# Patient Record
Sex: Female | Born: 1990 | Hispanic: Refuse to answer | Marital: Single | Smoking: Former smoker
Health system: Southern US, Community
[De-identification: ages and names within clinical notes are randomized; demographics above are authoritative.]

## PROBLEM LIST (undated history)

## (undated) DIAGNOSIS — J45909 Unspecified asthma, uncomplicated: Secondary | ICD-10-CM

## (undated) DIAGNOSIS — M797 Fibromyalgia: Secondary | ICD-10-CM

## (undated) DIAGNOSIS — F988 Other specified behavioral and emotional disorders with onset usually occurring in childhood and adolescence: Secondary | ICD-10-CM

## (undated) HISTORY — PX: NO PAST SURGERIES: SHX2092

---

## 2019-10-31 ENCOUNTER — Encounter: Payer: Self-pay | Admitting: Emergency Medicine

## 2019-10-31 ENCOUNTER — Ambulatory Visit
Admission: EM | Admit: 2019-10-31 | Discharge: 2019-10-31 | Disposition: A | Discharge status: Home / Self Care | Payer: Self-pay | Attending: Emergency Medicine | Admitting: Emergency Medicine

## 2019-10-31 ENCOUNTER — Other Ambulatory Visit: Payer: Self-pay

## 2019-10-31 DIAGNOSIS — S0502XA Injury of conjunctiva and corneal abrasion without foreign body, left eye, initial encounter: Secondary | ICD-10-CM

## 2019-10-31 HISTORY — DX: Fibromyalgia: M79.7

## 2019-10-31 MED ORDER — ERYTHROMYCIN 5 MG/GM OP OINT: TOPICAL_OINTMENT | OPHTHALMIC | 0 refills | Status: DC

## 2019-10-31 NOTE — ED Triage Notes (Signed)
Seen  by provider only

## 2019-10-31 NOTE — Discharge Instructions (Addendum)
Use eyedrops as directed on eye(s) as prescribed.  May use artificial tear gel/drops at needed. °Important to use artificial tear gel/drops last and wait 10-15 minutes between drops as it can prevent your prescription drops from working properly.  °Important to follow up with Ophthalmology (eye doctor). °Return sooner for worsening of symptoms, change in vision, sensitivity to light, eye swelling, painful eye movement, or fever.  °

## 2019-10-31 NOTE — ED Provider Notes (Signed)
[facility]    CSN: [id number] Arrival date & time: 10/31/19  1959      History   Chief Complaint Chief Complaint  Patient presents with  . Eye Problem    HPI Eileen Abbott is a 29 y.o. female resenting for left eye pain and irritation, tearing.  Concern for pinkeye given redness.  Has had prolonged exposure with her family members dog, to which she is allergic.  Denies foreign body sensation or exposure.  Does have mild photophobia; no nausea, vomiting, change in vision.  Has not tried thing for this.  Contact lenses: Last use a week ago.    Past Medical History:  Diagnosis Date  . Fibromyalgia     There are no problems to display for this patient.   History reviewed. No pertinent surgical history.  OB History   No obstetric history on file.      Home Medications    Prior to Admission medications   Medication Sig Start Date End Date Taking? Authorizing Provider  DULOXETINE HCL PO Take by mouth.   Yes [provider]  erythromycin ophthalmic ointment Place a 1/2 inch ribbon of ointment into the lower eyelid. 10/31/19   Hall-Potvin, [place], PA-C    Family History History reviewed. No pertinent family history.  Social History Social History   Tobacco Use  . Smoking status: Not on file  Substance Use Topics  . Alcohol use: Not on file  . Drug use: Not on file     Allergies   Patient has no known allergies.   Review of Systems As per HPI   Physical Exam Triage Vital Signs ED Triage Vitals  Enc Vitals Group     BP      Pulse      Resp      Temp      Temp src      SpO2      Weight      Height      Head Circumference      Peak Flow      Pain Score      Pain Loc      Pain Edu?      Excl. in GC?    No data found.  Updated Vital Signs BP (!) 143/92 (BP Location: Left Arm)   Pulse 96   Temp 98.2 F (36.8 C) (Oral)   Resp 18   LMP 10/01/2019   SpO2 97%   Visual Acuity Right Eye Distance:   Left Eye  Distance:   Bilateral Distance:    Right Eye Near:   Left Eye Near:    Bilateral Near:     Physical Exam Constitutional:      General: She is not in acute distress. HENT:     Head: Normocephalic and atraumatic.  Eyes:     General: No scleral icterus.       Right eye: No discharge.        Left eye: Discharge present.    Extraocular Movements: Extraocular movements intact.     Pupils: Pupils are equal, round, and reactive to light.     Comments: Left eye with conjunctival injection, spares limbus.  Watery discharge.  No foreign body with lid eversion.  Fluorescein dye exam with small corneal abrasion mid orbit  Cardiovascular:     Rate and Rhythm: Normal rate.  Pulmonary:     Effort: Pulmonary effort is normal.  Skin:    Coloration: Skin is not jaundiced or  pale.  Neurological:     Mental Status: She is alert and oriented to person, place, and time.      UC Treatments / Results  Labs (all labs ordered are listed, but only abnormal results are displayed) Labs Reviewed - No data to display  EKG   Radiology No results found.  Procedures Procedures (including critical care time)  Medications Ordered in UC Medications - No data to display  Initial Impression / Assessment and Plan / UC Course  I have reviewed the triage vital signs and the nursing notes.  Pertinent labs & imaging results that were available during my care of the patient were reviewed by me and considered in my medical decision making (see chart for details).     H&P concerning regarding abrasion: We will treat with erythromycin, patient follow-up with ophthalmology tomorrow.  Return precautions discussed, pt verbalized understanding and is agreeable to plan. Final Clinical Impressions(s) / UC Diagnoses   Final diagnoses:  Abrasion of left cornea, initial encounter     Discharge Instructions     Use eyedrops as directed on eye(s) as prescribed.  May use artificial tear gel/drops at  needed. Important to use artificial tear gel/drops last and wait 10-15 minutes between drops as it can prevent your prescription drops from working properly.  Important to follow up with Ophthalmology (eye doctor). Return sooner for worsening of symptoms, change in vision, sensitivity to light, eye swelling, painful eye movement, or fever.     ED Prescriptions    Medication Sig Dispense Auth. Provider   erythromycin ophthalmic ointment Place a 1/2 inch ribbon of ointment into the lower eyelid. 3.5 g Hall-Potvin, [place], PA-C     PDMP not reviewed this encounter.   Hall-Potvin, [place], [place] 10/31/19 2056

## 2019-11-08 ENCOUNTER — Encounter (HOSPITAL_COMMUNITY): Payer: Self-pay | Admitting: Family Medicine

## 2019-11-08 ENCOUNTER — Inpatient Hospital Stay (HOSPITAL_COMMUNITY): Payer: Medicaid Other

## 2019-11-08 ENCOUNTER — Inpatient Hospital Stay (HOSPITAL_COMMUNITY)
Admission: AD | Admit: 2019-11-08 | Discharge: 2019-11-08 | Disposition: A | Discharge status: Home / Self Care | Payer: Medicaid Other | Attending: Family Medicine | Admitting: Family Medicine

## 2019-11-08 ENCOUNTER — Other Ambulatory Visit: Payer: Self-pay

## 2019-11-08 DIAGNOSIS — O26891 Other specified pregnancy related conditions, first trimester: Secondary | ICD-10-CM | POA: Diagnosis not present

## 2019-11-08 DIAGNOSIS — O3680X0 Pregnancy with inconclusive fetal viability, not applicable or unspecified: Secondary | ICD-10-CM

## 2019-11-08 DIAGNOSIS — R1013 Epigastric pain: Secondary | ICD-10-CM | POA: Diagnosis not present

## 2019-11-08 DIAGNOSIS — O99891 Other specified diseases and conditions complicating pregnancy: Secondary | ICD-10-CM | POA: Diagnosis not present

## 2019-11-08 DIAGNOSIS — M797 Fibromyalgia: Secondary | ICD-10-CM | POA: Insufficient documentation

## 2019-11-08 DIAGNOSIS — O219 Vomiting of pregnancy, unspecified: Secondary | ICD-10-CM | POA: Diagnosis not present

## 2019-11-08 DIAGNOSIS — Z3A09 9 weeks gestation of pregnancy: Secondary | ICD-10-CM | POA: Insufficient documentation

## 2019-11-08 DIAGNOSIS — Z6791 Unspecified blood type, Rh negative: Secondary | ICD-10-CM

## 2019-11-08 DIAGNOSIS — Z79899 Other long term (current) drug therapy: Secondary | ICD-10-CM | POA: Insufficient documentation

## 2019-11-08 DIAGNOSIS — O26899 Other specified pregnancy related conditions, unspecified trimester: Secondary | ICD-10-CM

## 2019-11-08 DIAGNOSIS — R109 Unspecified abdominal pain: Secondary | ICD-10-CM

## 2019-11-08 DIAGNOSIS — O99511 Diseases of the respiratory system complicating pregnancy, first trimester: Secondary | ICD-10-CM | POA: Insufficient documentation

## 2019-11-08 DIAGNOSIS — J45909 Unspecified asthma, uncomplicated: Secondary | ICD-10-CM | POA: Insufficient documentation

## 2019-11-08 HISTORY — DX: Unspecified asthma, uncomplicated: J45.909

## 2019-11-08 LAB — COMPREHENSIVE METABOLIC PANEL
ALT: 16 U/L (ref 0–44)
AST: 16 U/L (ref 15–41)
Albumin: 3.3 g/dL — ABNORMAL LOW (ref 3.5–5.0)
Alkaline Phosphatase: 35 U/L — ABNORMAL LOW (ref 38–126)
Anion gap: 8 (ref 5–15)
BUN: 10 mg/dL (ref 6–20)
CO2: 25 mmol/L (ref 22–32)
Calcium: 8.7 mg/dL — ABNORMAL LOW (ref 8.9–10.3)
Chloride: 105 mmol/L (ref 98–111)
Creatinine, Ser: 0.8 mg/dL (ref 0.44–1.00)
GFR calc Af Amer: >60 mL/min (ref >60)
GFR calc non Af Amer: >60 mL/min (ref >60)
Glucose, Bld: 90 mg/dL (ref 70–99)
Potassium: 3.6 mmol/L (ref 3.5–5.1)
Sodium: 138 mmol/L (ref 135–145)
Total Bilirubin: 0.5 mg/dL (ref 0.3–1.2)
Total Protein: 6 g/dL — ABNORMAL LOW (ref 6.5–8.1)

## 2019-11-08 LAB — POCT PREGNANCY, URINE: Preg Test, Ur: POSITIVE — AB

## 2019-11-08 LAB — CBC
HCT: 39.8 % (ref 36.0–46.0)
Hemoglobin: 13.2 g/dL (ref 12.0–15.0)
MCH: 30.3 pg (ref 26.0–34.0)
MCHC: 33.2 g/dL (ref 30.0–36.0)
MCV: 91.5 fL (ref 80.0–100.0)
Platelets: 223 10*3/uL (ref 150–400)
RBC: 4.35 MIL/uL (ref 3.87–5.11)
RDW: 12.3 % (ref 11.5–15.5)
WBC: 8.7 10*3/uL (ref 4.0–10.5)
nRBC: 0 % (ref 0.0–0.2)

## 2019-11-08 LAB — URINALYSIS, ROUTINE W REFLEX MICROSCOPIC
Bilirubin Urine: NEGATIVE
Glucose, UA: NEGATIVE mg/dL
Hgb urine dipstick: NEGATIVE
Ketones, ur: NEGATIVE mg/dL
Leukocytes,Ua: NEGATIVE
Nitrite: NEGATIVE
Protein, ur: NEGATIVE mg/dL
Specific Gravity, Urine: 1.019 (ref 1.005–1.030)
pH: 7 (ref 5.0–8.0)

## 2019-11-08 LAB — WET PREP, GENITAL
Clue Cells Wet Prep HPF POC: NONE SEEN
Sperm: NONE SEEN
Trich, Wet Prep: NONE SEEN
Yeast Wet Prep HPF POC: NONE SEEN

## 2019-11-08 LAB — HCG, QUANTITATIVE, PREGNANCY: hCG, Beta Chain, Quant, S: 972 m[IU]/mL — ABNORMAL HIGH (ref <5)

## 2019-11-08 LAB — ABO/RH: ABO/RH(D): B NEG

## 2019-11-08 MED ORDER — PROMETHAZINE HCL 12.5 MG PO TABS: 12.5000 mg | ORAL_TABLET | Freq: Four times a day (QID) | ORAL | 0 refills | Status: DC | PRN

## 2019-11-08 MED ORDER — DOXYLAMINE SUCCINATE (SLEEP) 25 MG PO TABS: 25.0000 mg | ORAL_TABLET | Freq: Three times a day (TID) | ORAL | 0 refills | Status: DC | PRN

## 2019-11-08 MED ORDER — PYRIDOXINE HCL 25 MG PO TABS: 25.0000 mg | ORAL_TABLET | Freq: Three times a day (TID) | ORAL | 0 refills | Status: DC

## 2019-11-08 NOTE — MAU Note (Signed)
.   Annelle B Feldmeier is a 29 y.o. at [redacted]w[redacted]d here in MAU reporting: she has been having N/V and pain in her right lower abdomen on and off. Denies any VB LMP: 09/01/19 Onset of complaint: ongoing Pain score: 6 Vitals:   11/08/19 1146  BP: 126/88  Pulse: 85  Resp: 16  Temp: 98.4 F (36.9 C)  SpO2: 100%     FHT: Lab orders placed from triage: UPT/UA

## 2019-11-08 NOTE — MAU Provider Note (Signed)
History     CSN: [id number]  Arrival date and time: 11/08/19 1053  First Provider Initiated Contact with Patient 11/08/19 1225      Chief Complaint  Patient presents with  . Possible Pregnancy  . Emesis  . Abdominal Pain   HPI Eileen Abbott is a 29 y.o. G1P0 at [redacted]w[redacted]d by uncertain LMP who presents to MAU with chief complaint of epigastric and right mid abdominal pain. These are recurrent problems, onset within the past two weeks. She rates her pain as 6/10, non-radiating. She denies aggravating or alleviating factors. She has not taken medication or tried other treatments for this complaint. She declines pain medication in MAU.  Patient also c/o nausea and vomiting. She can intermittently tolerate PO intake but vomits each day. She has not taken medication or tried other treatments for this complaint.   She denies vaginal bleeding, abdominal tenderness, dysuria, fever or recent illness.  OB History    Gravida  1   Para      Term      Preterm      AB      Living        SAB      TAB      Ectopic      Multiple      Live Births              Past Medical History:  Diagnosis Date  . Asthma   . Fibromyalgia     History reviewed. No pertinent surgical history.  History reviewed. No pertinent family history.  Social History   Tobacco Use  . Smoking status: Never Smoker  . Smokeless tobacco: Never Used  Substance Use Topics  . Alcohol use: Not on file  . Drug use: Yes    Types: Marijuana    Allergies:  Allergies  Allergen Reactions  . Banana Nausea And Vomiting  . Eggs Or Egg-Derived Products Nausea And Vomiting    Medications Prior to Admission  Medication Sig Dispense Refill Last Dose  . DULOXETINE HCL PO Take by mouth.   11/07/2019 at Unknown time  . folic acid (FOLVITE) 1 MG tablet Take 1 mg by mouth daily.   11/07/2019 at Unknown time  . Prenatal Vit-Fe Fumarate-FA (PRENATAL MULTIVITAMIN) TABS tablet Take 1 tablet by mouth daily at 12  noon.   11/07/2019 at Unknown time  . erythromycin ophthalmic ointment Place a 1/2 inch ribbon of ointment into the lower eyelid. 3.5 g 0     Review of Systems  Gastrointestinal: Positive for abdominal pain, nausea and vomiting.  Genitourinary: Negative for vaginal bleeding.  Musculoskeletal: Negative for back pain.  All other systems reviewed and are negative.  Physical Exam   Blood pressure 126/88, pulse 85, temperature 98.4 F (36.9 C), resp. rate 16, height 5' (1.524 m), weight 55.8 kg, last menstrual period 09/01/2019, SpO2 100 %.  Physical Exam Vitals and nursing note reviewed. Exam conducted with a chaperone present.  Cardiovascular:     Rate and Rhythm: Normal rate.     Heart sounds: Normal heart sounds.  Pulmonary:     Effort: Pulmonary effort is normal.     Breath sounds: Normal breath sounds.  Abdominal:     General: Abdomen is flat.     Palpations: Abdomen is soft.     Tenderness: There is no abdominal tenderness. There is no right CVA tenderness or left CVA tenderness.  Genitourinary:    Comments: Speculum exam deferred due to absence of bleeding  Skin:    General: Skin is warm and dry.  Neurological:     Mental Status: She is alert.     MAU Course  Procedures   Patient Vitals for the past 24 hrs:  BP Temp Pulse Resp SpO2 Height Weight  11/08/19 1358 117/89 -- 84 16 -- -- --  11/08/19 1146 126/88 98.4 F (36.9 C) 85 16 100 % 5' (1.524 m) 55.8 kg   Results for orders placed or performed during the hospital encounter of 11/08/19 (from the past 24 hour(s))  Pregnancy, urine POC     Status: Abnormal   Collection Time: 11/08/19 11:21 AM  Result Value Ref Range   Preg Test, Ur POSITIVE (A) NEGATIVE  Urinalysis, Routine w reflex microscopic Urine, Clean Catch     Status: Abnormal   Collection Time: 11/08/19 11:22 AM  Result Value Ref Range   Color, Urine YELLOW YELLOW   APPearance HAZY (A) CLEAR   Specific Gravity, Urine 1.019 1.005 - 1.030   pH 7.0 5.0 -  8.0   Glucose, UA NEGATIVE NEGATIVE mg/dL   Hgb urine dipstick NEGATIVE NEGATIVE   Bilirubin Urine NEGATIVE NEGATIVE   Ketones, ur NEGATIVE NEGATIVE mg/dL   Protein, ur NEGATIVE NEGATIVE mg/dL   Nitrite NEGATIVE NEGATIVE   Leukocytes,Ua NEGATIVE NEGATIVE  CBC     Status: None   Collection Time: 11/08/19 12:43 PM  Result Value Ref Range   WBC 8.7 4.0 - 10.5 K/uL   RBC 4.35 3.87 - 5.11 MIL/uL   Hemoglobin 13.2 12.0 - 15.0 g/dL   HCT 14.4 36 - 46 %   MCV 91.5 80.0 - 100.0 fL   MCH 30.3 26.0 - 34.0 pg   MCHC 33.2 30.0 - 36.0 g/dL   RDW 81.8 56.3 - 14.9 %   Platelets 223 150 - 400 K/uL   nRBC 0.0 0.0 - 0.2 %  Comprehensive metabolic panel     Status: Abnormal   Collection Time: 11/08/19 12:43 PM  Result Value Ref Range   Sodium 138 135 - 145 mmol/L   Potassium 3.6 3.5 - 5.1 mmol/L   Chloride 105 98 - 111 mmol/L   CO2 25 22 - 32 mmol/L   Glucose, Bld 90 70 - 99 mg/dL   BUN 10 6 - 20 mg/dL   Creatinine, Ser 7.02 0.44 - 1.00 mg/dL   Calcium 8.7 (L) 8.9 - 10.3 mg/dL   Total Protein 6.0 (L) 6.5 - 8.1 g/dL   Albumin 3.3 (L) 3.5 - 5.0 g/dL   AST 16 15 - 41 U/L   ALT 16 0 - 44 U/L   Alkaline Phosphatase 35 (L) 38 - 126 U/L   Total Bilirubin 0.5 0.3 - 1.2 mg/dL   GFR calc non Af Amer >60 >60 mL/min   GFR calc Af Amer >60 >60 mL/min   Anion gap 8 5 - 15  ABO/Rh     Status: None   Collection Time: 11/08/19 12:43 PM  Result Value Ref Range   ABO/RH(D)      B NEG Performed at [facility], [address] [place], [place] [address]   hCG, quantitative, pregnancy     Status: Abnormal   Collection Time: 11/08/19 12:43 PM  Result Value Ref Range   hCG, Beta Chain, Quant, S 972 (H) <5 mIU/mL  Wet prep, genital     Status: Abnormal   Collection Time: 11/08/19  1:01 PM   Specimen: Vaginal  Result Value Ref Range   Yeast Wet  Prep HPF POC NONE SEEN NONE SEEN   Trich, Wet Prep NONE SEEN NONE SEEN   Clue Cells Wet Prep HPF POC NONE SEEN NONE SEEN   WBC, Wet Prep HPF POC FEW (A)  NONE SEEN   Sperm NONE SEEN    US OB LESS THAN 14 WEEKS WITH OB TRANSVAGINAL  Result Date: 11/08/2019 CLINICAL DATA:  First trimester pregnancy, abdominal cramping, LMP 09/01/2019 EXAM: OBSTETRIC <14 WK [place] AND TRANSVAGINAL OB US TECHNIQUE: Both transabdominal and transvaginal ultrasound examinations were performed for complete evaluation of the gestation as well as the maternal uterus, adnexal regions, and pelvic cul-de-sac. Transvaginal technique was performed to assess early pregnancy. COMPARISON:  None FINDINGS: Intrauterine gestational sac: Present, single, tiny Yolk sac:  Not identified Embryo:  Not identified Cardiac Activity: N/A Heart Rate: N/A  bpm MSD: 2.5 mm   4 w   6 d Subchorionic hemorrhage:  None visualized. Maternal uterus/adnexae: RIGHT ovary measures 2.8 x 4.1 x 2.4 cm and contains a small corpus luteum. LEFT ovary normal size and morphology 3.6 x 1.6 x 1.7 cm. Maternal uterus anteverted and otherwise unremarkable. No free pelvic fluid or adnexal masses. IMPRESSION: Tiny gestational sac in uterus with mean sac diameter corresponding to 4 weeks 6 days EGA. No fetal pole identified to establish viability; if clinically indicated, follow-up imaging can be performed in 14 days to assess viability. Electronically Signed   By: Ulyses Southward M.D.   On: 11/08/2019 13:32   Meds ordered this encounter  Medications  . pyridOXINE (VITAMIN B-6) 25 MG tablet    Sig: Take 1 tablet (25 mg total) by mouth every 8 (eight) hours.    Dispense:  30 tablet    Refill:  0    Order Specific Question:   Supervising Provider    Answer:   Reva Bores [[id number]]  . doxylamine, Sleep, (UNISOM) 25 MG tablet    Sig: Take 1 tablet (25 mg total) by mouth every 8 (eight) hours as needed.    Dispense:  30 tablet    Refill:  0    Order Specific Question:   Supervising Provider    Answer:   Reva Bores [[id number]]  . promethazine (PHENERGAN) 12.5 MG tablet    Sig: Take 1 tablet (12.5 mg total) by mouth every 6 (six)  hours as needed for nausea or vomiting. For vomiting not relieved by B6 and Unisom    Dispense:  30 tablet    Refill:  0    Order Specific Question:   Supervising Provider    Answer:   Reva Bores [[id number]]    Assessment and Plan  --29 y.o. G1P0 with pregnancy of unknown location --Quant hCG 972 --Rh NEG, not bleeding, Rhogam not indicated --New rx antiemetics --Discharge home in stable condition with ectopic precautions  F/U: --Patient to return to MAU Sunday 08/29 for Stat Quant hCG  Calvert Cantor, CNM 11/08/2019, 4:19 PM

## 2019-11-08 NOTE — Discharge Instructions (Signed)
Human Chorionic Gonadotropin Test Why am I having this test? A human chorionic gonadotropin (hCG) test is done to determine whether you are pregnant. It can also be used:  To diagnose an abnormal pregnancy.  To determine whether you have had a failed pregnancy (miscarriage) or are at risk of one. What is being tested? This test checks the level of the human chorionic gonadotropin (hCG) hormone in the blood. This hormone is produced during pregnancy by the cells that form the placenta. The placenta is the organ that grows inside your womb (uterus) to nourish a developing baby. When you are pregnant, hCG can be detected in your blood or urine 7 to 8 days before your missed period. It continues to go up for the first 8-10 weeks of pregnancy. The presence of hCG in your blood can be measured with several different types of tests. You may have:  A urine test. ? Because this hormone is eliminated from your body by your kidneys, you may have a urine test to find out whether you are pregnant. A home pregnancy test detects whether there is hCG in your urine. ? A urine test only shows whether there is hCG in your urine. It does not measure how much.  A qualitative blood test. ? You may have this type of blood test to find out if you are pregnant. ? This blood test only shows whether there is hCG in your blood. It does not measure how much.  A quantitative blood test. ? This type of blood test measures the amount of hCG in your blood. ? You may have this test to:  Diagnose an abnormal pregnancy.  Check whether you have had a miscarriage.  Determine whether you are at risk of a miscarriage. What kind of sample is taken?     Two kinds of samples may be collected to test for the hCG hormone.  Blood. It is usually collected by inserting a needle into a blood vessel.  Urine. It is usually collected by urinating into a germ-free (sterile) specimen cup. It is best to collect the sample the first  time you urinate in the morning. How do I prepare for this test? No preparation is needed for a blood test.  For the urine test:  Let your health care provider know about: ? All medicines you are taking, including vitamins, herbs, creams, and over-the-counter medicines. ? Any blood in your urine. This may interfere with the result.  Do not drink too much fluid. Drink as you normally would, or as directed by your health care provider. How are the results reported? Depending on the type of test that you have, your test results may be reported as values. Your health care provider will compare your results to normal ranges that were established after testing a large group of people (reference ranges). Reference ranges may vary among labs and hospitals. For this test, common reference ranges that show absence of pregnancy are:  Quantitative hCG blood levels: less than 5 IU/L. Other results will be reported as either positive or negative. For this test, normal results (meaning the absence of pregnancy) are:  Negative for hCG in the urine test.  Negative for hCG in the qualitative blood test. What do the results mean? Urine and qualitative blood test  A negative result could mean: ? That you are not pregnant. ? That the test was done too early in your pregnancy to detect hCG in your blood or urine. If you still have other signs   of pregnancy, the test will be repeated.  A positive result means: ? That you are most likely pregnant. Your health care provider may confirm your pregnancy with an imaging study (ultrasound) of your uterus, if needed. Quantitative blood test Results of the quantitative hCG blood test will be interpreted as follows:  Less than 5 IU/L: You are most likely not pregnant.  Greater than 25 IU/L: You are most likely pregnant.  hCG levels that are higher than expected: ? You are pregnant with twins. ? You have abnormal growths in the uterus.  hCG levels that are  rising more slowly than expected: ? You have an ectopic pregnancy (also called a tubal pregnancy).  hCG levels that are falling: ? You may be having a miscarriage. Talk with your health care provider about what your results mean. Questions to ask your health care provider Ask your health care provider, or the department that is doing the test:  When will my results be ready?  How will I get my results?  What are my treatment options?  What other tests do I need?  What are my next steps? Summary  A human chorionic gonadotropin test is done to determine whether you are pregnant.  When you are pregnant, hCG can be detected in your blood or urine 7 to 8 days before your missed period. It continues to go up for the first 8-10 weeks of pregnancy.  Your hCG level can be measured with different types of tests. You may have a urine test, a qualitative blood test, or a quantitative blood test.  Talk with your health care provider about what your results mean. This information is not intended to replace advice given to you by your health care provider. Make sure you discuss any questions you have with your health care provider. Document Revised: 01/30/2017 Document Reviewed: 01/30/2017 Elsevier Patient Education  2020 [employer]vier Inc.  

## 2019-11-11 ENCOUNTER — Inpatient Hospital Stay (HOSPITAL_COMMUNITY)
Admission: AD | Admit: 2019-11-11 | Discharge: 2019-11-11 | Disposition: A | Discharge status: Home / Self Care | Payer: Medicaid Other | Attending: Obstetrics and Gynecology | Admitting: Obstetrics and Gynecology

## 2019-11-11 DIAGNOSIS — O26891 Other specified pregnancy related conditions, first trimester: Secondary | ICD-10-CM | POA: Insufficient documentation

## 2019-11-11 DIAGNOSIS — Z3A01 Less than 8 weeks gestation of pregnancy: Secondary | ICD-10-CM | POA: Diagnosis not present

## 2019-11-11 DIAGNOSIS — Z3A00 Weeks of gestation of pregnancy not specified: Secondary | ICD-10-CM

## 2019-11-11 DIAGNOSIS — O3680X0 Pregnancy with inconclusive fetal viability, not applicable or unspecified: Secondary | ICD-10-CM | POA: Diagnosis not present

## 2019-11-11 DIAGNOSIS — R141 Gas pain: Secondary | ICD-10-CM | POA: Diagnosis not present

## 2019-11-11 LAB — HCG, QUANTITATIVE, PREGNANCY: hCG, Beta Chain, Quant, S: 5418 m[IU]/mL — ABNORMAL HIGH (ref <5)

## 2019-11-11 LAB — GC/CHLAMYDIA PROBE AMP (~~LOC~~) NOT AT ARMC
Chlamydia: NEGATIVE
Comment: NEGATIVE
Comment: NORMAL
Neisseria Gonorrhea: NEGATIVE

## 2019-11-11 NOTE — MAU Note (Signed)
Her for repeat BHCG. Pt reports has some gas pains yesterday. No pain today. Reports a lot of N/V on and off. No vag bleeding.

## 2019-11-11 NOTE — Discharge Instructions (Signed)
Human Chorionic Gonadotropin Test Why am I having this test? A human chorionic gonadotropin (hCG) test is done to determine whether you are pregnant. It can also be used:  To diagnose an abnormal pregnancy.  To determine whether you have had a failed pregnancy (miscarriage) or are at risk of one. What is being tested? This test checks the level of the human chorionic gonadotropin (hCG) hormone in the blood. This hormone is produced during pregnancy by the cells that form the placenta. The placenta is the organ that grows inside your womb (uterus) to nourish a developing baby. When you are pregnant, hCG can be detected in your blood or urine 7 to 8 days before your missed period. It continues to go up for the first 8-10 weeks of pregnancy. The presence of hCG in your blood can be measured with several different types of tests. You may have:  A urine test. ? Because this hormone is eliminated from your body by your kidneys, you may have a urine test to find out whether you are pregnant. A home pregnancy test detects whether there is hCG in your urine. ? A urine test only shows whether there is hCG in your urine. It does not measure how much.  A qualitative blood test. ? You may have this type of blood test to find out if you are pregnant. ? This blood test only shows whether there is hCG in your blood. It does not measure how much.  A quantitative blood test. ? This type of blood test measures the amount of hCG in your blood. ? You may have this test to:  Diagnose an abnormal pregnancy.  Check whether you have had a miscarriage.  Determine whether you are at risk of a miscarriage. What kind of sample is taken?     Two kinds of samples may be collected to test for the hCG hormone.  Blood. It is usually collected by inserting a needle into a blood vessel.  Urine. It is usually collected by urinating into a germ-free (sterile) specimen cup. It is best to collect the sample the first  time you urinate in the morning. How do I prepare for this test? No preparation is needed for a blood test.  For the urine test:  Let your health care provider know about: ? All medicines you are taking, including vitamins, herbs, creams, and over-the-counter medicines. ? Any blood in your urine. This may interfere with the result.  Do not drink too much fluid. Drink as you normally would, or as directed by your health care provider. How are the results reported? Depending on the type of test that you have, your test results may be reported as values. Your health care provider will compare your results to normal ranges that were established after testing a large group of people (reference ranges). Reference ranges may vary among labs and hospitals. For this test, common reference ranges that show absence of pregnancy are:  Quantitative hCG blood levels: less than 5 IU/L. Other results will be reported as either positive or negative. For this test, normal results (meaning the absence of pregnancy) are:  Negative for hCG in the urine test.  Negative for hCG in the qualitative blood test. What do the results mean? Urine and qualitative blood test  A negative result could mean: ? That you are not pregnant. ? That the test was done too early in your pregnancy to detect hCG in your blood or urine. If you still have other signs   of pregnancy, the test will be repeated.  A positive result means: ? That you are most likely pregnant. Your health care provider may confirm your pregnancy with an imaging study (ultrasound) of your uterus, if needed. Quantitative blood test Results of the quantitative hCG blood test will be interpreted as follows:  Less than 5 IU/L: You are most likely not pregnant.  Greater than 25 IU/L: You are most likely pregnant.  hCG levels that are higher than expected: ? You are pregnant with twins. ? You have abnormal growths in the uterus.  hCG levels that are  rising more slowly than expected: ? You have an ectopic pregnancy (also called a tubal pregnancy).  hCG levels that are falling: ? You may be having a miscarriage. Talk with your health care provider about what your results mean. Questions to ask your health care provider Ask your health care provider, or the department that is doing the test:  When will my results be ready?  How will I get my results?  What are my treatment options?  What other tests do I need?  What are my next steps? Summary  A human chorionic gonadotropin test is done to determine whether you are pregnant.  When you are pregnant, hCG can be detected in your blood or urine 7 to 8 days before your missed period. It continues to go up for the first 8-10 weeks of pregnancy.  Your hCG level can be measured with different types of tests. You may have a urine test, a qualitative blood test, or a quantitative blood test.  Talk with your health care provider about what your results mean. This information is not intended to replace advice given to you by your health care provider. Make sure you discuss any questions you have with your health care provider. Document Revised: 01/30/2017 Document Reviewed: 01/30/2017 Elsevier Patient Education  2020 [employer]vier Inc.  

## 2019-11-11 NOTE — MAU Provider Note (Signed)
First Provider Initiated Contact with Patient 11/11/19 1628     S Ms. Eileen Abbott is a 29 y.o. G1P0 female with pregnancy of unknown location who presents to MAU today for repeat stat Quant hCG. She denies pain or bleeding.   O BP 114/64   Pulse 92   Temp 98.1 F (36.7 C)   Resp 18   LMP 09/01/2019    Physical Exam Vitals and nursing note reviewed. Exam conducted with a chaperone present.  Cardiovascular:     Rate and Rhythm: Normal rate.     Pulses: Normal pulses.  Pulmonary:     Effort: Pulmonary effort is normal.  Skin:    General: Skin is warm and dry.     Capillary Refill: Capillary refill takes less than 2 seconds.  Psychiatric:        Mood and Affect: Mood normal.    A Medical screening exam complete Quant hCG 972 on 11/08/2019 Quant hCG today 5418, appropriate rise  P Discharge from MAU in stable condition Patient may return to MAU as needed for pregnancy related complaints  F/U: Repeat viability scan ordered for two weeks from now at [facility]  Clayton Bibles, [place] 11/11/2019 5:33 PM

## 2019-11-16 ENCOUNTER — Other Ambulatory Visit: Payer: Self-pay

## 2019-11-16 ENCOUNTER — Ambulatory Visit
Admission: EM | Admit: 2019-11-16 | Discharge: 2019-11-16 | Disposition: A | Discharge status: Home / Self Care | Payer: Medicaid Other | Attending: Physician Assistant | Admitting: Physician Assistant

## 2019-11-16 DIAGNOSIS — R059 Cough, unspecified: Secondary | ICD-10-CM

## 2019-11-16 DIAGNOSIS — J3489 Other specified disorders of nose and nasal sinuses: Secondary | ICD-10-CM

## 2019-11-16 DIAGNOSIS — J45901 Unspecified asthma with (acute) exacerbation: Secondary | ICD-10-CM

## 2019-11-16 DIAGNOSIS — Z1152 Encounter for screening for COVID-19: Secondary | ICD-10-CM | POA: Diagnosis not present

## 2019-11-16 MED ORDER — ALBUTEROL SULFATE (2.5 MG/3ML) 0.083% IN NEBU: 2.5000 mg | INHALATION_SOLUTION | Freq: Four times a day (QID) | RESPIRATORY_TRACT | 0 refills | Status: DC | PRN

## 2019-11-16 MED ORDER — AEROCHAMBER PLUS FLO-VU MEDIUM MISC
1.0000 | Freq: Once | Status: AC
  Administered 2019-11-16: 1

## 2019-11-16 MED ORDER — FLUTICASONE PROPIONATE 50 MCG/ACT NA SUSP: 2.0000 | Freq: Every day | NASAL | 0 refills | Status: DC

## 2019-11-16 NOTE — ED Triage Notes (Addendum)
Pt c/o productive cough with green sputum, fatigue,  sore throat, congestion, runny nose, SOB for approx 2 days.  Used albuterol inhaler at approx 1000 today without spacer.  Also c/o lower pelvic/abdominal pain for 2 days. Pt states she had telephone consult with OB regarding lower pelvic pain and light pink vaginal bleeding episode and was advised she was experiencing round ligament pain.  Pt states she only had one episode of light pink bleeding and it resolved quickly. Pt reports pelvic discomfort is improving.  Has been using vick's vapor rub and cough drops. Bilateral lung sounds with insp/exp wheezes. Denies fever, chills, n/v/d, CP, ear pain.

## 2019-11-16 NOTE — ED Provider Notes (Signed)
[facility]    CSN: [id number] Arrival date & time: 11/16/19  1038      History   Chief Complaint Chief Complaint  Patient presents with  . Cough  . Shortness of Breath    HPI Eileen Abbott is a 29 y.o. female.   29 year old female who is [redacted] weeks pregnant comes in for 2 day of URI symptoms.  Rhinorrhea, nasal congestion, sore throat, cough.  Denies fever, chills, body aches.  Has had low abdominal pain/pelvic pain that has been consulted by OB, and cleared for monitoring.  Nausea/vomiting related to pregnancy, controlled with promethazine.  Mild diarrhea that has since resolved.  Denies chest pain, loss of taste or smell.  States shortness of breath similar to asthma exacerbation, does not feel that she is getting sufficient dosage of albuterol inhaler without a spacer.     Past Medical History:  Diagnosis Date  . Asthma   . Fibromyalgia     Patient Active Problem List   Diagnosis Date Noted  . Rh negative status during pregnancy 11/08/2019  . Pregnancy of unknown anatomic location 11/08/2019    History reviewed. No pertinent surgical history.  OB History    Gravida  1   Para      Term      Preterm      AB      Living        SAB      TAB      Ectopic      Multiple      Live Births               Home Medications    Prior to Admission medications   Medication Sig Start Date End Date Taking? Authorizing Provider  folic acid (FOLVITE) 1 MG tablet Take 1 mg by mouth daily.   Yes [provider]  Prenatal Vit-Fe Fumarate-FA (PRENATAL MULTIVITAMIN) TABS tablet Take 1 tablet by mouth daily at 12 noon.   Yes [provider]  promethazine (PHENERGAN) 12.5 MG tablet Take 1 tablet (12.5 mg total) by mouth every 6 (six) hours as needed for nausea or vomiting. For vomiting not relieved by B6 and Unisom 11/08/19  Yes Weinhold, Samantha C, CNM  albuterol (PROVENTIL) (2.5 MG/3ML) 0.083% nebulizer solution Take 3 mLs (2.5 mg  total) by nebulization every 6 (six) hours as needed for wheezing or shortness of breath. 11/16/19   Cathie Hoops, Anneth Brunell V, PA-C  doxylamine, Sleep, (UNISOM) 25 MG tablet Take 1 tablet (25 mg total) by mouth every 8 (eight) hours as needed. 11/08/19   Clayton Bibles C, CNM  DULOXETINE HCL PO Take by mouth.    [provider]  erythromycin ophthalmic ointment Place a 1/2 inch ribbon of ointment into the lower eyelid. 10/31/19   Hall-Potvin, [place], PA-C  fluticasone (FLONASE) 50 MCG/ACT nasal spray Place 2 sprays into both nostrils daily. 11/16/19   Cathie Hoops, Tremaine Fuhriman V, PA-C  pyridOXINE (VITAMIN B-6) 25 MG tablet Take 1 tablet (25 mg total) by mouth every 8 (eight) hours. 11/08/19   Calvert Cantor, CNM    Family History History reviewed. No pertinent family history.  Social History Social History   Tobacco Use  . Smoking status: Never Smoker  . Smokeless tobacco: Never Used  Substance Use Topics  . Alcohol use: Not on file  . Drug use: Yes    Types: Marijuana     Allergies   Banana and Eggs or egg-derived products  Review of Systems Review of Systems  Reason unable to perform ROS: See HPI as above.     Physical Exam Triage Vital Signs ED Triage Vitals  Enc Vitals Group     BP 11/16/19 1214 106/72     Pulse Rate 11/16/19 1214 97     Resp 11/16/19 1214 18     Temp 11/16/19 1214 97.9 F (36.6 C)     Temp Source 11/16/19 1214 Oral     SpO2 11/16/19 1214 97 %     Weight --      Height --      Head Circumference --      Peak Flow --      Pain Score 11/16/19 1228 3     Pain Loc --      Pain Edu? --      Excl. in GC? --    No data found.  Updated Vital Signs BP 106/72 (BP Location: Left Arm)   Pulse 97   Temp 97.9 F (36.6 C) (Oral)   Resp 18   LMP 09/01/2019   SpO2 97%   Physical Exam Constitutional:      General: She is not in acute distress.    Appearance: Normal appearance. She is not ill-appearing, toxic-appearing or diaphoretic.  HENT:     Head:  Normocephalic and atraumatic.     Mouth/Throat:     Mouth: Mucous membranes are moist.     Pharynx: Oropharynx is clear. Uvula midline.  Cardiovascular:     Rate and Rhythm: Normal rate and regular rhythm.     Heart sounds: Normal heart sounds. No murmur heard.  No friction rub. No gallop.   Pulmonary:     Effort: Pulmonary effort is normal. No accessory muscle usage, prolonged expiration, respiratory distress or retractions.     Comments: Lungs clear to auscultation without adventitious lung sounds. Musculoskeletal:     Cervical back: Normal range of motion and neck supple.  Neurological:     General: No focal deficit present.     Mental Status: She is alert and oriented to person, place, and time.      UC Treatments / Results  Labs (all labs ordered are listed, but only abnormal results are displayed) Labs Reviewed  NOVEL CORONAVIRUS, NAA    EKG   Radiology No results found.  Procedures Procedures (including critical care time)  Medications Ordered in UC Medications  AeroChamber Plus Flo-Vu Medium MISC 1 each (has no administration in time range)    Initial Impression / Assessment and Plan / UC Course  I have reviewed the triage vital signs and the nursing notes.  Pertinent labs & imaging results that were available during my care of the patient were reviewed by me and considered in my medical decision making (see chart for details).    Patient afebrile, nontoxic.  Speaking in full sentences without difficulty.  No tachycardia.  Lungs clear to auscultation bilaterally with adventitious lung sounds.  Low suspicion for PE right now, and will have patient continue to monitor closely.  Will provide nebulizer machine/spacer for further management of asthma exacerbation.  Other symptomatic treatment discussed.  Otherwise Covid testing ordered, to quarantine until testing results return.  Strict return precautions given.  Patient expresses understanding and agrees to  plan.  Final Clinical Impressions(s) / UC Diagnoses   Final diagnoses:  Encounter for screening for COVID-19  Asthma with acute exacerbation, unspecified asthma severity, unspecified whether persistent  Rhinorrhea  Cough    ED Prescriptions  Medication Sig Dispense Auth. Provider   albuterol (PROVENTIL) (2.5 MG/3ML) 0.083% nebulizer solution Take 3 mLs (2.5 mg total) by nebulization every 6 (six) hours as needed for wheezing or shortness of breath. 75 mL Tanita Palinkas V, PA-C   fluticasone (FLONASE) 50 MCG/ACT nasal spray Place 2 sprays into both nostrils daily. 1 g Belinda Fisher, PA-C     PDMP not reviewed this encounter.   Belinda Fisher, PA-C 11/16/19 1258

## 2019-11-16 NOTE — Discharge Instructions (Signed)
COVID PCR testing ordered. I would like you to quarantine until testing results. Albuterol as needed for shortness of breath. Flonase for nasal congestion/drainage. Allergy medicine such as zyrtec/claritin if needed. Tylenol/motrin for pain and fever. Keep hydrated, urine should be clear to pale yellow in color. If experiencing worsening shortness of breath, trouble breathing, go to the emergency department for further evaluation needed.

## 2019-11-17 LAB — NOVEL CORONAVIRUS, NAA: SARS-CoV-2, NAA: NOT DETECTED

## 2019-11-27 ENCOUNTER — Other Ambulatory Visit: Payer: Self-pay

## 2019-11-27 ENCOUNTER — Encounter (HOSPITAL_COMMUNITY): Payer: Self-pay | Admitting: Obstetrics & Gynecology

## 2019-11-27 ENCOUNTER — Inpatient Hospital Stay (HOSPITAL_COMMUNITY): Payer: Medicaid Other

## 2019-11-27 ENCOUNTER — Inpatient Hospital Stay (HOSPITAL_COMMUNITY)
Admission: AD | Admit: 2019-11-27 | Discharge: 2019-11-27 | Disposition: A | Discharge status: Home / Self Care | Payer: Medicaid Other | Attending: Obstetrics & Gynecology | Admitting: Obstetrics & Gynecology

## 2019-11-27 DIAGNOSIS — O26891 Other specified pregnancy related conditions, first trimester: Secondary | ICD-10-CM | POA: Diagnosis not present

## 2019-11-27 DIAGNOSIS — F1290 Cannabis use, unspecified, uncomplicated: Secondary | ICD-10-CM | POA: Diagnosis not present

## 2019-11-27 DIAGNOSIS — J45909 Unspecified asthma, uncomplicated: Secondary | ICD-10-CM | POA: Insufficient documentation

## 2019-11-27 DIAGNOSIS — M797 Fibromyalgia: Secondary | ICD-10-CM | POA: Insufficient documentation

## 2019-11-27 DIAGNOSIS — O99351 Diseases of the nervous system complicating pregnancy, first trimester: Secondary | ICD-10-CM | POA: Insufficient documentation

## 2019-11-27 DIAGNOSIS — O99511 Diseases of the respiratory system complicating pregnancy, first trimester: Secondary | ICD-10-CM | POA: Diagnosis not present

## 2019-11-27 DIAGNOSIS — Z79899 Other long term (current) drug therapy: Secondary | ICD-10-CM | POA: Insufficient documentation

## 2019-11-27 DIAGNOSIS — O99891 Other specified diseases and conditions complicating pregnancy: Secondary | ICD-10-CM | POA: Diagnosis not present

## 2019-11-27 DIAGNOSIS — Z3A01 Less than 8 weeks gestation of pregnancy: Secondary | ICD-10-CM | POA: Diagnosis not present

## 2019-11-27 DIAGNOSIS — R102 Pelvic and perineal pain: Secondary | ICD-10-CM | POA: Insufficient documentation

## 2019-11-27 DIAGNOSIS — N930 Postcoital and contact bleeding: Secondary | ICD-10-CM

## 2019-11-27 DIAGNOSIS — O99321 Drug use complicating pregnancy, first trimester: Secondary | ICD-10-CM | POA: Insufficient documentation

## 2019-11-27 DIAGNOSIS — O26851 Spotting complicating pregnancy, first trimester: Secondary | ICD-10-CM | POA: Diagnosis not present

## 2019-11-27 DIAGNOSIS — O209 Hemorrhage in early pregnancy, unspecified: Secondary | ICD-10-CM | POA: Diagnosis not present

## 2019-11-27 HISTORY — DX: Other specified behavioral and emotional disorders with onset usually occurring in childhood and adolescence: F98.8

## 2019-11-27 LAB — URINALYSIS, ROUTINE W REFLEX MICROSCOPIC
Bilirubin Urine: NEGATIVE
Glucose, UA: NEGATIVE mg/dL
Ketones, ur: NEGATIVE mg/dL
Leukocytes,Ua: NEGATIVE
Nitrite: NEGATIVE
Protein, ur: NEGATIVE mg/dL
Specific Gravity, Urine: 1.026 (ref 1.005–1.030)
pH: 5 (ref 5.0–8.0)

## 2019-11-27 MED ORDER — PROMETHAZINE HCL 25 MG PO TABS: 25.0000 mg | ORAL_TABLET | Freq: Four times a day (QID) | ORAL | 2 refills | Status: DC | PRN

## 2019-11-27 NOTE — MAU Note (Signed)
Pt reports spotting all week, has had lower abd pain since yesterday but worsened tonight.

## 2019-11-27 NOTE — MAU Provider Note (Signed)
Chief Complaint: Abdominal Pain   First Provider Initiated Contact with Patient 11/27/19 0147        SUBJECTIVE HPI: Eileen Abbott is a 29 y.o. G1P0 at [redacted]w[redacted]d by LMP who presents to maternity admissions reporting continued spotting, now darker red, and increased intensity of pelvic cramping today.  Was seen 8/30 and a small gestational sac was seen. She was scheduled for a followup US today.  Did have intercourse today  She denies vaginal bleeding, vaginal itching/burning, urinary symptoms, h/a, dizziness, n/v, or fever/chills.    Abdominal Pain This is a recurrent problem. The current episode started 1 to 4 weeks ago. The onset quality is gradual. The problem occurs intermittently. The problem has been unchanged. The pain is located in the LLQ, RLQ and suprapubic region. The pain is moderate. The quality of the pain is cramping. The abdominal pain does not radiate. Associated symptoms include nausea. Pertinent negatives include no constipation, diarrhea, dysuria, fever, frequency, myalgias or vomiting. Nothing aggravates the pain. The pain is relieved by nothing. She has tried nothing for the symptoms.   RN Note: Pt reports spotting all week, has had lower abd pain since yesterday but worsened tonight.   Past Medical History:  Diagnosis Date  . Asthma   . Fibromyalgia    History reviewed. No pertinent surgical history. Social History   Socioeconomic History  . Marital status: Single    Spouse name: Not on file  . Number of children: Not on file  . Years of education: Not on file  . Highest education level: Not on file  Occupational History  . Not on file  Tobacco Use  . Smoking status: Never Smoker  . Smokeless tobacco: Never Used  Substance and Sexual Activity  . Alcohol use: Not on file  . Drug use: Yes    Types: Marijuana  . Sexual activity: Yes  Other Topics Concern  . Not on file  Social History Narrative  . Not on file   Social Determinants of Health    Financial Resource Strain:   . Difficulty of Paying Living Expenses: Not on file  Food Insecurity:   . Worried About Programme researcher, broadcasting/film/video in the Last Year: Not on file  . Ran Out of Food in the Last Year: Not on file  Transportation Needs:   . Lack of Transportation (Medical): Not on file  . Lack of Transportation (Non-Medical): Not on file  Physical Activity:   . Days of Exercise per Week: Not on file  . Minutes of Exercise per Session: Not on file  Stress:   . Feeling of Stress : Not on file  Social Connections:   . Frequency of Communication with Friends and Family: Not on file  . Frequency of Social Gatherings with Friends and Family: Not on file  . Attends Religious Services: Not on file  . Active Member of Clubs or Organizations: Not on file  . Attends Banker Meetings: Not on file  . Marital Status: Not on file  Intimate Partner Violence:   . Fear of Current or Ex-Partner: Not on file  . Emotionally Abused: Not on file  . Physically Abused: Not on file  . Sexually Abused: Not on file   No current facility-administered medications on file prior to encounter.   Current Outpatient Medications on File Prior to Encounter  Medication Sig Dispense Refill  . albuterol (PROVENTIL) (2.5 MG/3ML) 0.083% nebulizer solution Take 3 mLs (2.5 mg total) by nebulization every 6 (six) hours  as needed for wheezing or shortness of breath. 75 mL 0  . doxylamine, Sleep, (UNISOM) 25 MG tablet Take 1 tablet (25 mg total) by mouth every 8 (eight) hours as needed. 30 tablet 0  . DULOXETINE HCL PO Take by mouth.    . erythromycin ophthalmic ointment Place a 1/2 inch ribbon of ointment into the lower eyelid. 3.5 g 0  . fluticasone (FLONASE) 50 MCG/ACT nasal spray Place 2 sprays into both nostrils daily. 1 g 0  . folic acid (FOLVITE) 1 MG tablet Take 1 mg by mouth daily.    . Prenatal Vit-Fe Fumarate-FA (PRENATAL MULTIVITAMIN) TABS tablet Take 1 tablet by mouth daily at 12 noon.    .  promethazine (PHENERGAN) 12.5 MG tablet Take 1 tablet (12.5 mg total) by mouth every 6 (six) hours as needed for nausea or vomiting. For vomiting not relieved by B6 and Unisom 30 tablet 0  . pyridOXINE (VITAMIN B-6) 25 MG tablet Take 1 tablet (25 mg total) by mouth every 8 (eight) hours. 30 tablet 0   Allergies  Allergen Reactions  . Banana Nausea And Vomiting  . Eggs Or Egg-Derived Products Nausea And Vomiting    I have reviewed patient's Past Medical Hx, Surgical Hx, Family Hx, Social Hx, medications and allergies.   ROS:  Review of Systems  Constitutional: Negative for fever.  Gastrointestinal: Positive for abdominal pain and nausea. Negative for constipation, diarrhea and vomiting.  Genitourinary: Negative for dysuria and frequency.  Musculoskeletal: Negative for myalgias.   Review of Systems  Other systems negative   Physical Exam  Physical Exam No data found. Constitutional: Well-developed, well-nourished female in no acute distress.  Cardiovascular: normal rate Respiratory: normal effort GI: Abd soft, non-tender. Pos BS x 4 MS: Extremities nontender, no edema, normal ROM Neurologic: Alert and oriented x 4.  GU: Neg CVAT.  PELVIC EXAM: deferred due to planned [place] tonight  LAB RESULTS Results for orders placed or performed during the hospital encounter of 11/27/19 (from the past 24 hour(s))  Urinalysis, Routine w reflex microscopic     Status: Abnormal   Collection Time: 11/27/19  1:52 AM  Result Value Ref Range   Color, Urine YELLOW YELLOW   APPearance HAZY (A) CLEAR   Specific Gravity, Urine 1.026 1.005 - 1.030   pH 5.0 5.0 - 8.0   Glucose, UA NEGATIVE NEGATIVE mg/dL   Hgb urine dipstick SMALL (A) NEGATIVE   Bilirubin Urine NEGATIVE NEGATIVE   Ketones, ur NEGATIVE NEGATIVE mg/dL   Protein, ur NEGATIVE NEGATIVE mg/dL   Nitrite NEGATIVE NEGATIVE   Leukocytes,Ua NEGATIVE NEGATIVE   RBC / HPF 0-5 0 - 5 RBC/hpf   WBC, UA 0-5 0 - 5 WBC/hpf   Bacteria, UA RARE (A)  NONE SEEN   Squamous Epithelial / LPF 11-20 0 - 5   Mucus PRESENT      --/--/B NEG Performed at [facility], [address] [place], [place] [address]  [phone]/27 [phone])  IMAGING US OB Transvaginal  Result Date: 11/27/2019 CLINICAL DATA:  Spotting EXAM: TRANSVAGINAL OB ULTRASOUND TECHNIQUE: Transvaginal ultrasound was performed for complete evaluation of the gestation as well as the maternal uterus, adnexal regions, and pelvic cul-de-sac. COMPARISON:  11/08/2019 FINDINGS: Intrauterine gestational sac: Single Yolk sac:  Visualized. Embryo:  Visualized. Cardiac Activity: Visualized. Heart Rate: 138 bpm CRL: 10.7 mm   7 w 1 d                  [place] EDC: 07/14/2020 Subchorionic hemorrhage:  None visualized. Maternal uterus/adnexae: Ovaries are within normal limits. The left ovary measures 3.6 x 1.8 x 2.1 cm. The right ovary measures 3.1 x 2.2 x 3.5 cm and contains a corpus luteum. No significant free fluid IMPRESSION: 1. Single viable intrauterine pregnancy with visualized embryo and cardiac activity as above. 2. Otherwise negative examination Electronically Signed   By: Jasmine Pang M.D.   On: 11/27/2019 03:04   US OB LESS THAN 14 WEEKS WITH OB TRANSVAGINAL  Result Date: 11/08/2019 CLINICAL DATA:  First trimester pregnancy, abdominal cramping, LMP 09/01/2019 EXAM: OBSTETRIC <14 WK [place] AND TRANSVAGINAL OB US TECHNIQUE: Both transabdominal and transvaginal ultrasound examinations were performed for complete evaluation of the gestation as well as the maternal uterus, adnexal regions, and pelvic cul-de-sac. Transvaginal technique was performed to assess early pregnancy. COMPARISON:  None FINDINGS: Intrauterine gestational sac: Present, single, tiny Yolk sac:  Not identified Embryo:  Not identified Cardiac Activity: N/A Heart Rate: N/A  bpm MSD: 2.5 mm   4 w   6 d Subchorionic hemorrhage:  None visualized. Maternal uterus/adnexae: RIGHT ovary measures 2.8 x 4.1 x 2.4 cm and contains a small corpus luteum.  LEFT ovary normal size and morphology 3.6 x 1.6 x 1.7 cm. Maternal uterus anteverted and otherwise unremarkable. No free pelvic fluid or adnexal masses. IMPRESSION: Tiny gestational sac in uterus with mean sac diameter corresponding to 4 weeks 6 days EGA. No fetal pole identified to establish viability; if clinically indicated, follow-up imaging can be performed in 14 days to assess viability. Electronically Signed   By: Ulyses Southward M.D.   On: 11/08/2019 13:32     MAU Management/MDM: Ordered Ultrasound to rule out SAB  Reviewed US findings of development of fetus with heartbeat.  No sign of subchorionic hemorrhage.  ASSESSMENT Single IUP at [redacted]w[redacted]d Vaginal bleeding, likely post-coital Pelvic cramping  PLAN Discharge home Advised to start [facility]  Pt stable at time of discharge. Encouraged to return here or to other Urgent Care/ED if she develops worsening of symptoms, increase in pain, fever, or other concerning symptoms.    Wynelle Bourgeois CNM, MSN Certified Nurse-Midwife 11/27/2019  1:48 AM

## 2019-11-27 NOTE — Discharge Instructions (Signed)

## 2019-11-28 ENCOUNTER — Ambulatory Visit: Payer: Medicaid Other

## 2019-12-23 ENCOUNTER — Telehealth (INDEPENDENT_AMBULATORY_CARE_PROVIDER_SITE_OTHER): Payer: Medicaid Other | Admitting: *Deleted

## 2019-12-23 ENCOUNTER — Other Ambulatory Visit: Payer: Self-pay

## 2019-12-23 DIAGNOSIS — Z3490 Encounter for supervision of normal pregnancy, unspecified, unspecified trimester: Secondary | ICD-10-CM | POA: Insufficient documentation

## 2019-12-23 DIAGNOSIS — M797 Fibromyalgia: Secondary | ICD-10-CM | POA: Insufficient documentation

## 2019-12-23 DIAGNOSIS — Z3A01 Less than 8 weeks gestation of pregnancy: Secondary | ICD-10-CM

## 2019-12-23 MED ORDER — BLOOD PRESSURE KIT DEVI: 1.0000 | 0 refills | Status: DC | PRN

## 2019-12-23 NOTE — Patient Instructions (Addendum)
[place] [employer] [facility]  Department of Social Services-[place] [address], [place], [place] [address] [phone]   or  www.[place].gov/our-county/human-services/social-services **SNAP/EBT/ Other nutritional benefits  [place] DHHS-Public Health-WIC [address], [place], [place] [address] [phone]) [phone]  or  https://[place]nc.gov/our-county/human-services/health-department **WIC for  women who are pregnant and postpartum, infants and children up to 5 years old  [facility] [address], [place], [place] [address] [phone]) [phone]   or   www.theblessedtable.org  **Food pantry  [facility] [address], [place], [place] [address] [phone]   or   https://brotherkolbes.godaddysites.com  **Emergency food and prepared meals  [place] [facility] [address], [place], [place] [address] [phone]) [phone]   or   www.cedargrovetop.us **Food pantry  [facility] [address], [place], [place] [address] [phone]   or   www.facebook.com/Celia-[facility]ps-United-[facility]ist-Church-116430931718202 **Food pantry  [facility] [address], [place], [place] [address] [phone] **Food pantry  [facility] [address], [place], [place] [address] [phone]) [phone]   or   www.greensborourbanministry.org  **Food pantry and prepared meals  [facility] [address], [place], [place] [address] https://jfsgreensboro.org/  **Food pantry  [place] [facility] [address], [place], [place] [address] [phone]) [phone]   or   www.lbcnow.org  **Food pantry  [facility] [address], [place], [place] [address] [phone]) [phone]   or   http://www.onestepfurther.com **Food pantry, nutrition education, gardening activities  [facility] [address], [place], [place] [address] [phone]) [phone] **Food pantry  [facility] [place] [address], [place], [place] [address] [phone]) [phone]   or   www.salvationarmyofgreensboro.org **Food pantry  [facility] [place] [address], [place], [place] [address] [phone]) [phone]   or   http://senior-resources-[place].org **Meals on Wheels Program  [place] [address], [place], [place] [address] [phone]) [phone]   or   www.stmattchurch.com  **Food pantry  [place] [facility] [address], , [place] [address] [phone]   or   vandaliapresbyterianchurch.org **Food pantry  [place] [facility]  [place] [facility] [address], [place], [place] [address] [phone]) [phone]   or   www.facebook.com/JulianUMC Food pantry  [facility] [address], [place], [place] [address] [phone] **Food pantry  [place] [facility]   Department of Public Health[place] [address], [place], [place] [address] [phone]) [phone]   or   www.co.[place].us/ph/  [place] DHHS-Public Health-WIC ([place]) [address], [place], [place] [address] [phone]) [phone]   or   https://www.[place]nc.gov/our-county/human-services/health-department **WIC for pregnant and postpartum women, infants and children up to 5 years old  Comp[facility]ionate [facility] [address], [place], [place] [address] [phone]) [phone] **Food pantry  [place] [facility] [address] [place] [address], [place], [place] [address] [phone]   or   emerywoodbaptistchurch.com *Food pantry  [employer] [facility] [address], [place], [place] [address] [phone]   or   www.fcchighpoint.org **Food pantry  [facility] [address], [place],  [address] [phone]) [phone]   or    www.helpinghandshp.org **Food pantry  [facility] Food [facility] [address], [place],  [address] [phone]) [phone]   or   www.facebook.com/KBCI1 **Food Pantry  [employer] [facility] [address] [place] [facility]   [address] [place] [place], [place] [address] [phone]   or   www.abbottscreek.org [employer] [place] [phone]   **Delivers meals  New Beginnings Full [facility] [address] [place], [place] [address] [phone]   or   nbfgm.sundaystreamwebsites.com  Tree surgeon of [employer] [address], [place], [place] [address] [phone]   or   www.odm-hp.org  **Food pantry  [address] [place] [facility] [address], [place], [place] [address] [phone]   or   R2live.tv **Food pantry  [facility] [address], [place], [place] [address] [phone]   or   WrestlingMonthly.pl **Emergency food and pet food  Senior Adults Association-[place] [address], [place], [place] [address] [phone]   or   www.senioradults.org **Congregate and delivered meals to older adults  [place] [employer] [facility] Greater [employer] [address], [place], [place] [address] [phone]   or   https://www.miller-montoya.com/ **Back Pack Program for elementary school students  [facility] [address] [place], [place] [address] [phone]   or   www.wardstreetcommunityresources.org **Food pantry  [facility] [address] [place], [place] [address] [phone]   or   ResumeQuery.com.ee **Emergency food, nutrition classes, food budgeting  Food Resources [place]  Department of Social [place]  [address], [place], [place] [address] [phone]  or   www.co.[place].us/pview.aspx?id=14850&catid=407 **SNAP/Other nutrition benefits  [facility] and [facility] [address], [place], [place] [address] [phone]   or  FutureSponsors.be  **SNAP/Other nutrition benefits  [facility] Department of Public [facility] [address] [place], [place], [place] [address] [phone]  or  http://www.rockinghamcountypublichealth.org **WIC for pregnant and postpartum women, infants and children up to 35 years old  Aging, Disability and Transit [facility] [address] [place], [place] [address] [phone]  or www.BlackjackCoupons.com.br **Prepared meals for older adults  [facility] Food Pantry [address], [place], [place] [address] [phone]  or  http://caldwell-sandoval.com/ **Food pantry  [facility] [address], [place], [place] [address] [phone]   or   https://www.handsofgod.org/  Tour manager  [facility] in [place] Food [facility] [address] [place], [place] [address] [phone] **Food pantry  [place] [facility] for [employer] [address] [place], [place], [place] [address] [phone]   or   www.ci.[place].us/government/parks_and_recreation/senior_center/index.php **Congregate meal for older adults  [facility] [address] [place], [place] [address] [phone]   or www.reidsvilleoutreachcenter.org  **Food pantry  [facility] [address], [place], [place] [address] [phone]   or   TelevisionEnthusiast.fr Animator Resources [place] [employer] (G[employer]) [address] [place], [place], [place] [address] https://www.[place].gov/departments/transportation/gdot-divisions/[place]-transit-agency-public-transportation-division     . Fixed-route bus services, including regional fare cards for PART, [place], [place], and WSTA buses.  . Reduced fare bus ID's available for Medicaid, Medicare, and "orange card"  recipients.  [place] [employer] SCAT offers curb-to-curb and door-to-door bus services for people with disabilities who are unable to use a fixed-bus route; also offers a shared-ride program.   Helpful tips:  -Routes available online and physical maps available at the main bus hub lobby (each for a specific route) -Smartphone directions often include bus routes (see the "bus" icon, next to the "car" and "walk" icons) -Routes differ on weekends, evenings and  holidays, so plan ahead!  -If you have Medicaid, Medicare, or orange card, plan to obtain a reduced-fare ID to save 50% on rides. Check days and times to obtain an ID, and bring all necessary documents.   [employer] [place]) [address] [place], [place]. [address] [place], [place] [address] [phone] SpotApps.nl **Fixed-bus route services, and demand response bus service for older adults  Department of Social [facility] [address], [place], [place] [address] [phone] www.MysterySinger.com.cy **Medicaid transportation is available to [facility] recipients who need assistance getting to [facility] medical appointments and providers  [facility] [address] [place] [address], [place], [place] [address] www.cjmedicaltransportation.com  ** Offers non-emergency transportation for medical appointments  Wheels [address], [place], [place] [address] [phone] www.wheels4hope.org **REFERRAL NEEDED by specific agencies (see website), after meeting specified criteria only  [employer] for [employer]) [address] [place], [place] [address] [phone]  BuyingShow.es  *Regional fixed-bus routes between counties (example: [place] to [place]) and [employer] of [place]  [address], [place], [place] [address] [phone] Http://senior-resources-[place].org  Museum/gallery exhibitions officer available (call or see website for details)  [facility] Ad[facility] [address] [place], [place] [address] [phone] www.senioradults.org  **Ride coordination    Transportation Services [facility]  Aging, Disability and Transit [facility] [address] [place], [place] [address] [phone] www.adtsrc.[facility] Department of Health and [facility] [address], [place], [place] [address] [phone]  FutureSponsors.be  **Transportation expense assistance  Department of Social [place] [address], [place], [place] [address] [phone] www.co.[place].us/pview.aspx?id=14850&catid=407  **Medicaid transportation for recipients who need assistance getting to [facility] medical appointments and providers        If you are in need of transportation to get to and from your appointments in our office.  You can reach Transportation Services by calling [phone] Monday - Friday  7am-6pm.  Meet the Provider Zoom Sessions for [facility]  We are offering Virtual sessions for current, new and prospective patients to learn about our practice, model of care and services and answer questions about birth during Covid. No registration required.   2021 Dates August 24th at 6:00 pm September 16th at 6:00 pm October 21st at 6:00 pm November 18th at 6:00 pm December 16th at 6:00 pm   [email or web address]   At [facility], we work as an integrated team, providing care to address both physical and emotional health. Your medical provider may refer you to see our Behavioral Health Clinician [facility]) on the same day you see your medical provider, as availability permits.   Our [facility] is available to all patients, visits generally last between 20-30 minutes, but can be longer or shorter, depending on patient need. The [facility] offers help with stress management, coping with symptoms of depression and anxiety, major life changes , sleep issues, changing risky behavior, grief and loss, life stress, working on personal life goals, and  behavioral health issues, as these all affect your overall health and wellness.  The [facility] is NOT available for the following: FMLA paperwork, court-ordered evaluations, specialty assessments (custody or disability), letters to employers, or obtaining certification for an emotional support animal. The [facility] does not provide long-term therapy. You have the right to refuse integrated behavioral health services, or to reschedule to see the [facility] at a later date.  Exception: If you are  having thoughts of suicide, we require that you either see the [facility] for further assessment, or contract for safety with your medical provider. Confidentiality exception: If it is suspected that a child or disabled adult is being abused or neglected, we are required by law to report that to either Child Protective Services or Adult Management consultant.  If you have a diagnosis of Bipolar affective disorder, Schizophrenia, or recurrent Major depressive disorder, we will recommend that you establish care with a psychiatrist, as these are lifelong, chronic conditions, and we want your overall emotional health and medications to be more closely monitored. If you anticipate needing extended maternity leave due to mental illness, it it recommended you inform your medical provider, so we can put in a referral to a  psychiatrist as soon as possible. The [facility] is unable to recommend an extended maternity leave for mental health issues. Your medical provider or [facility] may refer you to a therapist for ongoing, traditional therapy, or to a psychiatrist, for medication management, if it would benefit your  overall health. Depending on your insurance, you may have a copay to see the [facility]. If you are uninsured, it is recommended that you apply for financial assistance. (Forms may be requested at the front desk for in-person visits, via MyChart, or request a form during a virtual visit).  If you see the [facility] more than 6 times, you will have to complete a comprehensive clinical assessment interview with the [facility] to resume integrated services.  For virtual visits with the [facility], you must be physically in the state of [place] at the time of the visit. For example, if you live in [place], you will have to do an in-person visit with the [facility]. If you are going out of the state or country for any reason, the [facility] may see you virtually when you return to [place], but not while you are physically outside of [place].

## 2019-12-23 NOTE — Progress Notes (Signed)
New OB Intake  I connected with  Eileen Abbott on 12/23/19 at 2:30 by MyChart and verified that I am speaking with the correct person using two identifiers. Nurse is located at [facility] and pt is located at home.  I discussed the limitations, risks, security and privacy concerns of performing an evaluation and management service by telephone and the availability of in person appointments. I also discussed with the patient that there may be a patient responsible charge related to this service. The patient expressed understanding and agreed to proceed.  I explained I am completing New OB Intake today. We discussed her EDD of 07/14/2020 that is based on early [place] at [redacted]w[redacted]d.  Pt is G1/P0. I reviewed her allergies, medications, Medical/Surgical/OB history, and appropriate screenings. I informed her of [facility] services. Based on history, this is a/an uncomplicated pregnancy.  Concerns addressed today  She does have Food and Transportation issues. Resources placed in AVS. Will need to sign release at new ob visit and referral will need to be sent in.   MyChart/Babyscripts MyChart access verified. I explained pt will have some visits in office and some virtually. Babyscripts instructions given. Email sent to patient.   Blood Pressure Cuff Blood pressure cuff ordered for patient to pick-up from [employer]. Explained after first prenatal appt pt will check weekly and document in Babyscripts.  Anatomy US Explained first scheduled [place] will be around 19 weeks. Anatomy US scheduled for 02/19/20 at 0945. Pt notified to arrive at 0930.  Labs Discussed Avelina Laine genetic screening with patient. She is undecided.  Routine prenatal labs needed.  First visit review I reviewed new OB appt with pt. I explained she will have a pelvic exam, ob bloodwork with genetic screening if desired, and PAP smear. Explained pt will be seen by Gerrit Heck, CNM at first visit; encounter routed to appropriate provider.  I also  informed her of monthly Zoom meeting with provider and placed information in her  Briza Bark,RN 12/23/2019  2:30 PM

## 2019-12-28 NOTE — Progress Notes (Signed)
Patient was assessed and managed by nursing staff during this encounter. I have reviewed the chart and agree with the documentation and plan. I have also made any necessary editorial changes.  Cherre Robins, CNM 12/28/2019 5:33 AM

## 2020-01-06 ENCOUNTER — Ambulatory Visit (INDEPENDENT_AMBULATORY_CARE_PROVIDER_SITE_OTHER): Payer: Medicaid Other

## 2020-01-06 ENCOUNTER — Other Ambulatory Visit: Payer: Self-pay

## 2020-01-06 VITALS — BP 117/71 | HR 90 | Temp 98.3°F | Wt 123.0 lb

## 2020-01-06 DIAGNOSIS — O26899 Other specified pregnancy related conditions, unspecified trimester: Secondary | ICD-10-CM

## 2020-01-06 DIAGNOSIS — Z6791 Unspecified blood type, Rh negative: Secondary | ICD-10-CM

## 2020-01-06 DIAGNOSIS — R519 Headache, unspecified: Secondary | ICD-10-CM

## 2020-01-06 DIAGNOSIS — Z3481 Encounter for supervision of other normal pregnancy, first trimester: Secondary | ICD-10-CM | POA: Diagnosis not present

## 2020-01-06 DIAGNOSIS — Z3490 Encounter for supervision of normal pregnancy, unspecified, unspecified trimester: Secondary | ICD-10-CM

## 2020-01-06 DIAGNOSIS — Z3A12 12 weeks gestation of pregnancy: Secondary | ICD-10-CM | POA: Diagnosis not present

## 2020-01-06 LAB — POCT URINALYSIS DIP (DEVICE)
Bilirubin Urine: NEGATIVE
Glucose, UA: NEGATIVE mg/dL
Hgb urine dipstick: NEGATIVE
Ketones, ur: NEGATIVE mg/dL
Leukocytes,Ua: NEGATIVE
Nitrite: NEGATIVE
Protein, ur: NEGATIVE mg/dL
Specific Gravity, Urine: 1.02 (ref 1.005–1.030)
Urobilinogen, UA: 0.2 mg/dL (ref 0.0–1.0)
pH: 7 (ref 5.0–8.0)

## 2020-01-06 NOTE — Patient Instructions (Addendum)
Second Trimester of Pregnancy The second trimester is from week 14 through week 27 (months 4 through 6). The second trimester is often a time when you feel your best. Your body has adjusted to being pregnant, and you begin to feel better physically. Usually, morning sickness has lessened or quit completely, you may have more energy, and you may have an increase in appetite. The second trimester is also a time when the fetus is growing rapidly. At the end of the sixth month, the fetus is about 9 inches long and weighs about 1 pounds. You will likely begin to feel the baby move (quickening) between 16 and 20 weeks of pregnancy. Body changes during your second trimester Your body continues to go through many changes during your second trimester. The changes vary from woman to woman.  Your weight will continue to increase. You will notice your lower abdomen bulging out.  You may begin to get stretch marks on your hips, abdomen, and breasts.  You may develop headaches that can be relieved by medicines. The medicines should be approved by your health care provider.  You may urinate more often because the fetus is pressing on your bladder.  You may develop or continue to have heartburn as a result of your pregnancy.  You may develop constipation because certain hormones are causing the muscles that push waste through your intestines to slow down.  You may develop hemorrhoids or swollen, bulging veins (varicose veins).  You may have back pain. This is caused by: ? Weight gain. ? Pregnancy hormones that are relaxing the joints in your pelvis. ? A shift in weight and the muscles that support your balance.  Your breasts will continue to grow and they will continue to become tender.  Your gums may bleed and may be sensitive to brushing and flossing.  Dark spots or blotches (chloasma, mask of pregnancy) may develop on your face. This will likely fade after the baby is born.  A dark line from your  belly button to the pubic area (linea nigra) may appear. This will likely fade after the baby is born.  You may have changes in your hair. These can include thickening of your hair, rapid growth, and changes in texture. Some women also have hair loss during or after pregnancy, or hair that feels dry or thin. Your hair will most likely return to normal after your baby is born. What to expect at prenatal visits During a routine prenatal visit:  You will be weighed to make sure you and the fetus are growing normally.  Your blood pressure will be taken.  Your abdomen will be measured to track your baby's growth.  The fetal heartbeat will be listened to.  Any test results from the previous visit will be discussed. Your health care provider may ask you:  How you are feeling.  If you are feeling the baby move.  If you have had any abnormal symptoms, such as leaking fluid, bleeding, severe headaches, or abdominal cramping.  If you are using any tobacco products, including cigarettes, chewing tobacco, and electronic cigarettes.  If you have any questions. Other tests that may be performed during your second trimester include:  Blood tests that check for: ? Low iron levels (anemia). ? High blood sugar that affects pregnant women (gestational diabetes) between 24 and 28 weeks. ? Rh antibodies. This is to check for a protein on red blood cells (Rh factor).  Urine tests to check for infections, diabetes, or protein in the   urine.  An ultrasound to confirm the proper growth and development of the baby.  An amniocentesis to check for possible genetic problems.  Fetal screens for spina bifida and Down syndrome.  HIV (human immunodeficiency virus) testing. Routine prenatal testing includes screening for HIV, unless you choose not to have this test. Follow these instructions at home: Medicines  Follow your health care provider's instructions regarding medicine use. Specific medicines may be  either safe or unsafe to take during pregnancy.  Take a prenatal vitamin that contains at least 600 micrograms (mcg) of folic acid.  If you develop constipation, try taking a stool softener if your health care provider approves. Eating and drinking   Eat a balanced diet that includes fresh fruits and vegetables, whole grains, good sources of protein such as meat, eggs, or tofu, and low-fat dairy. Your health care provider will help you determine the amount of weight gain that is right for you.  Avoid raw meat and uncooked cheese. These carry germs that can cause birth defects in the baby.  If you have low calcium intake from food, talk to your health care provider about whether you should take a daily calcium supplement.  Limit foods that are high in fat and processed sugars, such as fried and sweet foods.  To prevent constipation: ? Drink enough fluid to keep your urine clear or pale yellow. ? Eat foods that are high in fiber, such as fresh fruits and vegetables, whole grains, and beans. Activity  Exercise only as directed by your health care provider. Most women can continue their usual exercise routine during pregnancy. Try to exercise for 30 minutes at least 5 days a week. Stop exercising if you experience uterine contractions.  Avoid heavy lifting, wear low heel shoes, and practice good posture.  A sexual relationship may be continued unless your health care provider directs you otherwise. Relieving pain and discomfort  Wear a good support bra to prevent discomfort from breast tenderness.  Take warm sitz baths to soothe any pain or discomfort caused by hemorrhoids. Use hemorrhoid cream if your health care provider approves.  Rest with your legs elevated if you have leg cramps or low back pain.  If you develop varicose veins, wear support hose. Elevate your feet for 15 minutes, 3-4 times a day. Limit salt in your diet. Prenatal Care  Write down your questions. Take them to  your prenatal visits.  Keep all your prenatal visits as told by your health care provider. This is important. Safety  Wear your seat belt at all times when driving.  Make a list of emergency phone numbers, including numbers for family, friends, the hospital, and police and fire departments. General instructions  Ask your health care provider for a referral to a local prenatal education class. Begin classes no later than the beginning of month 6 of your pregnancy.  Ask for help if you have counseling or nutritional needs during pregnancy. Your health care provider can offer advice or refer you to specialists for help with various needs.  Do not use hot tubs, steam rooms, or saunas.  Do not douche or use tampons or scented sanitary pads.  Do not cross your legs for long periods of time.  Avoid cat litter boxes and soil used by cats. These carry germs that can cause birth defects in the baby and possibly loss of the fetus by miscarriage or stillbirth.  Avoid all smoking, herbs, alcohol, and unprescribed drugs. Chemicals in these products can affect the formation   and growth of the baby.  Do not use any products that contain nicotine or tobacco, such as cigarettes and e-cigarettes. If you need help quitting, ask your health care provider.  Visit your dentist if you have not gone yet during your pregnancy. Use a soft toothbrush to brush your teeth and be gentle when you floss. Contact a health care provider if:  You have dizziness.  You have mild pelvic cramps, pelvic pressure, or nagging pain in the abdominal area.  You have persistent nausea, vomiting, or diarrhea.  You have a bad smelling vaginal discharge.  You have pain when you urinate. Get help right away if:  You have a fever.  You are leaking fluid from your vagina.  You have spotting or bleeding from your vagina.  You have severe abdominal cramping or pain.  You have rapid weight gain or weight loss.  You have  shortness of breath with chest pain.  You notice sudden or extreme swelling of your face, hands, ankles, feet, or legs.  You have not felt your baby move in over an hour.  You have severe headaches that do not go away when you take medicine.  You have vision changes. Summary  The second trimester is from week 14 through week 27 (months 4 through 6). It is also a time when the fetus is growing rapidly.  Your body goes through many changes during pregnancy. The changes vary from woman to woman.  Avoid all smoking, herbs, alcohol, and unprescribed drugs. These chemicals affect the formation and growth your baby.  Do not use any tobacco products, such as cigarettes, chewing tobacco, and e-cigarettes. If you need help quitting, ask your health care provider.  Contact your health care provider if you have any questions. Keep all prenatal visits as told by your health care provider. This is important. This information is not intended to replace advice given to you by your health care provider. Make sure you discuss any questions you have with your health care provider. Document Revised: 06/22/2018 Document Reviewed: 04/05/2016 Elsevier Patient Education  2020 [employer]  Round Ligament Pain  The round ligament is a cord of muscle and tissue that helps support the uterus. It can become a source of pain during pregnancy if it becomes stretched or twisted as the baby grows. The pain usually begins in the second trimester (13-28 weeks) of pregnancy, and it can come and go until the baby is delivered. It is not a serious problem, and it does not cause harm to the baby. Round ligament pain is usually a short, sharp, and pinching pain, but it can also be a dull, lingering, and aching pain. The pain is felt in the lower side of the abdomen or in the groin. It usually starts deep in the groin and moves up to the outside of the hip area. The pain may occur when you:  Suddenly change position, such as  quickly going from a sitting to standing position.  Roll over in bed.  Cough or sneeze.  Do physical activity. Follow these instructions at home:   Watch your condition for any changes.  When the pain starts, relax. Then try any of these methods to help with the pain: ? Sitting down. ? Flexing your knees up to your abdomen. ? Lying on your side with one pillow under your abdomen and another pillow between your legs. ? Sitting in a warm bath for 15-20 minutes or until the pain goes away.  Take over-the-counter and  prescription medicines only as told by your health care provider.  Move slowly when you sit down or stand up.  Avoid long walks if they cause pain.  Stop or reduce your physical activities if they cause pain.  Keep all follow-up visits as told by your health care provider. This is important. Contact a health care provider if:  Your pain does not go away with treatment.  You feel pain in your back that you did not have before.  Your medicine is not helping. Get help right away if:  You have a fever or chills.  You develop uterine contractions.  You have vaginal bleeding.  You have nausea or vomiting.  You have diarrhea.  You have pain when you urinate. Summary  Round ligament pain is felt in the lower abdomen or groin. It is usually a short, sharp, and pinching pain. It can also be a dull, lingering, and aching pain.  This pain usually begins in the second trimester (13-28 weeks). It occurs because the uterus is stretching with the growing baby, and it is not harmful to the baby.  You may notice the pain when you suddenly change position, when you cough or sneeze, or during physical activity.  Relaxing, flexing your knees to your abdomen, lying on one side, or taking a warm bath may help to get rid of the pain.  Get help from your health care provider if the pain does not go away or if you have vaginal bleeding, nausea, vomiting, diarrhea, or painful  urination. This information is not intended to replace advice given to you by your health care provider. Make sure you discuss any questions you have with your health care provider. Document Revised: 08/16/2017 Document Reviewed: 08/16/2017 Elsevier Patient Education  2020 [employer]  Eating Plan for Pregnant Women While you are pregnant, your body requires additional nutrition to help support your growing baby. You also have a higher need for some vitamins and minerals, such as folic acid, calcium, iron, and vitamin D. Eating a healthy, well-balanced diet is very important for your health and your baby's health. Your need for extra calories varies for the three 6956-month segments of your pregnancy (trimesters). For most women, it is recommended to consume: 150 extra calories a day during the first trimester. 300 extra calories a day during the second trimester. 300 extra calories a day during the third trimester. What are tips for following this plan?  Do not try to lose weight or go on a diet during pregnancy. Limit your overall intake of foods that have "empty calories." These are foods that have little nutritional value, such as sweets, desserts, candies, and sugar-sweetened beverages. Eat a variety of foods (especially fruits and vegetables) to get a full range of vitamins and minerals. Take a prenatal vitamin to help meet your additional vitamin and mineral needs during pregnancy, specifically for folic acid, iron, calcium, and vitamin D. Remember to stay active. Ask your health care provider what types of exercise and activities are safe for you. Practice good food safety and cleanliness. Wash your hands before you eat and after you prepare raw meat. Wash all fruits and vegetables well before peeling or eating. Taking these actions can help to prevent food-borne illnesses that can be very dangerous to your baby, such as listeriosis. Ask your health care provider for more information about  listeriosis. What does 150 extra calories look like? Healthy options that provide 150 extra calories each day could be any of the following: 6-8  oz (170-230 g) of plain low-fat yogurt with  cup of berries. 1 apple with 2 teaspoons (11 g) of peanut butter. Cut-up vegetables with  cup (60 g) of hummus. 8 oz (230 mL) or 1 cup of low-fat chocolate milk. 1 stick of string cheese with 1 medium orange. 1 peanut butter and jelly sandwich that is made with one slice of whole-wheat bread and 1 tsp (5 g) of peanut butter. For 300 extra calories, you could eat two of those healthy options each day. What is a healthy amount of weight to gain? The right amount of weight gain for you is based on your BMI before you became pregnant. If your BMI: Was less than 18 (underweight), you should gain 28-40 lb (13-18 kg). Was 18-24.9 (normal), you should gain 25-35 lb (11-16 kg). Was 25-29.9 (overweight), you should gain 15-25 lb (7-11 kg). Was 30 or greater (obese), you should gain 11-20 lb (5-9 kg). What if I am having twins or multiples? Generally, if you are carrying twins or multiples: You may need to eat 300-600 extra calories a day. The recommended range for total weight gain is 25-54 lb (11-25 kg), depending on your BMI before pregnancy. Talk with your health care provider to find out about nutritional needs, weight gain, and exercise that is right for you. What foods can I eat?  Fruits All fruits. Eat a variety of colors and types of fruit. Remember to wash your fruits well before peeling or eating. Vegetables All vegetables. Eat a variety of colors and types of vegetables. Remember to wash your vegetables well before peeling or eating. Grains All grains. Choose whole grains, such as whole-wheat bread, oatmeal, or brown rice. Meats and other protein foods Lean meats, including chicken, [place], fish, and lean cuts of beef, veal, or pork. If you eat fish or seafood, choose options that are higher in  omega-3 fatty acids and lower in mercury, such as salmon, herring, mussels, trout, sardines, pollock, shrimp, crab, and lobster. Tofu. Tempeh. Beans. Eggs. Peanut butter and other nut butters. Make sure that all meats, poultry, and eggs are cooked to food-safe temperatures or "well-done." Two or more servings of fish are recommended each week in order to get the most benefits from omega-3 fatty acids that are found in seafood. Choose fish that are lower in mercury. You can find more information online: PumpkinSearch.com.ee Dairy Pasteurized milk and milk alternatives (such as almond milk). Pasteurized yogurt and pasteurized cheese. Cottage cheese. Sour cream. Beverages Water. Juices that contain 100% fruit juice or vegetable juice. Caffeine-free teas and decaffeinated coffee. Drinks that contain caffeine are okay to drink, but it is better to avoid caffeine. Keep your total caffeine intake to less than 200 mg each day (which is 12 oz or 355 mL of coffee, tea, or soda) or the limit as told by your health care provider. Fats and oils Fats and oils are okay to include in moderation. Sweets and desserts Sweets and desserts are okay to include in moderation. Seasoning and other foods All pasteurized condiments. The items listed above may not be a complete list of foods and beverages you can eat. Contact a dietitian for more information. What foods are not recommended? Fruits Unpasteurized fruit juices. Vegetables Raw (unpasteurized) vegetable juices. Meats and other protein foods Lunch meats, bologna, hot dogs, or other deli meats. (If you must eat those meats, reheat them until they are steaming hot.) Refrigerated pat, meat spreads from a meat counter, smoked seafood that is found in the  refrigerated section of a store. Raw or undercooked meats, poultry, and eggs. Raw fish, such as sushi or sashimi. Fish that have high mercury content, such as tilefish, shark, swordfish, and king mackerel. To learn more  about mercury in fish, talk with your health care provider or look for online resources, such as: PumpkinSearch.com.ee Dairy Raw (unpasteurized) milk and any foods that have raw milk in them. Soft cheeses, such as feta, queso blanco, queso fresco, Brie, Camembert cheeses, blue-veined cheeses, and Panela cheese (unless it is made with pasteurized milk, which must be stated on the label). Beverages Alcohol. Sugar-sweetened beverages, such as sodas, teas, or energy drinks. Seasoning and other foods Homemade fermented foods and drinks, such as pickles, sauerkraut, or kombucha drinks. (Store-bought pasteurized versions of these are okay.) Salads that are made in a store or deli, such as ham salad, chicken salad, egg salad, tuna salad, and seafood salad. The items listed above may not be a complete list of foods and beverages you should avoid. Contact a dietitian for more information. Where to find more information To calculate the number of calories you need based on your height, weight, and activity level, you can use an online calculator such as: PackageNews.is To calculate how much weight you should gain during pregnancy, you can use an online pregnancy weight gain calculator such as: http://jones-berg.com/ Summary While you are pregnant, your body requires additional nutrition to help support your growing baby. Eat a variety of foods, especially fruits and vegetables to get a full range of vitamins and minerals. Practice good food safety and cleanliness. Wash your hands before you eat and after you prepare raw meat. Wash all fruits and vegetables well before peeling or eating. Taking these actions can help to prevent food-borne illnesses, such as listeriosis, that can be very dangerous to your baby. Do not eat raw meat or fish. Do not eat fish that have high mercury content, such as tilefish, shark, swordfish, and king mackerel. Do not eat unpasteurized  (raw) dairy. Take a prenatal vitamin to help meet your additional vitamin and mineral needs during pregnancy, specifically for folic acid, iron, calcium, and vitamin D. This information is not intended to replace advice given to you by your health care provider. Make sure you discuss any questions you have with your health care provider. Document Revised: 07/19/2018 Document Reviewed: 11/25/2016 Elsevier Patient Education  2020 [employer].

## 2020-01-06 NOTE — Progress Notes (Signed)
Subjective:   Eileen Abbott is a 29 y.o. G1P0 at 21w6dby 7wk 1 day early ultrasound being seen today for her first obstetrical visit.  Patient reports this was a planned pregnancy.   Gynecological/Obstetrical History: Her obstetrical history is unremarkable. Patient does intend to breast feed. Pregnancy history fully reviewed.  Patient reports nausea that is improving and round ligament pain.  Sexual Activity and Vaginal Concerns: None  Medical History/ROS:  Patient reports history of fibromyalgia, ADD, and Asthma.  She reports occasional usage of albuterol nebulizer. Patient reports onset of HA that improves with tylenol dosing.   Social History: Patient reports usage of MJ and tobacco which subsided after pregnancy discovery.  Patient reports the FOB is JLavonda Jumbowho is involved and supportive.  Patient reports that she lives with JLarkin Inaand roommates who have children and endorses safety at home.  Patient denies DV/A. Patient not currently employed, but looking for remote position.  HISTORY: OB History  Gravida Para Term Preterm AB Living  1 0 0 0 0 0  SAB TAB Ectopic Multiple Live Births  0 0 0 0 0    # Outcome Date GA Lbr Len/2nd Weight Sex Delivery Anes PTL Lv  1 Current             Last pap smear was done ~ 2 years ago per patient and was normal  Past Medical History:  Diagnosis Date  . Asthma   . Attention deficit disorder   . Fibromyalgia    Past Surgical History:  Procedure Laterality Date  . NO PAST SURGERIES     Family History  Problem Relation Age of Onset  . Diabetes Mother   . Arthritis Mother   . Fibromyalgia Mother   . Irritable bowel syndrome Father   . Vitiligo Father    Social History   Tobacco Use  . Smoking status: Former Smoker    Packs/day: 0.50    Years: 7.00    Pack years: 3.50    Types: Cigarettes  . Smokeless tobacco: Never Used  . Tobacco comment: stopped with pregnancy  Vaping Use  . Vaping Use: Never used    Substance Use Topics  . Alcohol use: Not Currently    Comment: socially  . Drug use: Not Currently    Types: Marijuana    Comment: daily for fibromyalgia , stopped with pregnancy   Allergies  Allergen Reactions  . Banana Nausea And Vomiting  . Eggs Or Egg-Derived Products Nausea And Vomiting   Current Outpatient Medications on File Prior to Visit  Medication Sig Dispense Refill  . folic acid (FOLVITE) 1 MG tablet Take 1 mg by mouth daily.    . Prenatal Vit-Fe Fumarate-FA (PRENATAL MULTIVITAMIN) TABS tablet Take 1 tablet by mouth daily at 12 noon.    . promethazine (PHENERGAN) 25 MG tablet Take 1 tablet (25 mg total) by mouth every 6 (six) hours as needed for nausea or vomiting. 30 tablet 2  . pyridOXINE (VITAMIN B-6) 25 MG tablet Take 1 tablet (25 mg total) by mouth every 8 (eight) hours. 30 tablet 0  . albuterol (PROVENTIL) (2.5 MG/3ML) 0.083% nebulizer solution Take 3 mLs (2.5 mg total) by nebulization every 6 (six) hours as needed for wheezing or shortness of breath. 75 mL 0  . Blood Pressure Monitoring (BLOOD PRESSURE KIT) DEVI 1 Device by Does not apply route as needed. 1 each 0  . doxylamine, Sleep, (UNISOM) 25 MG tablet Take 1 tablet (25 mg total)  by mouth every 8 (eight) hours as needed. (Patient not taking: Reported on 12/23/2019) 30 tablet 0  . DULOXETINE HCL PO Take by mouth. (Patient not taking: Reported on 01/06/2020)     No current facility-administered medications on file prior to visit.    Review of Systems Pertinent items noted in HPI and remainder of comprehensive ROS otherwise negative.  Exam   Vitals:   01/06/20 1413  BP: 117/71  Pulse: 90  Temp: 98.3 F (36.8 C)  Weight: 123 lb (55.8 kg)   Fetal Heart Rate (bpm): 155  OBGyn Exam  Assessment:   29 y.o. year old G1P0 Patient Active Problem List   Diagnosis Date Noted  . Supervision of low-risk pregnancy 12/23/2019  . Fibromyalgia   . Rh negative status during pregnancy 11/08/2019  . Pregnancy of  unknown anatomic location 11/08/2019     Plan:  1. Encounter for supervision of low-risk pregnancy, antepartum -Congratulations given and patient welcomed to practice. -Discussed usage of Babyscripts and virtual visits as additional source of managing and completing PN visits in midst of coronavirus.   *Instructed to take blood pressure and record weekly into babyscripts. *Reviewed modified prenatal visit schedule and platforms used for virtual visits.  -Anticipatory guidance for prenatal visits including labs, ultrasounds, and testing; Initial labs drawn. -Genetic Screening discussed, Quad screen and NIPS: ordered. -Encouraged to complete MyChart Registration for her ability to review results, send requests, and have questions addressed.  -Discussed estimated due date of Jul 14, 2020. -Ultrasound discussed; fetal anatomic survey: ordered and scheduled. -Continue prenatal vitamins  -Influenza offered and declined. -Covid Vaccines Received. -Encouraged to seek out care at office or emergency room for urgent and/or emergent concerns. -Educated on the nature of [facility] with multiple MDs and other Advanced Practice Providers was explained to patient; also emphasized that residents, students are part of our team. Informed of her right to refuse care as she deems appropriate.  -Questions and concerns addressed  2. [redacted] weeks gestation of pregnancy -Nausea improving. -Reviewed travel during pregnancy. -Reviewed round ligament pain. -Reviewed increasing hydration and protein in diet to avoid near syncope. -Patient fully dressed; Will complete pap at 36 weeks or postpartum.  Patient informed.    3. Rh negative, antepartum -Discussed Rh status and what it means. -Informed of need for Rhogam at 30 weeks and again in [facility] if necessary.    4. New onset of headaches -Encouraged to monitor symptoms and inform staff if frequency increases or if unresponsive to  tylenol. -Reiterated balanced nutritional intake and hydration.     Problem list reviewed and updated. Routine obstetric precautions reviewed.  No orders of the defined types were placed in this encounter.   No follow-ups on file.     Maryann Conners, CNM 01/06/2020 2:27 PM

## 2020-01-07 LAB — CBC/D/PLT+RPR+RH+ABO+RUB AB...
Antibody Screen: NEGATIVE
Basophils Absolute: 0 10*3/uL (ref 0.0–0.2)
Basos: 1 %
EOS (ABSOLUTE): 0.2 10*3/uL (ref 0.0–0.4)
Eos: 2 %
Hematocrit: 40 % (ref 34.0–46.6)
Hemoglobin: 13.5 g/dL (ref 11.1–15.9)
Hepatitis B Surface Ag: NEGATIVE
Immature Grans (Abs): 0 10*3/uL (ref 0.0–0.1)
Immature Granulocytes: 0 %
Lymphocytes Absolute: 1.6 10*3/uL (ref 0.7–3.1)
Lymphs: 21 %
MCH: 30.5 pg (ref 26.6–33.0)
MCHC: 33.8 g/dL (ref 31.5–35.7)
MCV: 91 fL (ref 79–97)
Monocytes Absolute: 0.5 10*3/uL (ref 0.1–0.9)
Monocytes: 6 %
Neutrophils Absolute: 5.3 10*3/uL (ref 1.4–7.0)
Neutrophils: 70 %
Platelets: 244 10*3/uL (ref 150–450)
RBC: 4.42 x10E6/uL (ref 3.77–5.28)
RDW: 12.7 % (ref 11.7–15.4)
RPR Ser Ql: NONREACTIVE
Rh Factor: NEGATIVE
Rubella Antibodies, IGG: 6.77 index (ref >0.99)
Varicella zoster IgG: 384 index (ref >165)
WBC: 7.6 10*3/uL (ref 3.4–10.8)

## 2020-01-08 LAB — URINE CULTURE

## 2020-01-15 ENCOUNTER — Encounter: Payer: Self-pay | Admitting: *Deleted

## 2020-01-20 ENCOUNTER — Encounter: Payer: Self-pay | Admitting: *Deleted

## 2020-01-22 ENCOUNTER — Encounter: Payer: Self-pay | Admitting: Nurse Practitioner

## 2020-01-22 ENCOUNTER — Telehealth: Payer: Self-pay | Admitting: General Practice

## 2020-01-22 DIAGNOSIS — Z148 Genetic carrier of other disease: Secondary | ICD-10-CM | POA: Insufficient documentation

## 2020-01-22 NOTE — Telephone Encounter (Signed)
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   Called patient, no answer- left message stating we are trying to reach you with results, please check your mychart account for additional information.

## 2020-02-18 ENCOUNTER — Telehealth (INDEPENDENT_AMBULATORY_CARE_PROVIDER_SITE_OTHER): Payer: Medicaid Other | Admitting: Nurse Practitioner

## 2020-02-18 ENCOUNTER — Encounter: Payer: Self-pay | Admitting: Nurse Practitioner

## 2020-02-18 VITALS — BP 102/68 | HR 92

## 2020-02-18 DIAGNOSIS — O99891 Other specified diseases and conditions complicating pregnancy: Secondary | ICD-10-CM

## 2020-02-18 DIAGNOSIS — Z148 Genetic carrier of other disease: Secondary | ICD-10-CM

## 2020-02-18 DIAGNOSIS — Z3A19 19 weeks gestation of pregnancy: Secondary | ICD-10-CM

## 2020-02-18 DIAGNOSIS — M797 Fibromyalgia: Secondary | ICD-10-CM

## 2020-02-18 DIAGNOSIS — Z3492 Encounter for supervision of normal pregnancy, unspecified, second trimester: Secondary | ICD-10-CM

## 2020-02-18 NOTE — Progress Notes (Signed)
   OBSTETRICS PRENATAL VIRTUAL VISIT ENCOUNTER NOTE  Provider location: Center for [facility]   I connected with Eileen Abbott on 02/18/20 at  1:35 PM EST by MyChart Video Encounter at home and verified that I am speaking with the correct person using two identifiers.   I discussed the limitations, risks, security and privacy concerns of performing an evaluation and management service virtually and the availability of in person appointments. I also discussed with the patient that there may be a patient responsible charge related to this service. The patient expressed understanding and agreed to proceed. Subjective:  Eileen Abbott is a 29 y.o. G1P0 at [redacted]w[redacted]d being seen today for ongoing prenatal care.  She is currently monitored for the following issues for this low-risk pregnancy and has Rh negative status during pregnancy; Supervision of low-risk pregnancy; Fibromyalgia; and Genetic carrier status on their problem list.  Patient reports no complaints.  Contractions: Not present. Vag. Bleeding: None.  Movement: Present. Denies any leaking of fluid.   The following portions of the patient's history were reviewed and updated as appropriate: allergies, current medications, past family history, past medical history, past social history, past surgical history and problem list.   Objective:   Vitals:   02/18/20 1337  BP: 102/68  Pulse: 92    Fetal Status:     Movement: Present     General:  Alert, oriented and cooperative. Patient is in no acute distress.  Respiratory: Normal respiratory effort, no problems with respiration noted  Mental Status: Normal mood and affect. Normal behavior. Normal judgment and thought content.  Rest of physical exam deferred due to type of encounter  Imaging: No results found.  Assessment and Plan:  Pregnancy: G1P0 at [redacted]w[redacted]d 1. Encounter for supervision of low-risk pregnancy in second trimester Will come for lab only  visit for AFP and HIV, Hep C - missed on initial OB labs Has signed up for childbirth classes and tour - will also sign up for breastfeeding Checking BP and entering into Babyscripts - reviewed readings - all normal  - AFP, Serum, Open Spina Bifida; Future - HIV Antibody (routine testing w rflx); Future - Hepatitis C Antibody; Future  2. Fibromyalgia   3. Genetic carrier status Discussed risk for SMA - will schedule for genetic counseling at [facility]  - [facility] GENETICS REFERRAL   Preterm labor symptoms and general obstetric precautions including but not limited to vaginal bleeding, contractions, leaking of fluid and fetal movement were reviewed in detail with the patient. I discussed the assessment and treatment plan with the patient. The patient was provided an opportunity to ask questions and all were answered. The patient agreed with the plan and demonstrated an understanding of the instructions. The patient was advised to call back or seek an in-person office evaluation/go to [facility] at [facility] for any urgent or concerning symptoms. Please refer to After Visit Summary for other counseling recommendations.   I provided 10 minutes of face-to-face time during this encounter.  Return in about 4 weeks (around 03/17/2020) for needs lab visit only in next 1-2 weeks and ROB in 4 weeks - can be virtual.  Future Appointments  Date Time Provider Department Center  02/19/2020  9:45 AM [facility] US[facility]    Currie Paris, NP Center for [employer], [facility]

## 2020-02-18 NOTE — Progress Notes (Signed)
1:29p- Called Pt for My Chart visit, no answer, no VM to leave a message, will call back in 10 minutes.

## 2020-02-19 ENCOUNTER — Other Ambulatory Visit: Payer: Self-pay | Admitting: *Deleted

## 2020-02-19 ENCOUNTER — Ambulatory Visit: Payer: No Typology Code available for payment source

## 2020-02-19 ENCOUNTER — Other Ambulatory Visit: Payer: Self-pay

## 2020-02-19 DIAGNOSIS — Z3490 Encounter for supervision of normal pregnancy, unspecified, unspecified trimester: Secondary | ICD-10-CM | POA: Diagnosis not present

## 2020-02-19 DIAGNOSIS — O43192 Other malformation of placenta, second trimester: Secondary | ICD-10-CM

## 2020-02-19 DIAGNOSIS — M797 Fibromyalgia: Secondary | ICD-10-CM

## 2020-02-19 DIAGNOSIS — O43199 Other malformation of placenta, unspecified trimester: Secondary | ICD-10-CM | POA: Insufficient documentation

## 2020-02-26 ENCOUNTER — Other Ambulatory Visit: Payer: No Typology Code available for payment source

## 2020-02-26 ENCOUNTER — Other Ambulatory Visit: Payer: Self-pay

## 2020-02-26 DIAGNOSIS — Z3492 Encounter for supervision of normal pregnancy, unspecified, second trimester: Secondary | ICD-10-CM

## 2020-03-05 LAB — AFP, SERUM, OPEN SPINA BIFIDA
AFP MoM: 1.87
AFP Value: 124.1 ng/mL
Gest. Age on Collection Date: 20.1 weeks
Maternal Age At EDD: 30.7 yr
OSBR Risk 1 IN: 1083
Test Results:: NEGATIVE
Weight: 123 [lb_av]

## 2020-03-05 LAB — HEPATITIS C ANTIBODY: Hep C Virus Ab: <0.1 s/co ratio (ref 0.0–0.9)

## 2020-03-05 LAB — HIV ANTIBODY (ROUTINE TESTING W REFLEX): HIV Screen 4th Generation wRfx: NONREACTIVE

## 2020-03-14 NOTE — L&D Delivery Note (Signed)
Delivery Note Called to bedside and patient complete and pushing. At 3:46 AM a viable female was delivered via Vaginal, Spontaneous (Presentation: Left Occiput Anterior).  APGAR: 9, 9; weight pending. Tight nuchal cord noted at delivery, delivered through.   Placenta status: Spontaneous, Intact.  Cord: 3 vessels with the following complications: None. Placenta to pathology in the setting of marginal cord insert. Post placental liletta placed, refer to procedure note for further details.  Anesthesia: None Episiotomy: None Lacerations: Left Labial, hemostatic Est. Blood Loss (mL): 50  Mom to postpartum.  Baby to Couplet care / Skin to Skin.  Eileen Abbott 07/15/2020, 4:15 AM

## 2020-03-18 ENCOUNTER — Other Ambulatory Visit: Payer: Self-pay

## 2020-03-18 ENCOUNTER — Encounter: Payer: Self-pay | Admitting: Obstetrics and Gynecology

## 2020-03-18 ENCOUNTER — Ambulatory Visit (INDEPENDENT_AMBULATORY_CARE_PROVIDER_SITE_OTHER): Payer: No Typology Code available for payment source | Admitting: Obstetrics and Gynecology

## 2020-03-18 VITALS — BP 114/66 | HR 80 | Wt 134.2 lb

## 2020-03-18 DIAGNOSIS — O43199 Other malformation of placenta, unspecified trimester: Secondary | ICD-10-CM

## 2020-03-18 DIAGNOSIS — Z3492 Encounter for supervision of normal pregnancy, unspecified, second trimester: Secondary | ICD-10-CM

## 2020-03-18 DIAGNOSIS — O26892 Other specified pregnancy related conditions, second trimester: Secondary | ICD-10-CM

## 2020-03-18 DIAGNOSIS — Z6791 Unspecified blood type, Rh negative: Secondary | ICD-10-CM

## 2020-03-18 NOTE — Progress Notes (Signed)
Subjective:  Eileen Abbott is a 30 y.o. G1P0 at [redacted]w[redacted]d being seen today for ongoing prenatal care.  She is currently monitored for the following issues for this low-risk pregnancy and has Rh negative status during pregnancy; Supervision of low-risk pregnancy; Fibromyalgia; Genetic carrier status; and Marginal insertion of umbilical cord affecting management of mother on their problem list.  Patient reports no complaints.  Contractions: Not present. Vag. Bleeding: None.  Movement: Present. Denies leaking of fluid.   The following portions of the patient's history were reviewed and updated as appropriate: allergies, current medications, past family history, past medical history, past social history, past surgical history and problem list. Problem list updated.  Objective:   Vitals:   03/18/20 1335  BP: 114/66  Pulse: 80  Weight: 60.9 kg    Fetal Status: Fetal Heart Rate (bpm): 151   Movement: Present     General:  Alert, oriented and cooperative. Patient is in no acute distress.  Skin: Skin is warm and dry. No rash noted.   Cardiovascular: Normal heart rate noted  Respiratory: Normal respiratory effort, no problems with respiration noted  Abdomen: Soft, gravid, appropriate for gestational age. Pain/Pressure: Present     Pelvic:  Cervical exam deferred        Extremities: Normal range of motion.  Edema: None  Mental Status: Normal mood and affect. Normal behavior. Normal judgment and thought content.   Urinalysis:      Assessment and Plan:  Pregnancy: G1P0 at [redacted]w[redacted]d  1. Encounter for supervision of low-risk pregnancy in second trimester Stable Declines Flu vaccine Glucola next visit  2. Marginal insertion of umbilical cord affecting management of mother Growth scan next week  3. Rh negative status during pregnancy in second trimester Rhogam next visit  Preterm labor symptoms and general obstetric precautions including but not limited to vaginal bleeding, contractions,  leaking of fluid and fetal movement were reviewed in detail with the patient. Please refer to After Visit Summary for other counseling recommendations.  Return in about 4 weeks (around 04/15/2020) for face to face, any provider, OB visit, glucola appt.   Hermina Staggers, MD

## 2020-03-18 NOTE — Patient Instructions (Signed)

## 2020-03-20 NOTE — BH Specialist Note (Addendum)
Integrated Behavioral Health via Telemedicine Visit  03/20/2020 Eileen Abbott [id number]  Number of Integrated Behavioral Health visits: 1 Session Start time: 2:15  Session End time: 2:48 Total time: 37  Referring Provider: Nettie Elm, MD Patient/Family location: Home [facility] Provider location: Center for [facility]  All persons participating in visit: Patient Eileen Abbott and [facility] Kutzer   Types of Service: Individual psychotherapy  I connected with [name] and/or Saretta B Friel's n/a by Telephone  (Video is Surveyor, mining) and verified that I am speaking with the correct person using two identifiers.Discussed confidentiality: Yes   I discussed the limitations of telemedicine and the availability of in person appointments.  Discussed there is a possibility of technology failure and discussed alternative modes of communication if that failure occurs.  I discussed that engaging in this telemedicine visit, they consent to the provision of behavioral healthcare and the services will be billed under their insurance.  Patient and/or legal guardian expressed understanding and consented to Telemedicine visit: Yes   Presenting Concerns: Patient and/or family reports the following symptoms/concerns: Pt states her primary goal today is to prevent depression postpartum; is sleeping and eating well, taking prenatal vitamin, and has good family support; primary concern is life stress and worry over finances, household and school responsibilities, caring for FOB's 31yo and 2.5yo children while in online graduate school.  Duration of problem: Current pregnancy; Severity of problem: moderate  Patient and/or Family's Strengths/Protective Factors: Social connections, Social and Emotional competence, Concrete supports in place (healthy food, safe environments, etc.) and Sense of purpose  Goals Addressed: Patient will: 1.   Reduce symptoms of: anxiety, depression and stress  2.  Increase knowledge and/or ability of: healthy habits  3.  Demonstrate ability to: Increase healthy adjustment to current life circumstances  Progress towards Goals: Ongoing  Interventions: Interventions utilized:  Solution-Focused Strategies, Psychoeducation and/or Health Education and Link to [employer] Assessments completed: Not Needed  Patient and/or Family Response: Pt agrees with treatment plan  Assessment: Patient currently experiencing Psychosocial stress.   Patient may benefit from psychoeducation and brief therapeutic interventions regarding coping with symptoms of current life stressors .  Plan: 1. Follow up with behavioral health clinician on : Two weeks 2. Behavioral recommendations:  -Continue taking prenatal vitamin daily -Continue prioritizing  self-care via healthy sleeping and eating daily -Continue accepting offers of help/support from family and friends during pregnancy and postpartum -Read through Postpartum Planner (on After Visit Summary) -Consider additional community support available (on AVS) 3. Referral(s): Integrated Art gallery manager (In Clinic) and [employer]:  Housing and Transportation  I discussed the assessment and treatment plan with the patient and/or parent/guardian. They were provided an opportunity to ask questions and all were answered. They agreed with the plan and demonstrated an understanding of the instructions.   They were advised to call back or seek an in-person evaluation if the symptoms worsen or if the condition fails to improve as anticipated.  Rae Lips, LCSW   Depression screen [facility] 2/9 03/25/2020 01/06/2020 12/23/2019  Decreased Interest 1 0 0  Down, Depressed, Hopeless 1 0 0  PHQ - 2 Score 2 0 0  Altered sleeping 1 1 1   Tired, decreased energy 1 1 2   Change in appetite 1 0 3  Feeling bad or failure about yourself  1 0 0   Trouble concentrating 0 0 0  Moving slowly or fidgety/restless 0 0 0  Suicidal thoughts 0  0 0  PHQ-9 Score 6 2 6    GAD 7 : Generalized Anxiety Score 03/25/2020 01/06/2020 12/23/2019  Nervous, Anxious, on Edge 1 0 1  Control/stop worrying 1 0 0  Worry too much - different things 1 0 0  Trouble relaxing 1 0 2  Restless 0 0 0  Easily annoyed or irritable 2 0 2  Afraid - awful might happen 2 0 0  Total GAD 7 Score 8 0 5

## 2020-03-21 ENCOUNTER — Other Ambulatory Visit: Payer: Self-pay | Admitting: Obstetrics and Gynecology

## 2020-03-21 DIAGNOSIS — Z3A23 23 weeks gestation of pregnancy: Secondary | ICD-10-CM

## 2020-03-25 ENCOUNTER — Ambulatory Visit: Payer: No Typology Code available for payment source

## 2020-03-31 ENCOUNTER — Ambulatory Visit: Payer: No Typology Code available for payment source

## 2020-04-01 ENCOUNTER — Other Ambulatory Visit: Payer: Self-pay

## 2020-04-01 ENCOUNTER — Ambulatory Visit (INDEPENDENT_AMBULATORY_CARE_PROVIDER_SITE_OTHER): Payer: Medicaid Other | Admitting: Clinical

## 2020-04-01 DIAGNOSIS — Z658 Other specified problems related to psychosocial circumstances: Secondary | ICD-10-CM

## 2020-04-01 NOTE — Patient Instructions (Signed)
Center for [facility] at [facility] [place], [place] [address] [phone] (main office) [phone] [facility] office)  Transportation Resources [place]) [place], [place], [place] [address] https://www.[place].gov/departments/transportation/gdot-divisions/[place]-transit-agency-public-transportation-division     . Fixed-route bus services, including regional fare cards for PART, [place], [place], and WSTA buses.  . Reduced fare bus ID's available for Medicaid, Medicare, and "orange card" recipients.  Marland Kitchen SCAT offers curb-to-curb and door-to-door bus services for people with disabilities who are unable to use a fixed-bus route; also offers a shared-ride program.   Helpful tips:  -Routes available online and physical maps available at the main bus hub lobby (each for a specific route) -Smartphone directions often include bus routes (see the "bus" icon, next to the "car" and "walk" icons) -Routes differ on weekends, evenings and holidays, so plan ahead!  -If you have Medicaid, Medicare, or orange card, plan to obtain a reduced-fare ID to save 50% on rides. Check days and times to obtain an ID, and bring all necessary documents.   [employer] [place]) [place], [place]. [address] [place], [place] [address] [phone] http://www.[email or web address] **Fixed-bus route services, and demand response bus service for older adults  [facility] [address] [place], [place] [address] [phone] www.[email or web address].fi **Medicaid transportation is available to [facility] recipients who need assistance getting to [facility] medical appointments and providers  [facility] [address], [place], [place]  [address] www.cjmedicaltransportation.com  ** Offers non-emergency transportation for medical appointments  [employer] [place], [place], [place] [address] [phone] www.[email or web address] **REFERRAL NEEDED by specific agencies (see website), after meeting specified criteria only  [employer] for [employer]) [address] [place], [place] [address] [phone]  BikerFestival.is  *Regional fixed-bus routes between counties (example: [place] to [place]) and [employer] of [place], [place], [place] [address] [phone] Http://senior-resources-[place]ilford.org  Production assistant, radio available (call or see website for details)  [place] [address], [place], [place] [address] [phone] www.senioradults.org  **Ride coordination  If you are in need of transportation to get to and from your appointments in our office.  You can reach Transportation Services by calling [phone] Monday - Friday  7am-6pm.  Housing Resources                    [facility] (serves [place], [place], [place], [place], [place], [place], [place], [place], [place], [place], [place], [place], and [place] counties) [address] [place], [place] [address] [phone] http://[email or web address]-[email or web address]  **Rental assistance, Home Rehabilitation,Weatherization Assistance Program, Forensic psychologist, Housing Voucher Program   Housing Resources [place] [address] [place], [place] [address] [phone] www.gha-[place].[facility] [address] Lin [place], [place] [address] [phone] https://[email or web address] **Programs include: Engineer, building services and Housing Counseling, Healthy Doctor, general practice, Homeless Prevention and [place] [address], [place], [place], [place] [address] [phone] www.https://www.farmer-stevens.info/ **housing applications/recertification; tax payment relief/exemption under specific qualifications  [facility] [address] [place], [place] [address] www.onlinegreensboro.com/~maryshouse **transitional housing for women in recovery who have minor children or are pregnant  [place], [place],  [address] ArtistMovie.se  **emergency shelter and support services for families facing homelessness  [place] [address] [place],  [address] [phone])  [phone] www.youthfocus.org **transitional housing to pregnant women; emergency housing for youth who have run away, are experiencing a family crisis, are victims of abuse or neglect, or are homeless  [facility] [address], [place], [place] [address] [phone] ircgso.org **Drop-in center for people experiencing homelessness; overnight warming center when temperature is 25 degrees or below  [employer] [address] [place], [place], [place] [address] [phone] https://reentrystaffingagency.[email or web address] **help with affordable housing to people experiencing homelessness or unemployment due to incarceration  [facility] [address] [place], [place] [address] [phone] www.greensborourbanministry.org  **emergency and transitional housing, rent/mortgage assistance, utility assistance  [facility] [address] [place], [place] [address] [phone] www.salvationarmyofgreensboro.org **emergency and transitional housing  [employer] for [employer] [address], [place], [place] [address] [phone] Www.habitatgreensboro.[facility]   [employer] [address], [place], [place] [address] [phone] https://chshousing.org **Home Ownership/Affordable Housing  Program and [facility]  [employer] [facility] [address], [place], [place] [address] [phone] PaidValue.com.cy **home buyer education courses, foreclosure prevention  [facility] [address], [place], [place] [address] [phone] DefMagazine.is **Environmental Exposure Assessment (investigation of homes where either children or pregnant women with a confirmed elevated blood lead level reside)  [facility] [address] [place], [place] [address] [phone] ShowReturn.ca **Home Expense Assistance/Repairs Program; offers home accessibility updates, such as ramps or bars in the bathroom  [facility] [address], [place], [place] [address] [phone] https://www.self-help.org/locations/[place]-branch **Offers credit-building and banking services to people unable to use traditional banking  .   BRAINSTORMING  Develop a Plan Goals: . Provide a way to start conversation about your new life with a baby . Assist parents in recognizing and using resources within their reach . Help pave the way before birth for an easier period of transition afterwards.  Make a list of the following information to keep in a central location: . Full name of Mom and Partner: _____________________________________________ . Baby's full name and Date of Birth: ___________________________________________ . Home Address: ___________________________________________________________ ________________________________________________________________________ . Home Phone: ____________________________________________________________ . Parents' cell numbers: _____________________________________________________ ________________________________________________________________________ . Name and  contact info for OB: ______________________________________________ . Name and contact info for Pediatrician:________________________________________ . Contact info for Lactation Consultants: ________________________________________  REST and SLEEP *You each need at least 4-5 hours of uninterrupted sleep every day. Write specific names and contact information.* . How are you going to rest in the postpartum period? While partner's home? When partner returns to work? When you both return to work? Marland Kitchen Where will your baby sleep? Marland Kitchen Who is available to help during the day? Evening? Night? . Who could move in for a period to help support you? Marland Kitchen What are some ideas to help you get enough sleep? __________________________________________________________________________________________________________________________________________________________________________________________________________________________________________ NUTRITIOUS FOOD AND DRINK *Plan for meals before your baby is born so you can have healthy food to eat during the immediate postpartum period.* . Who will look after breakfast? Lunch? Dinner? List names and contact information. Brainstorm quick, healthy ideas for each meal. . What can you do before baby is born to prepare meals for the postpartum period? . How can others help you with meals? Marland Kitchen Which grocery stores provide online shopping and delivery? Marland Kitchen Which restaurants offer take-out or delivery options? ______________________________________________________________________________________________________________________________________________________________________________________________________________________________________________________________________________________________________________________________________________________________________________________________________  CARE FOR MOM *It's important that mom is cared for and pampered in the postpartum period.  Remember, the most important ways new mothers need care  are: sleep, nutrition, gentle exercise, and time off.* . Who can come take care of mom during this period? Make a list of people with their contact information. . List some activities that make you feel cared for, rested, and energized? Who can make sure you have opportunities to do these things? . Does mom have a space of her very own within your home that's just for her? Make a "[facility]" where she can be comfortable, rest, and renew herself daily. ______________________________________________________________________________________________________________________________________________________________________________________________________________________________________________________________________________________________________________________________________________________________________________________________________    CARE FOR AND FEEDING BABY *Knowledgeable and encouraging people will offer the best support with regard to feeding your baby.* . Educate yourself and choose the best feeding option for your baby. . Make a list of people who will guide, support, and be a resource for you as your care for and feed your baby. (Friends that have breastfed or are currently breastfeeding, lactation consultants, breastfeeding support groups, etc.) . Consider a postpartum doula. (These websites can give you information: dona.org & BuyingShow.es) . Seek out local breastfeeding resources like the breastfeeding support group at [employer] or [employer]. ______________________________________________________________________________________________________________________________________________________________________________________________________________________________________________________________________________________________________________________________________________________________________________________________________  Verner Chol AND ERRANDS . Who can help with a thorough cleaning before baby is born? . Make a list of people who will help with housekeeping and chores, like laundry, light cleaning, dishes, bathrooms, etc. . Who can run some errands for you? Marland Kitchen What can you do to make sure you are stocked with basic supplies before baby is born? . Who is going to do the shopping? ______________________________________________________________________________________________________________________________________________________________________________________________________________________________________________________________________________________________________________________________________________________________________________________________________     Family Adjustment *Nurture yourselves.it helps parents be more loving and allows for better bonding with their child.* . What sorts of things do you and partner enjoy doing together? Which activities help you to connect and strengthen your relationship? Make a list of those things. Make a list of people whom you trust to care for your baby so you can have some time together as a couple. . What types of things help partner feel connected to Mom? Make a list. . What needs will partner have in order to bond with baby? . Other children? Who will care for them when you go into labor and while you are in the hospital? . Think about what the needs of your older children might be. Who can help you meet those needs? In what ways are you helping them prepare for bringing  baby home? List some specific strategies you have for family adjustment. _______________________________________________________________________________________________________________________________________________________________________________________________________________________________________________________________________________________________________________________________________________  SUPPORT *Someone who can empathize with experiences normalizes your problems and makes them more bearable.* . Make a list of other friends, neighbors, and/or co-workers you know with infants (and small children, if applicable) with whom you can connect. . Make a list of local or online support groups, mom groups, etc. in which you can be involved. ______________________________________________________________________________________________________________________________________________________________________________________________________________________________________________________________________________________________________________________________________________________________________________________________________  Childcare Plans . Investigate and plan for childcare if mom is returning to work. . Talk about mom's concerns about her transition back to work. . Talk about partner's concerns regarding this transition.  Mental Health *Your mental health is one of the highest priorities for a pregnant or postpartum mom.* . 1 in 5 women experience anxiety and/or depression from the time of conception through the first year after birth. . Postpartum Mood Disorders are the #1 complication of pregnancy and childbirth and the suffering experienced by these mothers is not necessary! These illnesses are temporary and respond well to treatment, which often includes self-care, social support, talk therapy, and medication when needed. . Women experiencing anxiety  and depression often say things  like: "I'm supposed to be happy.why do I feel so sad?", "Why can't I snap out of it?", "I'm having thoughts that scare me." . There is no need to be embarrassed if you are feeling these symptoms: o Overwhelmed, anxious, angry, sad, guilty, irritable, hopeless, exhausted but can't sleep o You are NOT alone. You are NOT to blame. With help, you WILL be well. . Where can I find help? Medical professionals such as your OB, midwife, gynecologist, family practitioner, primary care provider, pediatrician, or mental health providers; [facility] support groups: Feelings After Birth, Breastfeeding Support Group, Baby and Me Group, and Fit 4 Two exercise classes. . You have permission to ask for help. It will confirm your feelings, validate your experiences, share/learn coping strategies, and gain support and encouragement as you heal. You are important! BRAINSTORM . Make a list of local resources, including resources for mom and for partner. . Identify support groups. . Identify people to call late at night - include names and contact info. . Talk with partner about perinatal mood and anxiety disorders. . Talk with your OB, midwife, and doula about baby blues and about perinatal mood and anxiety disorders. . Talk with your pediatrician about perinatal mood and anxiety disorders.   Support & Sanity Savers   What do you really need?  . Basics . In preparing for a new baby, many expectant parents spend hours shopping for baby clothes, decorating the nursery, and deciding which car seat to buy. Yet most don't think much about what the reality of parenting a newborn will be like, and what they need to make it through that. So, here is the advice of experienced parents. We know you'll read this, and think "they're exaggerating, I don't really need that." Just trust [place] on these, OK? Plan for all of this, and if it turns out you don't need it, come back and teach [place] how you did it!  Satira Anis (Once  baby's survival needs are met, make sure you attend to your own survival needs!) . Sleep . An average newborn sleeps 16-18 hours per day, over 6-7 sleep periods, rarely more than three hours at a time. It is normal and healthy for a newborn to wake throughout the night... but really hard on parents!! . Naps. Prioritize sleep above any responsibilities like: cleaning house, visiting friends, running errands, etc.  Sleep whenever baby sleeps. If you can't nap, at least have restful times when baby eats. The more rest you get, the more patient you will be, the more emotionally stable, and better at solving problems.  . Food . You may not have realized it would be difficult to eat when you have a newborn. Yet, when we talk to . countless new parents, they say things like "it may be 2:00 pm when I realize I haven't had breakfast yet." Or "every time we sit down to dinner, baby needs to eat, and my food gets cold, so I don't bother to eat it." . Finger food. Before your baby is born, stock up with one months' worth of food that: 1) you can eat with one hand while holding a baby, 2) doesn't need to be prepped, 3) is good hot or cold, 4) doesn't spoil when left out for a few hours, and 5) you like to eat. Think about: nuts, dried fruit, Clif bars, pretzels, jerky, gogurt, baby carrots, apples, bananas, crackers, cheez-n-crackers, string cheese, hot pockets or frozen burritos to microwave, garden  burgers and breakfast pastries to put in the toaster, yogurt drinks, etc. . Restaurant Menus. Make lists of your favorite restaurants & menu items. When family/friends want to help, you can give specific information without much thought. They can either bring you the food or send gift cards for just the right meals. Rosaura Carpenter Meals.  Take some time to make a few meals to put in the freezer ahead of time.  Easy to freeze meals can be anything such as soup, lasagna, chicken pie, or spaghetti sauce. . Set up a Meal Schedule.   Ask friends and family to sign up to bring you meals during the first few weeks of being home. (It can be passed around at baby showers!) You have no idea how helpful this will be until you are in the throes of parenting.  https://hamilton-woodard.com/ is a great website to check out. . Emotional Support . Know who to call when you're stressed out. Parenting a newborn is very challenging work. There are times when it totally overwhelms your normal coping abilities. EVERY NEW PARENT NEEDS TO HAVE A PLAN FOR WHO TO CALL WHEN THEY JUST CAN'T COPE ANY MORE. (And it has to be someone other than the baby's other parent!) Before your baby is born, come up with at least one person you can call for support - write their phone number down and post it on the refrigerator. Marland Kitchen Anxiety & Sadness. Baby blues are normal after pregnancy; however, there are more severe types of anxiety & sadness which can occur and should not be ignored.  They are always treatable, but you have to take the first step by reaching out for help. [facility] offers a "Mom Talk" group which meets every Tuesday from 10 am - 11 am.  This group is for new moms who need support and connection after their babies are born.  Call [phone].  Marland Kitchen Really, Really Helpful (Plan for them! Make sure these happen often!!) . Physical Support with Taking Care of Yourselves . Asking friends and family. Before your baby is born, set up a schedule of people who can come and visit and help out (or ask a friend to schedule for you). Any time someone says "let me know what I can do to help," sign them up for a day. When they get there, their job is not to take care of the baby (that's your job and your joy). Their job is to take care of you!  . Postpartum doulas. If you don't have anyone you can call on for support, look into postpartum doulas:  professionals at helping parents with caring for baby, caring for themselves, getting breastfeeding started, and helping with  household tasks. www.padanc.org is a helpful website for learning about doulas in our area. . Peer Support / Parent Groups . Why: One of the greatest ideas for new parents is to be around other new parents. Parent groups give you a chance to share and listen to others who are going through the same season of life, get a sense of what is normal infant development by watching several babies learn and grow, share your stories of triumph and struggles with empathetic ears, and forgive your own mistakes when you realize all parents are learning by trial and error. . Where to find: There are many places you can meet other new parents throughout our community.  [facility] offers the following classes for new moms and their little ones:  Baby and Me (Birth to Byron)  and Breastfeeding Support Group. Go to www.conehealthybaby.com or call [phone] for more information. . Time for your Relationship . It's easy to get so caught up in meeting baby's immediate needs that it's hard to find time to connect with your partner, and meet the needs of your relationship. It's also easy to forget what "quality time with your partner" actually looks like. If you take your baby on a date, you'd be amazed how much of your couple time is spent feeding the baby, diapering the baby, admiring the baby, and talking about the baby. . Dating: Try to take time for just the two of you. Babysitter tip: Sometimes when moms are breastfeeding a newborn, they find it hard to figure out how to schedule outings around baby's unpredictable feeding schedules. Have the babysitter come for a three hour period. When she comes over, if baby has just eaten, you can leave right away, and come back in two hours. If baby hasn't fed recently, you start the date at home. Once baby gets hungry and gets a good feeding in, you can head out for the rest of your date time. . Date Nights at Home: If you can't get out, at least set aside one evening a week  to prioritize your relationship: whenever baby dozes off or doesn't have any immediate needs, spend a little time focusing on each other. . Potential conflicts: The main relationship conflicts that come up for new parents are: issues related to sexuality, financial stresses, a feeling of an unfair division of household tasks, and conflicts in parenting styles. The more you can work on these issues before baby arrives, the better!  Clint Guy and Frills (Don't forget these. and don't feel guilty for indulging in them!) . Everyone has something in life that is a fun little treat that they do just for themselves. It may be: reading the morning paper, or going for a daily jog, or having coffee with a friend once a week, or going to a movie on Friday nights, or fine chocolates, or bubble baths, or curling up with a good book. . Unless you do fun things for yourself every now and then, it's hard to have the energy for fun with your baby. Whatever your "special" treats are, make sure you find a way to continue to indulge in them after your baby is born. These special moments can recharge you, and allow you to return to baby with a new joy   PERINATAL MOOD DISORDERS: [place]   Emergency and Crisis Resources:  If you are an imminent risk to self or others, are experiencing intense personal distress, and/or have noticed significant changes in activities of daily living, call:  . 911 . [facility]: [phone] . Mobile Crisis: [phone] . National Suicide Hotline: 3133782656 Or visit the following crisis centers: . Local Emergency Departments . [facility]: [address], [place]. Hours: 8:30AM-5PM. Insurance Accepted: Medicaid, Medicare, and Uninsured.  Marland Kitchen [place], [place] Mon-Friday 8am-3pm  [phone]  Non-Crisis Resources: To identify specific providers that are covered by your insurance, contact your insurance company or local agencies: [place]: 9060181317 [facility]: [phone] [place]: [phone] Postpartum Support International- [place] 1-(224) 836-4543                                                      Outpatient therapy and medication management providers:  [facility] [phone] Hours: 9AM-5PM  Insurance Accepted: Alben Spittle, Lorella Nimrod, Freddrick March, [place], Medicare  [facility] ([place]) [phone] Hours: 8AM-5PM  nsurance Accepted: All insurances EXCEPT AARP, [place], [place], and [place]: [phone]             Hours: 8AM-8PM Insurance Accepted: Cristal Ford, Freddrick March, [place], Medicare, [phone] [facility]: [phone] Hours: 8:30AM-7PM Insurance Accepted: Cristal Ford, Medicaid, Medicare, Tricare, [employer] Counseling:  [place] Accepted:  Holland Falling, Lorella Nimrod, [employer], [place], [facility] [phone] Hours: 9AM-5:30PM Insurance Accepted: Alben Spittle, [place] Crumb, and Medicaid, Medicare, [employer] Counseling:  [phone] Hours: 9am-5pm Insurance Accepted: BCBS; they do not accept Medicaid/Medicare [place]: [phone] Hours: 9am-9pm Insurance Accepted: All major insurance including Medicaid and Medicare [facility]: [phone] Hours: 9AM-5:30PM Insurance Accepted: All insurances EXCEPT Medicaid and Medicare. [facility]: [place]:  [phone] [place]:  [place] (support for children in the NICU and/or with special needs), [place]: [phone]                                                                                     Online Resources: Postpartum Support International: http://jones-berg.com/  800-944-4PPD 2Moms Supporting Moms:  www.momssupportingmoms.net

## 2020-04-08 NOTE — BH Specialist Note (Signed)
Pt did not arrive to video visit and did not answer the phone ; Left HIPPA-compliant message to call back Aaric Dolph from [facility] [place] [facility] at [phone] (main office) or [phone] (Ayaan Shutes's office).  ; left MyChart message for patient.      

## 2020-04-13 ENCOUNTER — Other Ambulatory Visit: Payer: Self-pay | Admitting: *Deleted

## 2020-04-13 DIAGNOSIS — Z3490 Encounter for supervision of normal pregnancy, unspecified, unspecified trimester: Secondary | ICD-10-CM

## 2020-04-13 DIAGNOSIS — O26899 Other specified pregnancy related conditions, unspecified trimester: Secondary | ICD-10-CM

## 2020-04-14 ENCOUNTER — Ambulatory Visit (HOSPITAL_BASED_OUTPATIENT_CLINIC_OR_DEPARTMENT_OTHER): Payer: Medicaid Other

## 2020-04-14 ENCOUNTER — Encounter: Payer: Self-pay | Admitting: *Deleted

## 2020-04-14 ENCOUNTER — Other Ambulatory Visit: Payer: Self-pay | Admitting: *Deleted

## 2020-04-14 ENCOUNTER — Other Ambulatory Visit: Payer: Self-pay

## 2020-04-14 ENCOUNTER — Ambulatory Visit: Payer: Medicaid Other | Attending: Obstetrics and Gynecology | Admitting: *Deleted

## 2020-04-14 DIAGNOSIS — Z362 Encounter for other antenatal screening follow-up: Secondary | ICD-10-CM | POA: Insufficient documentation

## 2020-04-14 DIAGNOSIS — O43192 Other malformation of placenta, second trimester: Secondary | ICD-10-CM | POA: Insufficient documentation

## 2020-04-14 DIAGNOSIS — Z3A27 27 weeks gestation of pregnancy: Secondary | ICD-10-CM | POA: Diagnosis not present

## 2020-04-14 DIAGNOSIS — O360120 Maternal care for anti-D [Rh] antibodies, second trimester, not applicable or unspecified: Secondary | ICD-10-CM

## 2020-04-14 DIAGNOSIS — Z148 Genetic carrier of other disease: Secondary | ICD-10-CM | POA: Diagnosis not present

## 2020-04-14 DIAGNOSIS — O43199 Other malformation of placenta, unspecified trimester: Secondary | ICD-10-CM

## 2020-04-14 DIAGNOSIS — M797 Fibromyalgia: Secondary | ICD-10-CM

## 2020-04-14 DIAGNOSIS — O360190 Maternal care for anti-D [Rh] antibodies, unspecified trimester, not applicable or unspecified: Secondary | ICD-10-CM | POA: Insufficient documentation

## 2020-04-16 ENCOUNTER — Other Ambulatory Visit: Payer: Self-pay

## 2020-04-16 ENCOUNTER — Ambulatory Visit (INDEPENDENT_AMBULATORY_CARE_PROVIDER_SITE_OTHER): Payer: Medicaid Other | Admitting: Obstetrics & Gynecology

## 2020-04-16 ENCOUNTER — Other Ambulatory Visit: Payer: Medicaid Other

## 2020-04-16 VITALS — BP 110/69 | HR 83 | Wt 134.6 lb

## 2020-04-16 DIAGNOSIS — Z3490 Encounter for supervision of normal pregnancy, unspecified, unspecified trimester: Secondary | ICD-10-CM

## 2020-04-16 DIAGNOSIS — Z6791 Unspecified blood type, Rh negative: Secondary | ICD-10-CM | POA: Diagnosis not present

## 2020-04-16 DIAGNOSIS — O26892 Other specified pregnancy related conditions, second trimester: Secondary | ICD-10-CM | POA: Diagnosis not present

## 2020-04-16 DIAGNOSIS — Z23 Encounter for immunization: Secondary | ICD-10-CM

## 2020-04-16 DIAGNOSIS — Z3493 Encounter for supervision of normal pregnancy, unspecified, third trimester: Secondary | ICD-10-CM | POA: Diagnosis not present

## 2020-04-16 DIAGNOSIS — O26899 Other specified pregnancy related conditions, unspecified trimester: Secondary | ICD-10-CM

## 2020-04-16 MED ORDER — RHO D IMMUNE GLOBULIN 1500 UNIT/2ML IJ SOSY
300.0000 ug | PREFILLED_SYRINGE | Freq: Once | INTRAMUSCULAR | Status: AC
  Administered 2020-04-16: 300 ug via INTRAMUSCULAR

## 2020-04-16 MED ORDER — PANTOPRAZOLE SODIUM 20 MG PO TBEC: 20.0000 mg | DELAYED_RELEASE_TABLET | Freq: Every day | ORAL | 3 refills | Status: DC

## 2020-04-16 NOTE — Patient Instructions (Signed)

## 2020-04-17 LAB — CBC
Hematocrit: 38.4 % (ref 34.0–46.6)
Hemoglobin: 12.7 g/dL (ref 11.1–15.9)
MCH: 31.2 pg (ref 26.6–33.0)
MCHC: 33.1 g/dL (ref 31.5–35.7)
MCV: 94 fL (ref 79–97)
Platelets: 234 10*3/uL (ref 150–450)
RBC: 4.07 x10E6/uL (ref 3.77–5.28)
RDW: 12.6 % (ref 11.7–15.4)
WBC: 8.8 10*3/uL (ref 3.4–10.8)

## 2020-04-17 LAB — GLUCOSE TOLERANCE, 2 HOURS W/ 1HR
Glucose, 1 hour: 155 mg/dL (ref 65–179)
Glucose, 2 hour: 84 mg/dL (ref 65–152)
Glucose, Fasting: 66 mg/dL (ref 65–91)

## 2020-04-17 LAB — HIV ANTIBODY (ROUTINE TESTING W REFLEX): HIV Screen 4th Generation wRfx: NONREACTIVE

## 2020-04-17 LAB — RPR: RPR Ser Ql: NONREACTIVE

## 2020-04-22 ENCOUNTER — Ambulatory Visit: Payer: Medicaid Other | Admitting: Clinical

## 2020-04-22 ENCOUNTER — Other Ambulatory Visit: Payer: Self-pay

## 2020-04-22 DIAGNOSIS — Z5329 Procedure and treatment not carried out because of patient's decision for other reasons: Secondary | ICD-10-CM

## 2020-04-22 DIAGNOSIS — Z91199 Patient's noncompliance with other medical treatment and regimen due to unspecified reason: Secondary | ICD-10-CM

## 2020-05-07 ENCOUNTER — Ambulatory Visit (INDEPENDENT_AMBULATORY_CARE_PROVIDER_SITE_OTHER): Payer: Medicaid Other | Admitting: Student

## 2020-05-07 ENCOUNTER — Other Ambulatory Visit: Payer: Self-pay

## 2020-05-07 VITALS — BP 110/68 | HR 88 | Wt 143.0 lb

## 2020-05-07 DIAGNOSIS — Z3493 Encounter for supervision of normal pregnancy, unspecified, third trimester: Secondary | ICD-10-CM

## 2020-05-07 DIAGNOSIS — Z3A30 30 weeks gestation of pregnancy: Secondary | ICD-10-CM

## 2020-05-07 NOTE — Progress Notes (Signed)
   PRENATAL VISIT NOTE  Subjective:  Eileen Abbott is a 30 y.o. G1P0 at 103w2d being seen today for ongoing prenatal care.  She is currently monitored for the following issues for this low-risk pregnancy and has Rh negative status during pregnancy; Supervision of low-risk pregnancy; Fibromyalgia; Genetic carrier status; and Marginal insertion of umbilical cord affecting management of mother on their problem list.  Patient reports no complaints.  Contractions: Not present. Vag. Bleeding: None.  Movement: Present. Denies leaking of fluid.   The following portions of the patient's history were reviewed and updated as appropriate: allergies, current medications, past family history, past medical history, past social history, past surgical history and problem list.   Objective:   Vitals:   05/07/20 0827  BP: 110/68  Pulse: 88  Weight: 143 lb (64.9 kg)    Fetal Status: Fetal Heart Rate (bpm): 140 Fundal Height: 29 cm Movement: Present     General:  Alert, oriented and cooperative. Patient is in no acute distress.  Skin: Skin is warm and dry. No rash noted.   Cardiovascular: Normal heart rate noted  Respiratory: Normal respiratory effort, no problems with respiration noted  Abdomen: Soft, gravid, appropriate for gestational age.  Pain/Pressure: Absent     Pelvic: Cervical exam deferred        Extremities: Normal range of motion.  Edema: None  Mental Status: Normal mood and affect. Normal behavior. Normal judgment and thought content.   Assessment and Plan:  Pregnancy: G1P0 at [redacted]w[redacted]d 1. Encounter for supervision of low-risk pregnancy in third trimester -Lots of movement!!  -Keep going to growth scans -wants to do Post placental IUD (have medicaid) -still looking for pediatricians -declined SMA testing for partner Preterm labor symptoms and general obstetric precautions including but not limited to vaginal bleeding, contractions, leaking of fluid and fetal movement were reviewed in  detail with the patient. Please refer to After Visit Summary for other counseling recommendations.   No follow-ups on file.  Future Appointments  Date Time Provider Department Center  05/19/2020 11:15 AM [facility] NURSE [facility]  05/19/2020 11:30 AM [facility] US3 [facility]    Marylene Land, CNM

## 2020-05-07 NOTE — Patient Instructions (Addendum)
[email or web address]   AREA PEDIATRIC/FAMILY PRACTICE PHYSICIANS  [place]outheast [place] ([id number]) . [facility] Melodie Bouillon, MD; Lum Babe, MD; [place] Slider, MD; Leveda Anna, MD; McDiarmid, MD; Jerene Bears, MD; Jennette Kettle, MD; Gwendolyn Grant, MD o [address] [place] [place], [place] [address] o [phone] o Mon-Fri 8:30-12:30, 1:30-5:00 o Providers come to see babies at [facility] o Accepting Medicaid . [facility] [place] o Limited providers who accept newborns: Docia Chuck, MD; Kateri Plummer, MD; Paulino Rily, MD o [address], [place], [place] [address] o [phone] o Mon-Fri 8:00-5:30 o Babies seen by providers at [facility] o Does NOT accept Medicaid o Please call early in hospitalization for appointment (limited availability)  . [facility] Fatima Sanger, MD o [address], [place], [place] [address] o [phone] o Mon, Tue, Thur, Fri 8:30-5:00, Wed 10:00-7:00 (closed 1-2pm) o Babies seen by [facility] providers o Accepting Medicaid . Donnie Coffin - Pediatrician Fae Pippin, MD o [address], [place], [place] [address] o [phone] o Mon-Fri 8:30-5:00, Sat 8:30-12:00 o Provider comes to see babies at [facility] o Accepting Medicaid o Must have been referred from current patients or contacted office prior to delivery . [facility]) Leotis Pain, MD; Ave Filter, MD; Luna Fuse, MD; Kennedy Bucker, MD; Konrad Dolores, MD; Kathlene November, MD; Jenne Campus, MD; Lubertha South, MD; Wynetta Emery, MD; Duffy Rhody, MD; Gerre Couch, NP; Shirl Harris, NP o [address] [place], [place], [place] [address] o [phone] o Mon, Tue, Thur, Fri 8:30-5:30, Wed 9:30-5:30, Sat 8:30-12:30 o Babies seen by [facility] providers o Accepting Medicaid o Only accepting infants of first-time parents or siblings of current patients [facility] discharge coordinator will make follow-up appointment . Cyril Mourning o [address] [place], [place]  [address] o [phone]   Fax - [phone] . [facility] o [address], [place], [place]  [address] o Phone - [phone]   Fax - [phone] . Lucio Edward o [address], [place], [place]  [address] o [phone]  [place] [phone]) . [facility] Jorge Mandril, MD; Alita Chyle, MD; Princella Ion, MD; MD; Earlene Plater, MD; Jamesetta Orleans, MD; Alvera Novel, MD; Clarene Duke, MD; Rana Snare, MD; Carmon Ginsberg, MD; Alinda Money, MD; Hosie Poisson, MD; Mayford Knife, MD o [address] [place], [place] [address] o [phone] o Mon-Fri 8:30-5:00 (extended evenings Mon-Thur as needed), Sat-Sun 10:00-1:00 o Providers come to see babies at [facility] o Accepting Medicaid for families of first-time babies and families with all children in the household age 9 and under. Must register with office prior to making appointment (M-F only). [facility] Odella Aquas, NP; Lynelle Doctor, MD; Susann Givens, MD; [place], [place] o [address] [place], [place] [address] o [phone] o Mon-Fri 8:00-5:00 o Babies seen by providers at [facility] o Does NOT accept Medicaid/Commercial Insurance Only . [facility] at [place] ([facility])  Suzette Battiest, MD; Zachery Dauer, MD; Stefan Church, MD; Sabino Dick, MD; Quitman Livings, MD; Farris Has, MD; Gaynell Face, MD; Betha Loa, MD; Colon Flattery, MD; Clifton James, MD o [address] [place] [place], [place] [address] o [phone] o Mon-Fri 8:30-5:30, Sat (Oct.-Mar.) 9:00-1:00 o Babies seen by providers at [facility] o Accepting [facility] [phone]) . [facility] Dimitri, MD; Sheliah Hatch, MD o [address], [place], [place] [address] o [phone] o Mon-Fri 8:30-5:00, Sat 8:30-12:00 o Providers come to see babies at [facility] o Does NOT accept Medicaid . [facility] Cindy Hazy, [place]; Tracie Harrier, MD; [place], [place]; Wynelle Link, MD; Azucena Cecil,  MD o [address] [place], [place] [address] o [phone] o Mon-Fri 8:00-5:00 o Babies seen by providers at [facility] o Does NOT accept Medicaid o Only accepting babies of parents who are patients o Please call early in hospitalization for appointment (limited availability) . [facility] Pediatricians Lamar Benes, MD; Abran Cantor, MD; Early Osmond, MD; Cherre Huger, NP; Hyacinth Meeker, MD; Dwan Bolt, MD; Jarold Motto, NP; Dario Guardian, MD; Talmage Nap, MD; Maisie Fus, MD; Pricilla Holm, MD; Tama High, MD o [address] [place], [place], [place] [address] o [phone] o Mon-Fri 8:00-5:00, Sat 9:00-12:00 o Providers come to see babies at [facility] o Does NOT accept [facility] [phone]) . [facility] at [facility] o Limited providers accepting new patients: Drema Pry, NP; [place], PA o [address] [place], [place] [address] o [phone] o Mon-Fri 8:00-5:00 o Babies seen by providers at [facility] o Does NOT accept Medicaid o Only accepting babies of parents who are patients o Please call early in hospitalization for appointment (limited availability) . [facility] Luan Pulling, MD; Nash Dimmer, MD o [address] [place] [place], [place] [address] o [phone] (press 1 to schedule appointment) o Mon-Fri 8:00-5:00 o Providers come to see babies at [facility] o Does NOT accept Medicaid . [facility] Cristino Martes, MD o [address] [place], [place] [address] o [phone] o Mon-Fri 8:30-5:00 (lunch 12:30-1:00), extended hours by appointment only Wed 5:00-6:30 o Babies seen by [facility] providers o Accepting Medicaid . [facility] Abbot, MD; [place], MD; Hassan Rowan, MD o [address] [place], [place], [place] [address] o [phone] o Mon-Fri 8:00-5:00 o Babies seen by [facility] providers o Does NOT accept Medicaid . Nature conservation officer at [place] [address] [place] Soho, MD; Durene Cal, MD; [place], DO o [address]  [place], [place] [address] o [phone] o Mon-Fri 8:00-5:00 o Babies seen by [facility] providers o Does NOT accept Medicaid . [facility] o [place], [place]; [place], [place]; [place], NP; Avis Epley, MD; Vonna Kotyk, MD; Clance Boll, MD; Stevphen Rochester, NP; Arvilla Market, NP; Ann Maki, NP; Otis Dials, NP; Vaughan Basta, MD; [place], MD o [address] [place], [place] [address] o [phone] o Mon-Fri 8:30-5:00, Sat 10:00-1:00 o Providers come to see babies at [facility] o Does NOT accept Medicaid o Free prenatal information session Tuesdays at 4:45pm . [facility] Luna Kitchens, MD; [place], [place]; [place], [place]; [place], [place] o [address] [place] [address] o [phone] o Mon-Fri 7:30-5:30 o Babies seen by [facility] providers . [facility] Children's Doctor o [address], [place], [place]  [address] o [phone]   Fax - [phone]  [place] [phone] & [phone]) . [facility] Alphonsa Overall, MD o [address] [place], [place] [address] o [phone] o Mon-Thur 8:00-6:00 o Providers come to see babies at [facility] o Accepting Medicaid . [facility] Zenon Mayo, NP; Cyndia Bent, MD; [place], [place]; [place], [place] o [address] [place], [place] [address] o [phone] o Mon-Thur 7:30-7:30, Fri 7:30-4:30 o Babies seen by [facility] providers o Accepting Medicaid . [facility] Cheryle Horsfall, MD; Janene Harvey, NP; Vonita Moss, MD o [address], [place], [place] [address] o [phone] o Mon-Fri 8:30-5:00, Sat 8:30-12:00 o Providers come to see babies at [facility] o Accepting Medicaid o Must have "Meet & Greet" appointment at office prior to delivery . [facility] - Ginette Otto [facility]  [place]) Llana Aliment, MD; Earlene Plater, MD; Lucretia Roers, MD o [address], [place], [place] [address] o [phone] o Mon-Wed 8:00-6:00, Thur-Fri 8:00-5:00, Sat  9:00-12:00 o Providers come to see babies at [facility] o Does NOT accept Medicaid o Only accepting siblings of current patients . [facility]  o [address], [place], [place]  [address] o [phone]   Fax - [phone] . [facility] at [facility] o [phone] [address] [place], [place]  [address] o [phone]   Fax - [phone]  [place] [phone] & [phone]) . Nature conservation officer at [facility] o [place], DO; [place], DO o [address] [place], [place] [address] o [phone] o Mon-Fri 7:00-5:00 o Babies seen by [facility] providers o Does NOT accept Medicaid . [facility] Ellis Savage, MD; [place], [place]; [place], [place] o [address], [place], [place] [address] o [phone] o Mon-Fri 8:00-5:00 o Babies seen by [facility] providers o Accepting Medicaid . [facility] - [address] Franne Forts, MD; [place], [place]; [place], NP; [place], [place] o [address] [place], [place], [place] [address] o [phone] o Mon-Fri 8:00-5:00 o Babies seen by providers at [facility] o Accepting [facility] [phone]) . [facility] [place], [place] o [address] [place], [place] [address] o [phone] o Mon-Fri 8:00-5:00 o Babies seen by [facility] providers o Does NOT accept Medicaid o Limited availability, please call early in hospitalization to schedule follow-up . [facility] Jolee Ewing, PA; Eddie Candle, MD; [place], MD; [place], [place]; Constance Goltz, MD; [place], [place] o [address] [facility] [address], [place], [place] [address] o [phone] o Mon-Fri 8:30-5:00, Sat 9:00-12:00 o Babies seen by providers at [facility] o Accepting Medicaid o Please register online then schedule online or call office o [email or web address] . [facility] - [facility]  [facility]) Samuella Bruin, NP; Lucianne Muss, MD; Lanier Clam, PA o [address], [place], [place] [address] o [phone] o Mon-Fri 8:00-5:00 o Babies seen by providers at [facility] o Accepting Medicaid . [facility] ([facility]) Sharin Mons, MD; Reed Breech, NP; Shelva Majestic, MD o [address], [place], [place] [address] o [phone] o Mon-Fri 8:00-5:30, Sat&Sun by appointment (phones open at 8:30) o Babies seen by [facility] providers o Accepting Medicaid o Must be a first-time baby or sibling of current patient . [facility] [address], [place], [place]  [address] o [phone]   Fax - [phone]  [place] [phone] & [phone]) . [facility] o [place], [place]; [place], [place]; Dimple Casey, MD; [place], [place]; Carolyne Fiscal, MD o [address], [place], [place] [address] o [phone] o Mon-Thur 8:00-7:00, Fri 8:00-5:00, Sat 8:00-12:00, Sun 9:00-12:00 o Babies seen by [facility] providers o Accepting Medicaid . [facility] at [facility], MD; Gaynell Face, MD; [facility], MD o [address], [place], [place] [address] o [phone] o Mon-Thur 8:00-5:00 o Babies seen by providers at [facility] o Accepting Medicaid . [facility] at [place] Gwenlyn Saran, MD; [place], MD; Madilyn Fireman, MD; Melvyn Neth, MD; List, MD; Lazarus Salines, MD; Gaynell Face, MD; Berneda Rose, MD; Flora Lipps, MD; Beryl Meager, MD; Luther Redo, MD; Lavonia Drafts, MD; Kellie Simmering, MD o [address] [place]  [place], [place] [address] o [phone] o Mon-Fri 8:00-5:30, Sat (Oct.-Mar.) 9:00-1:00 o Babies seen by providers at [facility] o Accepting Medicaid o Must fill out new patient packet, available online at [email or web address] . [facility] - [place] [facility] at [facility]) Simone Curia, NP;  Tiburcio Pea, NP; Tresa Endo, NP; Whitney Post, MD; [place], [place]; Hennie Duos, MD; Wynne Dust, MD; Kavin Leech, NP o [address], [place], [place] [address] o [phone] o Mon-Thur 8:00-5:30, Fri 8:00-5:00 o Babies seen by providers at [facility] o Accepting [facility] [phone]) . [facility] Medicine o [place], [place]; [place], MD; Tanya Nones, MD; [place], [place] o [address] [place], [place] [address] o [phone] o Mon-Fri 8:00-5:00 o Babies seen by providers at [facility] o Accepting [facility] [phone]) . [facility] at [facility] o [place], DO; Lenise Arena, MD; [place], [place] o [address], [place], [place] [address] o [phone] o Mon-Fri 8:00-5:00 o Babies seen by providers at [facility] o Does NOT accept Medicaid o Limited appointment availability, please call early in hospitalization  . Nature conservation officer at [facility] o [place], DO; [place], MD o [address], [place], [place] [address] o [phone] o Mon-Fri 8:00-5:00 o Babies seen by [facility] providers o Does NOT accept Medicaid . [facility] Lorrine Kin, MD; Ninetta Lights, MD; [place], [place]; [place], MD o [address] [facility] [address] [facility] [address], [place], [place] [address] o [phone] o Mon-Fri 8:00-5:00 o After hours clinic [facility] [place], [place] [address]) [phone] Mon-Fri 5:00-8:00, Sat 12:00-6:00, Sun 10:00-4:00 o Babies seen by [facility] providers o Accepting Medicaid . [facility] at [facility] o [address] [place], [place]  [address] o [phone]   Fax - [phone]  [place] [phone]) . Nature conservation officer at [facility], MD o [address] [place], [place], [place] [address] o [phone] o Mon-Fri 8:00-5:00 o Babies seen by [facility] providers o Does NOT accept Medicaid . [facility] - [place] [facility] at [place]) Tomi Likens, MD o [address] [place] [address], [place], [place] [address] o [phone] o Mon-Thur 8:00-7:00, Fri 8:00-5:00, Sat 8:00-12:00 o Babies seen by providers at [facility] o Accepting Medicaid - but does not have vaccinations in office (must be received elsewhere) o Limited availability, please call early in hospitalization  [place] ([phone]) . [facility]  o Wyvonne Lenz, MD o [address], [place] [address] o [phone]  Fax [phone]

## 2020-05-19 ENCOUNTER — Ambulatory Visit: Payer: Medicaid Other | Attending: Obstetrics

## 2020-05-19 ENCOUNTER — Other Ambulatory Visit: Payer: Self-pay

## 2020-05-19 ENCOUNTER — Encounter: Payer: Self-pay | Admitting: *Deleted

## 2020-05-19 ENCOUNTER — Ambulatory Visit: Payer: Medicaid Other | Admitting: *Deleted

## 2020-05-19 DIAGNOSIS — O43193 Other malformation of placenta, third trimester: Secondary | ICD-10-CM

## 2020-05-19 DIAGNOSIS — Z148 Genetic carrier of other disease: Secondary | ICD-10-CM

## 2020-05-19 DIAGNOSIS — O360130 Maternal care for anti-D [Rh] antibodies, third trimester, not applicable or unspecified: Secondary | ICD-10-CM | POA: Diagnosis not present

## 2020-05-19 DIAGNOSIS — Z362 Encounter for other antenatal screening follow-up: Secondary | ICD-10-CM

## 2020-05-19 DIAGNOSIS — O43192 Other malformation of placenta, second trimester: Secondary | ICD-10-CM | POA: Diagnosis not present

## 2020-05-19 DIAGNOSIS — O43199 Other malformation of placenta, unspecified trimester: Secondary | ICD-10-CM

## 2020-05-19 DIAGNOSIS — M797 Fibromyalgia: Secondary | ICD-10-CM

## 2020-05-19 DIAGNOSIS — Z3493 Encounter for supervision of normal pregnancy, unspecified, third trimester: Secondary | ICD-10-CM | POA: Insufficient documentation

## 2020-05-19 DIAGNOSIS — Z3A32 32 weeks gestation of pregnancy: Secondary | ICD-10-CM

## 2020-05-29 ENCOUNTER — Telehealth (INDEPENDENT_AMBULATORY_CARE_PROVIDER_SITE_OTHER): Payer: Medicaid Other | Admitting: Student

## 2020-05-29 DIAGNOSIS — Z3A36 36 weeks gestation of pregnancy: Secondary | ICD-10-CM

## 2020-05-29 DIAGNOSIS — Z3493 Encounter for supervision of normal pregnancy, unspecified, third trimester: Secondary | ICD-10-CM

## 2020-05-29 DIAGNOSIS — O43199 Other malformation of placenta, unspecified trimester: Secondary | ICD-10-CM

## 2020-05-29 DIAGNOSIS — O43193 Other malformation of placenta, third trimester: Secondary | ICD-10-CM | POA: Diagnosis not present

## 2020-05-29 NOTE — Progress Notes (Signed)
I connected with  Eileen Abbott on 05/29/20 at 10:55 AM EDT by telephone and verified that I am speaking with the correct person using two identifiers. I called from [facility] & she was home.   I discussed the limitations, risks, security and privacy concerns of performing an evaluation and management service by telephone and the availability of in person appointments. I also discussed with the patient that there may be a patient responsible charge related to this service. The patient expressed understanding and agreed to proceed.  Henrietta Dine, CMA 05/29/2020  11:12 AM

## 2020-05-29 NOTE — Progress Notes (Signed)
    TELEHEALTH OBSTETRICS VISIT ENCOUNTER NOTE  Provider location: [facility] [employer] [facility]   I connected with [name] on 05/29/20 at 10:55 AM EDT by telephone at home and verified that I am speaking with the correct person using two identifiers. Of note, unable to do video encounter due to technical difficulties.    I discussed the limitations, risks, security and privacy concerns of performing an evaluation and management service by telephone and the availability of in person appointments. I also discussed with the patient that there may be a patient responsible charge related to this service. The patient expressed understanding and agreed to proceed.  Subjective:  Eileen Abbott is a 30 y.o. G1P0 at [redacted]w[redacted]d being followed for ongoing prenatal care.  She is currently monitored for the following issues for this low-risk pregnancy and has Rh negative status during pregnancy; Supervision of low-risk pregnancy; Fibromyalgia; Genetic carrier status; and Marginal insertion of umbilical cord affecting management of mother on their problem list.  Patient reports no complaints. Reports fetal movement. Denies any contractions, bleeding or leaking of fluid.   The following portions of the patient's history were reviewed and updated as appropriate: allergies, current medications, past family history, past medical history, past social history, past surgical history and problem list.   Objective:  Last menstrual period 10/01/2019. General:  Alert, oriented and cooperative.   Mental Status: Normal mood and affect perceived. Normal judgment and thought content.  Rest of physical exam deferred due to type of encounter  Assessment and Plan:  Pregnancy: G1P0 at [redacted]w[redacted]d 1. Encounter for supervision of low-risk pregnancy in third trimester -pt unable to log on to web ex due to wifi issues - telephone visit today. Will have in person visits for remainder of  pregnancy -doing well. Answered questions regarding visitation policy and pain management in labor.  -discussed upcoming appt with GBS & GC/CT swabs  2. Marginal insertion of umbilical cord affecting management of mother -normal growth ultrasound on 3/8. No further scans recommended per MFM  3. [redacted] weeks gestation of pregnancy   Preterm labor symptoms and general obstetric precautions including but not limited to vaginal bleeding, contractions, leaking of fluid and fetal movement were reviewed in detail with the patient.  I discussed the assessment and treatment plan with the patient. The patient was provided an opportunity to ask questions and all were answered. The patient agreed with the plan and demonstrated an understanding of the instructions. The patient was advised to call back or seek an in-person office evaluation/go to [facility]U at [facility] for any urgent or concerning symptoms. Please refer to After Visit Summary for other counseling recommendations.   I provided 9 minutes of non-face-to-face time during this encounter.  Return in about 19 days (around 06/17/2020) for Routine OB, in person.  No future appointments.  Judeth Horn, NP [facility], [facility]

## 2020-05-29 NOTE — Patient Instructions (Signed)
Creating a Birth Plan  Planning the details of the type of birth experience you would like to have can help you feel more at ease about labor and childbirth. By having a written plan, you will feel more prepared as your due date gets closer. What is a birth plan? A birth plan is a written document that lets your health care providers know your preferences during labor and delivery. Your birth plan can include your preferences for pain management, your support persons, and your preferred birthing position. When the plan is complete, it will become part of your medical chart. How do I create a birth plan? Be flexible when creating a birth plan. Your plan may need to change to keep you and your baby safe. If your health care provider is not receptive to some of your ideas, he or she may be concerned about certain risks. Every birth is different and the definition of a "normal" birth varies, so try to use terms and phrases like "birth preferences," "our wishes," or "as long as birth progresses normally." Doing research  Resources to help write your plan include: ? Your health care provider. ? Reliable sources about labor and delivery and available options. ? Friends and relatives who have given birth.  You may also want to: ? Sign up for birthing classes. These are usually offered at hospitals or birthing centers. ? Consider whether you would like to hire a trained professional to assist and support you during labor and birth. ? Learn about the routine newborn care given where you plan to give birth. This may include a vitamin K shot, antibiotic eye ointment, hepatitis B vaccination, and other screening tests. Creating and using the plan  Check out different birth plan templates online. Choose one that works for you, or ask your health care provider to recommend one.  Take your plan to your prenatal visit and review it with your health care provider.  Once you and your health care provider are  comfortable with the plan, include it in your medical record. Keep a few printed copies for yourself and anyone else who may be involved in your labor and delivery.  Bring a copy of your plan with you when you arrive at the hospital or birthing center. What should I include in a birth plan? There are many preferences you can include in your plan. Birthing centers and hospitals may not offer all of these options. Here are some things to consider: Labor room and personal choices  Who do you want with you in your labor room? This might include your partner, family members, friends, or your doula.  Your preferences for pain management. Do you want to try to have an unmedicated childbirth?  Alternative therapies to help you cope with labor such as music, aromatherapy, acupuncture, hydrotherapy, or guided relaxation.  Religious or cultural preferences or traditions.  Do you want to be able to eat or drink?  Do you want to be able to walk and move around freely?  Do you want to have pictures or a video taken during birth? Medical decisions  Do you want to be given a medicine to induce labor?  Which types of pain management medicines do you prefer? Options might include IV medicines, epidural medicine, or nitrous oxide. At what point would you consider medicines?  Your thoughts about accepting blood or blood products if needed.  What type of fetal monitoring do you prefer? Options may include continuous or intermittent, internal, or external.  Do   you want to have an IV started? Placing a saline lock will allow an IV to be attached when needed.  In the case of a difficult second stage of labor, are you comfortable having a surgical cut to your perineum (episiotomy)? You may ask your health care provider to do an episiotomy only if absolutely necessary.  If a cesarean delivery is needed, who do you want in the room with you?  What positions do you want to try during labor? Options might  include standing, squatting, or walking. After the baby is born  What are your preferences for cutting the umbilical cord? Options may include leaving the cord attached for a short time after birth and having your partner cut the cord.  Do you want skin-to-skin contact with your baby right after birth? You may have your baby examined on your chest. You or your partner may be able to be with your baby for all of the routine newborn procedures.  Do you want to start breastfeeding as soon as possible?  Do you want your baby to stay in your room with you while you are in the hospital?  Are there any routine newborn treatments or screening tests that you do not want your baby to have? Questions to ask your health care provider  Where can I find reliable information to help me create a birth plan?  Do any parts of my birth plan go against the policies of the hospital or birthing center?  Are there any other options I should consider?  How might problems during labor or delivery change the plan?  Are you comfortable with my plan? For more information:  U.S. Department of Health and Human Services: womenshealth.gov  American Pregnancy Association: americanpregnancy.org  March of Dimes 2020 Birth Plan: marchofdimes.org Summary  A birth plan lets your health care providers know what type of birth experience you hope to have.  Research all of your options before you write your birth plan.  Be flexible when creating a birth plan. Your plan may need to change to keep you and your baby safe.  Review your birth plan with your health care provider. When the plan is complete, it will become part of your medical chart. This information is not intended to replace advice given to you by your health care provider. Make sure you discuss any questions you have with your health care provider. Document Revised: 12/23/2019 Document Reviewed: 12/23/2019 Elsevier Patient Education  2021 Elsevier  Inc.  

## 2020-05-29 NOTE — Progress Notes (Signed)
10:59a- Called Pt for My Chart visit, no answer,left Vm for call back & that I will retry in 10 to 15 minutes.

## 2020-06-18 ENCOUNTER — Other Ambulatory Visit: Payer: Self-pay

## 2020-06-18 ENCOUNTER — Ambulatory Visit (INDEPENDENT_AMBULATORY_CARE_PROVIDER_SITE_OTHER): Payer: Medicaid Other | Admitting: Student

## 2020-06-18 ENCOUNTER — Other Ambulatory Visit (HOSPITAL_COMMUNITY)
Admission: RE | Admit: 2020-06-18 | Discharge: 2020-06-18 | Disposition: A | Discharge status: Home / Self Care | Payer: Medicaid Other | Source: Ambulatory Visit | Attending: Student | Admitting: Student

## 2020-06-18 VITALS — BP 125/76 | HR 92 | Wt 155.5 lb

## 2020-06-18 DIAGNOSIS — Z3A36 36 weeks gestation of pregnancy: Secondary | ICD-10-CM

## 2020-06-18 DIAGNOSIS — Z3493 Encounter for supervision of normal pregnancy, unspecified, third trimester: Secondary | ICD-10-CM | POA: Insufficient documentation

## 2020-06-18 NOTE — Progress Notes (Signed)
   PRENATAL VISIT NOTE  Subjective:  Eileen Abbott is a 30 y.o. G1P0 at [redacted]w[redacted]d being seen today for ongoing prenatal care.  She is currently monitored for the following issues for this low-risk pregnancy and has Rh negative status during pregnancy; Supervision of low-risk pregnancy; Fibromyalgia; Genetic carrier status; and Marginal insertion of umbilical cord affecting management of mother on their problem list.  Patient reports heartburn. Swollen feet at times. Also reports snoring.  Contractions: Not present. Vag. Bleeding: None.  Movement: Present. Denies leaking of fluid.   The following portions of the patient's history were reviewed and updated as appropriate: allergies, current medications, past family history, past medical history, past social history, past surgical history and problem list.   Objective:   Vitals:   06/18/20 1511  BP: 125/76  Pulse: 92  Weight: 155 lb 8 oz (70.5 kg)    Fetal Status: Fetal Heart Rate (bpm): 142 Fundal Height: 35 cm Movement: Present  Presentation: Vertex  General:  Alert, oriented and cooperative. Patient is in no acute distress.  Skin: Skin is warm and dry. No rash noted.   Cardiovascular: Normal heart rate noted  Respiratory: Normal respiratory effort, no problems with respiration noted  Abdomen: Soft, gravid, appropriate for gestational age.  Pain/Pressure: Absent     Pelvic: Cervical exam performed in the presence of a chaperone Dilation: Closed Effacement (%): Thick    Extremities: Normal range of motion.  Edema: None  Mental Status: Normal mood and affect. Normal behavior. Normal judgment and thought content.   Assessment and Plan:  Pregnancy: G1P0 at [redacted]w[redacted]d 1. Encounter for supervision of low-risk pregnancy in third trimester -gc ct/GBS collected today -reviewed eating plan for heartburn, optimal meal spacing, sleeping in recliner -reassured that swelling is normal and with normal -confriemd vertex with [place]  Term labor symptoms  and general obstetric precautions including but not limited to vaginal bleeding, contractions, leaking of fluid and fetal movement were reviewed in detail with the patient. Please refer to After Visit Summary for other counseling recommendations.   Return in about 2 weeks (around 07/02/2020), or LROB for KK.  No future appointments.  Marylene Land, CNM

## 2020-06-19 LAB — CERVICOVAGINAL ANCILLARY ONLY
Bacterial Vaginitis (gardnerella): NEGATIVE
Candida Glabrata: NEGATIVE
Candida Vaginitis: NEGATIVE
Chlamydia: NEGATIVE
Comment: NEGATIVE
Comment: NEGATIVE
Comment: NEGATIVE
Comment: NEGATIVE
Comment: NEGATIVE
Comment: NORMAL
Neisseria Gonorrhea: NEGATIVE
Trichomonas: NEGATIVE

## 2020-06-22 LAB — CULTURE, BETA STREP (GROUP B ONLY): Strep Gp B Culture: NEGATIVE

## 2020-07-02 ENCOUNTER — Encounter: Payer: Self-pay | Admitting: Nurse Practitioner

## 2020-07-02 ENCOUNTER — Other Ambulatory Visit: Payer: Self-pay

## 2020-07-02 ENCOUNTER — Ambulatory Visit (INDEPENDENT_AMBULATORY_CARE_PROVIDER_SITE_OTHER): Payer: Medicaid Other | Admitting: Nurse Practitioner

## 2020-07-02 VITALS — BP 125/83 | HR 107 | Wt 150.9 lb

## 2020-07-02 DIAGNOSIS — Z3493 Encounter for supervision of normal pregnancy, unspecified, third trimester: Secondary | ICD-10-CM

## 2020-07-02 DIAGNOSIS — O26893 Other specified pregnancy related conditions, third trimester: Secondary | ICD-10-CM

## 2020-07-02 DIAGNOSIS — Z3A38 38 weeks gestation of pregnancy: Secondary | ICD-10-CM

## 2020-07-02 DIAGNOSIS — R12 Heartburn: Secondary | ICD-10-CM

## 2020-07-02 MED ORDER — PANTOPRAZOLE SODIUM 40 MG PO TBEC: 40.0000 mg | DELAYED_RELEASE_TABLET | Freq: Every day | ORAL | 0 refills | Status: DC

## 2020-07-02 NOTE — Patient Instructions (Signed)

## 2020-07-02 NOTE — Progress Notes (Signed)
    Subjective:  Eileen Abbott is a 30 y.o. G1P0 at [redacted]w[redacted]d being seen today for ongoing prenatal care.  She is currently monitored for the following issues for this low-risk pregnancy and has Rh negative status during pregnancy; Supervision of low-risk pregnancy; Fibromyalgia; Genetic carrier status; and Marginal insertion of umbilical cord affecting management of mother on their problem list.  Patient reports heartburn.  Contractions: Irritability. Vag. Bleeding: None.  Movement: Present. Denies leaking of fluid.   The following portions of the patient's history were reviewed and updated as appropriate: allergies, current medications, past family history, past medical history, past social history, past surgical history and problem list. Problem list updated.  Objective:   Vitals:   07/02/20 0948  BP: 125/83  Pulse: (!) 107  Weight: 150 lb 14.4 oz (68.4 kg)    Fetal Status: Fetal Heart Rate (bpm): 138 Fundal Height: 36 cm Movement: Present  Presentation: Vertex  General:  Alert, oriented and cooperative. Patient is in no acute distress.  Skin: Skin is warm and dry. No rash noted.   Cardiovascular: Normal heart rate noted  Respiratory: Normal respiratory effort, no problems with respiration noted  Abdomen: Soft, gravid, appropriate for gestational age. Pain/Pressure: Present     Pelvic:  Cervical exam performed Dilation: Closed Effacement (%): 40 Station: -2  Cervix is very posterior and soft  Extremities: Normal range of motion.  Edema: Trace  Mental Status: Normal mood and affect. Normal behavior. Normal judgment and thought content.   Urinalysis:      Assessment and Plan:  Pregnancy: G1P0 at [redacted]w[redacted]d  1. Encounter for supervision of low-risk pregnancy in third trimester Doing well.  Feeling good.  Baby moving well. No edema   2. Heartburn during pregnancy in third trimester Prescribed medication as heartburn is worsening   Term labor symptoms and general obstetric  precautions including but not limited to vaginal bleeding, contractions, leaking of fluid and fetal movement were reviewed in detail with the patient. Please refer to After Visit Summary for other counseling recommendations.  Return in about 1 week (around 07/09/2020).  Nolene Bernheim, RN, MSN, NP-BC Nurse Practitioner, [facility] [employer], [facility] 07/02/2020 10:06 AM

## 2020-07-03 ENCOUNTER — Inpatient Hospital Stay (HOSPITAL_COMMUNITY)
Admission: AD | Admit: 2020-07-03 | Discharge: 2020-07-03 | Disposition: A | Discharge status: Home / Self Care | Payer: Medicaid Other | Attending: Obstetrics and Gynecology | Admitting: Obstetrics and Gynecology

## 2020-07-03 ENCOUNTER — Other Ambulatory Visit: Payer: Self-pay

## 2020-07-03 ENCOUNTER — Encounter (HOSPITAL_COMMUNITY): Payer: Self-pay | Admitting: Obstetrics and Gynecology

## 2020-07-03 DIAGNOSIS — Z3493 Encounter for supervision of normal pregnancy, unspecified, third trimester: Secondary | ICD-10-CM

## 2020-07-03 DIAGNOSIS — M797 Fibromyalgia: Secondary | ICD-10-CM

## 2020-07-03 DIAGNOSIS — R1033 Periumbilical pain: Secondary | ICD-10-CM

## 2020-07-03 DIAGNOSIS — O26893 Other specified pregnancy related conditions, third trimester: Secondary | ICD-10-CM | POA: Diagnosis not present

## 2020-07-03 DIAGNOSIS — O43199 Other malformation of placenta, unspecified trimester: Secondary | ICD-10-CM

## 2020-07-03 DIAGNOSIS — Z3A38 38 weeks gestation of pregnancy: Secondary | ICD-10-CM | POA: Diagnosis not present

## 2020-07-03 DIAGNOSIS — Z3689 Encounter for other specified antenatal screening: Secondary | ICD-10-CM | POA: Insufficient documentation

## 2020-07-03 LAB — URINALYSIS, ROUTINE W REFLEX MICROSCOPIC
Bilirubin Urine: NEGATIVE
Glucose, UA: NEGATIVE mg/dL
Hgb urine dipstick: NEGATIVE
Ketones, ur: 20 mg/dL — AB
Leukocytes,Ua: NEGATIVE
Nitrite: NEGATIVE
Protein, ur: NEGATIVE mg/dL
Specific Gravity, Urine: 1.021 (ref 1.005–1.030)
pH: 6 (ref 5.0–8.0)

## 2020-07-03 LAB — POCT FERN TEST: POCT Fern Test: NEGATIVE

## 2020-07-03 MED ORDER — ACETAMINOPHEN 500 MG PO TABS
1000.0000 mg | ORAL_TABLET | Freq: Once | ORAL | Status: AC
  Administered 2020-07-03: 1000 mg via ORAL
  Filled 2020-07-03: qty 2

## 2020-07-03 MED ORDER — LACTATED RINGERS IV BOLUS
1000.0000 mL | Freq: Once | INTRAVENOUS | Status: AC
  Administered 2020-07-03: 1000 mL via INTRAVENOUS

## 2020-07-03 MED ORDER — CYCLOBENZAPRINE HCL 5 MG PO TABS
10.0000 mg | ORAL_TABLET | Freq: Once | ORAL | Status: AC
  Administered 2020-07-03: 10 mg via ORAL
  Filled 2020-07-03: qty 2

## 2020-07-03 NOTE — MAU Note (Signed)
Presents with c/o "severe" pain in upper right quadrant of abdomen that began last night.  Reports took Tylenol @ 0500 this morning, no relief.  Denies VB or LOF.  Endorses +FM.

## 2020-07-03 NOTE — Discharge Instructions (Signed)

## 2020-07-03 NOTE — MAU Provider Note (Signed)
History     CSN: [id number]  Arrival date and time: 07/03/20 4098  Event Date/Time  First Provider Initiated Contact with Patient 07/03/20 1227      Chief Complaint  Patient presents with  . Abdominal Pain   HPI Eileen Abbott is a 30 y.o. G1P0 at [redacted]w[redacted]d who presents to MAU with chief complaint of abdominal pain on her right side, near her umbilicus. This is a new complaint, onset last night. Patient states it feels as if the baby kicked her but instead of feeling that sharp kicking pain its now a sustained sore area. Pain score is 8/10. Pain does not radiate. She took 1G of Tylenol at 0500 today but did not experience relief. She does experience some relief by stretching her torso with both arms above her head.   Patient also reports leaking of fluid, new onset this morning. Leaking is not continuous but has happened multiple times today.  She denies vaginal bleeding, leaking of fluid, decreased fetal movement, fever, falls, or recent illness.   OB History    Gravida  1   Para      Term      Preterm      AB      Living        SAB      IAB      Ectopic      Multiple      Live Births              Past Medical History:  Diagnosis Date  . Asthma   . Attention deficit disorder   . Fibromyalgia     Past Surgical History:  Procedure Laterality Date  . NO PAST SURGERIES      Family History  Problem Relation Age of Onset  . Diabetes Mother   . Arthritis Mother   . Fibromyalgia Mother   . Irritable bowel syndrome Father   . Vitiligo Father     Social History   Tobacco Use  . Smoking status: Former Smoker    Packs/day: 0.50    Years: 7.00    Pack years: 3.50    Types: Cigarettes  . Smokeless tobacco: Never Used  . Tobacco comment: stopped with pregnancy  Vaping Use  . Vaping Use: Never used  Substance Use Topics  . Alcohol use: Not Currently    Comment: socially  . Drug use: Not Currently    Types: Marijuana    Comment: daily for  fibromyalgia , stopped with pregnancy    Allergies:  Allergies  Allergen Reactions  . Banana Nausea And Vomiting  . Eggs Or Egg-Derived Products Nausea And Vomiting    No medications prior to admission.    Review of Systems  Gastrointestinal: Positive for abdominal pain.  Genitourinary: Positive for vaginal discharge.  All other systems reviewed and are negative.  Physical Exam   Blood pressure 112/67, pulse 89, temperature 97.8 F (36.6 C), temperature source Oral, resp. rate 20, height 5' (1.524 m), weight 67.7 kg, last menstrual period 10/01/2019, SpO2 98 %.  Physical Exam Vitals and nursing note reviewed. Exam conducted with a chaperone present.  Constitutional:      Appearance: She is well-developed. She is not ill-appearing.  Abdominal:     Palpations: Abdomen is soft.     Tenderness: There is no abdominal tenderness. There is no right CVA tenderness or left CVA tenderness.    Genitourinary:    Comments: SSE: cervix visually closed, negative pooling, negative fern Skin:  General: Skin is warm and dry.  Neurological:     Mental Status: She is alert.    MAU Course  Procedures  --Pain reduced from 8 to 2 with Tylenol and Flexeril --Reactive tracing: baseline 135, mod var, + accels, no decels --Tooc: Irregular ctx q 4-9, resolved with fluid bolus --Low suspicion for involvement of liver. No preexisting risk factors. Discussed blood work for pain not managed by medication. Patient happy with resulting pain score, declines blood work --Pertinent negatives: DFM, Cat II tracing, abdominal tenderness, vaginal bleeding  Orders Placed This Encounter  Procedures  . Urinalysis, Routine w reflex microscopic Urine, Clean Catch  . Fern Test  . Discharge patient   Patient Vitals for the past 24 hrs:  BP Temp Temp src Pulse Resp SpO2 Height Weight  07/03/20 1248 112/67 -- -- 89 -- -- -- --  07/03/20 1019 112/75 97.8 F (36.6 C) Oral (!) 110 20 98 % -- --  07/03/20  1015 -- -- -- -- -- -- 5' (1.524 m) 67.7 kg   Meds ordered this encounter  Medications  . cyclobenzaprine (FLEXERIL) tablet 10 mg  . lactated ringers bolus 1,000 mL  . acetaminophen (TYLENOL) tablet 1,000 mg   Assessment and Plan  --30 y.o. G1P0 at [redacted]w[redacted]d  --Reactive tracing --Closed cervix --Intact amniotic sac --Pain score 2/10 at discharge --Discharge home in stable condition  F/U: --Next appointment at [facility] is 07/10/2020  Calvert Cantor, CNM 07/03/2020, 7:02 PM

## 2020-07-03 NOTE — MAU Note (Signed)
Called lab to check on status of UA that was sent at 1020. Jasmine December stated she would run the sample now.

## 2020-07-09 ENCOUNTER — Encounter (HOSPITAL_COMMUNITY): Payer: Self-pay | Admitting: Family Medicine

## 2020-07-09 ENCOUNTER — Other Ambulatory Visit: Payer: Self-pay

## 2020-07-09 ENCOUNTER — Inpatient Hospital Stay (HOSPITAL_COMMUNITY)
Admission: AD | Admit: 2020-07-09 | Discharge: 2020-07-09 | Disposition: A | Discharge status: Home / Self Care | Payer: Medicaid Other | Attending: Family Medicine | Admitting: Family Medicine

## 2020-07-09 DIAGNOSIS — O43199 Other malformation of placenta, unspecified trimester: Secondary | ICD-10-CM

## 2020-07-09 DIAGNOSIS — Z3689 Encounter for other specified antenatal screening: Secondary | ICD-10-CM | POA: Insufficient documentation

## 2020-07-09 DIAGNOSIS — M797 Fibromyalgia: Secondary | ICD-10-CM

## 2020-07-09 DIAGNOSIS — O471 False labor at or after 37 completed weeks of gestation: Secondary | ICD-10-CM | POA: Diagnosis not present

## 2020-07-09 DIAGNOSIS — Z3493 Encounter for supervision of normal pregnancy, unspecified, third trimester: Secondary | ICD-10-CM

## 2020-07-09 DIAGNOSIS — Z3A39 39 weeks gestation of pregnancy: Secondary | ICD-10-CM | POA: Insufficient documentation

## 2020-07-09 NOTE — MAU Note (Signed)
Eileen Abbott is a 30 y.o. at [redacted]w[redacted]d here in MAU reporting: contractions since 1400. They are about every 2.5 minutes. No LOF. No bleeding. +FM  Onset of complaint: today  Pain score: 7/10  Vitals:   07/09/20 1646  BP: 127/81  Pulse: 88  Resp: 18  Temp: 98.1 F (36.7 C)  SpO2: 98%     FHT:147  Lab orders placed from triage: none

## 2020-07-09 NOTE — Discharge Instructions (Signed)

## 2020-07-09 NOTE — MAU Provider Note (Signed)
S: Ms. Eileen Abbott is a 30 y.o. G1P0 at [redacted]w[redacted]d  who presents to MAU today for labor evaluation.     Cervical exam by RN:  Dilation: Closed Station: -2 Exam by:: Erle Crocker, RN  Fetal Monitoring: reactive Baseline: 135 Variability: moderate Accelerations: 15x15 Decelerations: none Contractions: q2-8  MDM Discussed patient with RN. NST reviewed.   A: SIUP at [redacted]w[redacted]d  False labor  P: Discharge home Labor precautions and kick counts included in AVS Patient to follow-up with [facility] as scheduled  Patient may return to MAU as needed or when in labor   Bernerd Limbo, [place] 07/09/2020 5:44 PM

## 2020-07-10 ENCOUNTER — Ambulatory Visit (INDEPENDENT_AMBULATORY_CARE_PROVIDER_SITE_OTHER): Payer: Medicaid Other | Admitting: Obstetrics and Gynecology

## 2020-07-10 VITALS — BP 134/89 | HR 99 | Wt 157.6 lb

## 2020-07-10 DIAGNOSIS — O43199 Other malformation of placenta, unspecified trimester: Secondary | ICD-10-CM

## 2020-07-10 DIAGNOSIS — Z3493 Encounter for supervision of normal pregnancy, unspecified, third trimester: Secondary | ICD-10-CM

## 2020-07-10 DIAGNOSIS — Z3A39 39 weeks gestation of pregnancy: Secondary | ICD-10-CM

## 2020-07-10 NOTE — Patient Instructions (Signed)

## 2020-07-10 NOTE — Progress Notes (Signed)
   PRENATAL VISIT NOTE  Subjective:  Eileen Abbott is a 30 y.o. G1P0 at [redacted]w[redacted]d being seen today for ongoing prenatal care.  She is currently monitored for the following issues for this low-risk pregnancy and has Rh negative status during pregnancy; Supervision of low-risk pregnancy; Fibromyalgia; Genetic carrier status; and Marginal insertion of umbilical cord affecting management of mother on their problem list.  Patient reports no complaints.  Contractions: Irritability. Vag. Bleeding: None.  Movement: Present. Denies leaking of fluid.   Feeling contractions on and off, ready to meet baby.    The following portions of the patient's history were reviewed and updated as appropriate: allergies, current medications, past family history, past medical history, past social history, past surgical history and problem list.   Objective:   Vitals:   07/10/20 0959  BP: 134/89  Pulse: 99  Weight: 157 lb 9.6 oz (71.5 kg)    Fetal Status: Fetal Heart Rate (bpm): 128   Movement: Present     General:  Alert, oriented and cooperative. Patient is in no acute distress.  Skin: Skin is warm and dry. No rash noted.   Cardiovascular: Normal heart rate noted  Respiratory: Normal respiratory effort, no problems with respiration noted  Abdomen: Soft, gravid, appropriate for gestational age.  Pain/Pressure: Present     Pelvic: Cervical exam deferred        Extremities: Normal range of motion.  Edema: Trace  Mental Status: Normal mood and affect. Normal behavior. Normal judgment and thought content.   Assessment and Plan:  Pregnancy: G1P0 at [redacted]w[redacted]d 1. Encounter for supervision of low-risk pregnancy in third trimester -counseled on labor precautions, declined cervical exam but was checked yesterday and cervix was closed. Counseled on cervical ripening, etc.  -having baby boy, declines circ. Desires inpatient iud   2. Marginal insertion of umbilical cord affecting management of mother    3. [redacted] weeks  gestation of pregnancy -IOL scheduled for 5/10.   Term labor symptoms and general obstetric precautions including but not limited to vaginal bleeding, contractions, leaking of fluid and fetal movement were reviewed in detail with the patient. Please refer to After Visit Summary for other counseling recommendations.   Return in about 1 week (around 07/17/2020) for OB, any provider.  Future Appointments  Date Time Provider Department Center  07/17/2020  8:30 AM Kathlene Cote [facility]    Gita Kudo, MD

## 2020-07-13 ENCOUNTER — Other Ambulatory Visit: Payer: Self-pay | Admitting: Advanced Practice Midwife

## 2020-07-14 ENCOUNTER — Other Ambulatory Visit: Payer: Self-pay

## 2020-07-14 ENCOUNTER — Telehealth (HOSPITAL_COMMUNITY): Payer: Self-pay | Admitting: *Deleted

## 2020-07-14 ENCOUNTER — Encounter (HOSPITAL_COMMUNITY): Payer: Self-pay | Admitting: Obstetrics and Gynecology

## 2020-07-14 ENCOUNTER — Inpatient Hospital Stay (HOSPITAL_COMMUNITY)
Admission: AD | Admit: 2020-07-14 | Discharge: 2020-07-16 | DRG: 807 | Disposition: A | Discharge status: Home / Self Care | Payer: Medicaid Other | Attending: Obstetrics and Gynecology | Admitting: Obstetrics and Gynecology

## 2020-07-14 DIAGNOSIS — Z3490 Encounter for supervision of normal pregnancy, unspecified, unspecified trimester: Secondary | ICD-10-CM

## 2020-07-14 DIAGNOSIS — O26893 Other specified pregnancy related conditions, third trimester: Secondary | ICD-10-CM | POA: Diagnosis present

## 2020-07-14 DIAGNOSIS — O43123 Velamentous insertion of umbilical cord, third trimester: Secondary | ICD-10-CM | POA: Diagnosis present

## 2020-07-14 DIAGNOSIS — Z20822 Contact with and (suspected) exposure to covid-19: Secondary | ICD-10-CM | POA: Diagnosis present

## 2020-07-14 DIAGNOSIS — Z975 Presence of (intrauterine) contraceptive device: Secondary | ICD-10-CM

## 2020-07-14 DIAGNOSIS — O26899 Other specified pregnancy related conditions, unspecified trimester: Secondary | ICD-10-CM

## 2020-07-14 DIAGNOSIS — Z148 Genetic carrier of other disease: Secondary | ICD-10-CM

## 2020-07-14 DIAGNOSIS — M797 Fibromyalgia: Secondary | ICD-10-CM

## 2020-07-14 DIAGNOSIS — O163 Unspecified maternal hypertension, third trimester: Secondary | ICD-10-CM | POA: Diagnosis present

## 2020-07-14 DIAGNOSIS — O43199 Other malformation of placenta, unspecified trimester: Secondary | ICD-10-CM | POA: Diagnosis present

## 2020-07-14 DIAGNOSIS — Z3A40 40 weeks gestation of pregnancy: Secondary | ICD-10-CM

## 2020-07-14 DIAGNOSIS — Z87891 Personal history of nicotine dependence: Secondary | ICD-10-CM

## 2020-07-14 DIAGNOSIS — T839XXA Unspecified complication of genitourinary prosthetic device, implant and graft, initial encounter: Secondary | ICD-10-CM

## 2020-07-14 DIAGNOSIS — Z30430 Encounter for insertion of intrauterine contraceptive device: Secondary | ICD-10-CM

## 2020-07-14 DIAGNOSIS — O134 Gestational [pregnancy-induced] hypertension without significant proteinuria, complicating childbirth: Principal | ICD-10-CM | POA: Diagnosis present

## 2020-07-14 DIAGNOSIS — O9952 Diseases of the respiratory system complicating childbirth: Secondary | ICD-10-CM | POA: Diagnosis present

## 2020-07-14 DIAGNOSIS — J45909 Unspecified asthma, uncomplicated: Secondary | ICD-10-CM | POA: Diagnosis present

## 2020-07-14 DIAGNOSIS — Z6791 Unspecified blood type, Rh negative: Secondary | ICD-10-CM

## 2020-07-14 DIAGNOSIS — O139 Gestational [pregnancy-induced] hypertension without significant proteinuria, unspecified trimester: Secondary | ICD-10-CM | POA: Diagnosis present

## 2020-07-14 NOTE — H&P (Addendum)
OBSTETRIC ADMISSION HISTORY AND PHYSICAL  Eileen Abbott is a 30 y.o. female G1P0 with IUP at [redacted]w[redacted]d by 7 week [place] presenting for contractions. She presented to MAU for contractions beginning at 1000 yesterday (5/3). She reports ROM at 2300 with clear fluid.   She reports +FMs, no VB, no blurry vision, headaches or peripheral edema, and RUQ pain.  She plans on breast feeding. She request post placental liletta for birth control. 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 Asthma   . Attention deficit disorder   . Fibromyalgia     Past Surgical History: Past Surgical History:  Procedure Laterality Date  . NO PAST SURGERIES      Obstetrical History: OB History    Gravida  1   Para      Term      Preterm      AB      Living        SAB      IAB      Ectopic      Multiple      Live Births              Social History Social History   Socioeconomic History  . Marital status: Single    Spouse name: Not on file  . Number of children: Not on file  . Years of education: Not on file  . Highest education level: Not on file  Occupational History  . Not on file  Tobacco Use  . Smoking status: Former Smoker    Packs/day: 0.50    Years: 7.00    Pack years: 3.50    Types: Cigarettes  . Smokeless tobacco: Never Used  . Tobacco comment: stopped with pregnancy  Vaping Use  . Vaping Use: Never used  Substance and Sexual Activity  . Alcohol use: Not Currently    Comment: socially  . Drug use: Not Currently    Types: Marijuana    Comment: daily for fibromyalgia , stopped with pregnancy  . Sexual activity: Yes    Birth  control/protection: None  Other Topics Concern  . Not on file  Social History Narrative  . Not on file   Social Determinants of Health   Financial Resource Strain: Not on file  Food Insecurity: No Food Insecurity  . Worried About Charity fundraiser in the Last Year: Never true  . Ran Out of Food in the Last Year: Never true  Transportation Needs: No Transportation Needs  . Lack of Transportation (Medical): No  . Lack of Transportation (Non-Medical): No  Physical Activity: Not on file  Stress: Not on file  Social Connections: Not on file    Family History: Family History  Problem Relation Age of Onset  . Diabetes Mother   . Arthritis Mother   . Fibromyalgia Mother   . Irritable bowel syndrome Father   . Vitiligo Father     Allergies: Allergies  Allergen Reactions  . Banana Nausea And Vomiting  . Eggs Or Egg-Derived Products Nausea And Vomiting    Medications Prior to Admission  Medication Sig Dispense Refill Last Dose  . acetaminophen (TYLENOL) 500 MG  tablet Take 500 mg by mouth every 6 (six) hours as needed.   07/14/2020 at Unknown time  . albuterol (PROVENTIL) (2.5 MG/3ML) 0.083% nebulizer solution Take 3 mLs (2.5 mg total) by nebulization every 6 (six) hours as needed for wheezing or shortness of breath. 75 mL 0 Past Week at Unknown time  . folic acid (FOLVITE) 1 MG tablet Take 1 mg by mouth daily.   07/14/2020 at Unknown time  . Prenatal Vit-Fe Fumarate-FA (PRENATAL MULTIVITAMIN) TABS tablet Take 1 tablet by mouth daily at 12 noon.   07/14/2020 at Unknown time  . Blood Pressure Monitoring (BLOOD PRESSURE KIT) DEVI 1 Device by Does not apply route as needed. (Patient not taking: Reported on 07/10/2020) 1 each 0   . pantoprazole (PROTONIX) 40 MG tablet Take 1 tablet (40 mg total) by mouth daily. (Patient not taking: Reported on 07/10/2020) 30 tablet 0      Review of Systems   All systems reviewed and negative except as stated in HPI  Blood pressure 140/80, pulse 67,  temperature 97.7 F (36.5 C), temperature source Oral, resp. rate 18, height 5' (1.524 m), weight 69.9 kg, last menstrual period 10/01/2019, SpO2 100 %. General appearance: alert and no distress Lungs: normal WOB  Heart: regular rate Abdomen: soft, non-tender Extremities: no sign of DVT Presentation: cephalic by RN MAU exam Fetal monitoringBaseline: 120 bpm, Variability: Good {> 6 bpm), Accelerations: Reactive and Decelerations: Absent Uterine activityFrequency: Every 2-4 minutes and Intensity: strong Dilation: 2 Effacement (%): 90 Station: -2 Exam by:: Jodell Cipro RN   Prenatal labs: ABO, Rh: --/--/B NEG (05/03 2349) Antibody: NEG (05/03 2349) Rubella: 6.77 (10/25 1559) RPR: Non Reactive (02/03 0834)  HBsAg: Negative (10/25 1559)  HIV: Non Reactive (02/03 0834)  GBS: Negative/-- (04/07 1623)  2 hr Glucola 66/155/84 Genetic screening showed carrier of SMA Anatomy US normal with marginal cord insertion  Prenatal Transfer Tool  Maternal Diabetes: No Genetic Screening: Abnormal:  Results: Other: Genetic carrier of SMA  Maternal Ultrasounds/Referrals: Normal Fetal Ultrasounds or other Referrals:  None Maternal Substance Abuse:  No Significant Maternal Medications:  Meds include: Protonix and albuterol  Significant Maternal Lab Results: Group B Strep negative and Rh negative  Results for orders placed or performed during the hospital encounter of 07/14/20 (from the past 24 hour(s))  Type and screen [place]   Collection Time: 07/14/20 11:49 PM  Result Value Ref Range   ABO/RH(D) B NEG    Antibody Screen NEG    Sample Expiration      07/17/2020,2359 Performed at [facility], [address], [place], [place] [address]     Patient Active Problem List   Diagnosis Date Noted  . Elevated blood pressure affecting pregnancy in third trimester, antepartum 07/15/2020  . Marginal insertion of umbilical cord affecting management of mother 02/19/2020  .  Genetic carrier status 01/22/2020  . Supervision of low-risk pregnancy 12/23/2019  . Fibromyalgia   . Rh negative status during pregnancy 11/08/2019    Assessment/Plan:  Eileen Abbott is a 30 y.o. G1P0 at [redacted]w[redacted]d here for latent labor and is admitted in the setting of elevated BP.  #Labor: Latent. SROM @ 2300. Recheck in 2 hours. If unchanged at next check, will start pitocin.     #Pain: PRN, desires epidural #FWB: Cat I strip  #ID: GBS negative  #Rh Negative: Received at 30 weeks. Rhogam workup postpartum. #MOF: Breast #MOC: post placental liletta, ordered/consented, forms signed. #Circ:  No  #Marginal cord  insert: placenta to pathology #Elevated BP without diagnosis: BP on presentation to MAU 156/99, no history of HTN this pregnancy. Asymptomatic. PreE labs pending. Continue to monitor.  Jule Economy, Medical Student  07/15/2020, 1:21 AM  GME ATTESTATION:  I saw and evaluated the patient. I agree with the findings and the plan of care as documented in the medical student's note.  Arrie Senate, MD OB Fellow, [place] [facility] [place] 07/15/2020 1:27 AM

## 2020-07-14 NOTE — H&P (Incomplete)
OBSTETRIC ADMISSION HISTORY AND PHYSICAL  Eileen Abbott is a 30 y.o. female G1P0 with IUP at [redacted]w[redacted]d by 7 week [place] presenting for SOL. She presented to MAU for contractions beginning at 1000 today. On arrival, she had one mid-range pressure though asymptomatic so she she was admitted for observation and expectant management. Pre-eclampsia labs were ordered and are pending at this time. While in MAU, she had SROM at 2300.   She reports +FMs, no VB, no blurry vision, headaches or peripheral edema, and RUQ pain.  She plans on breast feeding. She request PP-IUD for birth control. 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 Asthma   . Attention deficit disorder   . Fibromyalgia     Past Surgical History: Past Surgical History:  Procedure Laterality Date  . NO PAST SURGERIES      Obstetrical History: OB History    Gravida  1   Para      Term      Preterm      AB      Living        SAB      IAB      Ectopic      Multiple      Live Births              Social History Social History   Socioeconomic History  . Marital status: Single    Spouse name: Not on file  . Number of children: Not on file  . Years of education: Not on file  . Highest education level: Not on file  Occupational History  . Not on file  Tobacco Use  . Smoking status: Former Smoker    Packs/day: 0.50    Years: 7.00    Pack years: 3.50    Types: Cigarettes  . Smokeless tobacco: Never Used  . Tobacco comment: stopped with pregnancy  Vaping Use  . Vaping Use: Never used  Substance and Sexual Activity  . Alcohol use: Not Currently    Comment: socially  . Drug  use: Not Currently    Types: Marijuana    Comment: daily for fibromyalgia , stopped with pregnancy  . Sexual activity: Yes    Birth control/protection: None  Other Topics Concern  . Not on file  Social History Narrative  . Not on file   Social Determinants of Health   Financial Resource Strain: Not on file  Food Insecurity: No Food Insecurity  . Worried About Charity fundraiser in the Last Year: Never true  . Ran Out of Food in the Last Year: Never true  Transportation Needs: No Transportation Needs  . Lack of Transportation (Medical): No  . Lack of Transportation (Non-Medical): No  Physical Activity: Not on file  Stress: Not on file  Social Connections: Not on file    Family History: Family History  Problem Relation Age of Onset  . Diabetes Mother   . Arthritis Mother   . Fibromyalgia Mother   . Irritable bowel syndrome Father   . Vitiligo Father     Allergies: Allergies  Allergen Reactions  . Banana Nausea And Vomiting  . Eggs  Or Egg-Derived Products Nausea And Vomiting    Medications Prior to Admission  Medication Sig Dispense Refill Last Dose  . acetaminophen (TYLENOL) 500 MG tablet Take 500 mg by mouth every 6 (six) hours as needed.   07/14/2020 at Unknown time  . albuterol (PROVENTIL) (2.5 MG/3ML) 0.083% nebulizer solution Take 3 mLs (2.5 mg total) by nebulization every 6 (six) hours as needed for wheezing or shortness of breath. 75 mL 0 Past Week at Unknown time  . folic acid (FOLVITE) 1 MG tablet Take 1 mg by mouth daily.   07/14/2020 at Unknown time  . Prenatal Vit-Fe Fumarate-FA (PRENATAL MULTIVITAMIN) TABS tablet Take 1 tablet by mouth daily at 12 noon.   07/14/2020 at Unknown time  . Blood Pressure Monitoring (BLOOD PRESSURE KIT) DEVI 1 Device by Does not apply route as needed. (Patient not taking: Reported on 07/10/2020) 1 each 0   . pantoprazole (PROTONIX) 40 MG tablet Take 1 tablet (40 mg total) by mouth daily. (Patient not taking: Reported on 07/10/2020) 30  tablet 0      Review of Systems   All systems reviewed and negative except as stated in HPI  Blood pressure (!) 157/105, pulse 87, temperature 98.6 F (37 C), resp. rate 18, height 5' (1.524 m), weight 69.9 kg, last menstrual period 10/01/2019, SpO2 100 %. General appearance: {general exam:16600} Lungs: clear to auscultation bilaterally Heart: regular rate and rhythm Abdomen: soft, non-tender; bowel sounds normal Pelvic: *** Extremities: Homans sign is negative, no sign of DVT DTR's *** Presentation: {desc; fetal presentation:14558} Fetal monitoring{findings; monitor fetal heart monitor:31527} Uterine activity{Uterine contractions:31516} Dilation: 2 Effacement (%): 90 Station: -2 Exam by:: Jodell Cipro RN   Prenatal labs: ABO, Rh: B/Negative/-- (10/25 1559) Antibody: Negative (10/25 1559) Rubella: 6.77 (10/25 1559) RPR: Non Reactive (02/03 0834)  HBsAg: Negative (10/25 1559)  HIV: Non Reactive (02/03 0834)  GBS: Negative/-- (04/07 1623)  1 hr Glucola *** Genetic screening  *** Anatomy US normal with marginal cord insertion  Prenatal Transfer Tool  Maternal Diabetes: No Genetic Screening: Abnormal:  Results: Other: Genetic carrier of SMA  Maternal Ultrasounds/Referrals: Normal Fetal Ultrasounds or other Referrals:  Referred to Materal Fetal Medicine for marginal insertion of umbilical cord and Genetic carrier of SMA  Maternal Substance Abuse:  {Maternal Substance Abuse:20223} Significant Maternal Medications:  Meds include: Protonix and albuterol  Significant Maternal Lab Results: Group B Strep negative and Rh negative  No results found for this or any previous visit (from the past 24 hour(s)).  Patient Active Problem List   Diagnosis Date Noted  . Marginal insertion of umbilical cord affecting management of mother 02/19/2020  . Genetic carrier status 01/22/2020  . Supervision of low-risk pregnancy 12/23/2019  . Fibromyalgia   . Rh negative status during pregnancy  11/08/2019    Assessment/Plan:  Eileen Abbott is a 30 y.o. G1P0 at [redacted]w[redacted]d here for start of labor in the setting of elevated blood pressures.  #Labor: latent #Pain: PRN #FWB: *** #ID:  GBS negative  #Rh Negative: Rhogam PP #MOF: breast #MOC: PP IUD  #Circ:  No   Jule Economy, Medical Student  07/14/2020, 11:48 PM  Dalene Carrow, MS3

## 2020-07-14 NOTE — Telephone Encounter (Signed)
Preadmission screen  

## 2020-07-14 NOTE — MAU Note (Signed)
Pt reports contractions since 1000, ROM at 2300, reports good fetal movement.

## 2020-07-15 ENCOUNTER — Encounter (HOSPITAL_COMMUNITY): Payer: Self-pay | Admitting: Obstetrics and Gynecology

## 2020-07-15 ENCOUNTER — Other Ambulatory Visit: Payer: Self-pay

## 2020-07-15 DIAGNOSIS — O134 Gestational [pregnancy-induced] hypertension without significant proteinuria, complicating childbirth: Secondary | ICD-10-CM | POA: Diagnosis not present

## 2020-07-15 DIAGNOSIS — Z3A40 40 weeks gestation of pregnancy: Secondary | ICD-10-CM | POA: Diagnosis not present

## 2020-07-15 DIAGNOSIS — O26893 Other specified pregnancy related conditions, third trimester: Secondary | ICD-10-CM | POA: Diagnosis not present

## 2020-07-15 DIAGNOSIS — O43123 Velamentous insertion of umbilical cord, third trimester: Secondary | ICD-10-CM | POA: Diagnosis not present

## 2020-07-15 DIAGNOSIS — O360130 Maternal care for anti-D [Rh] antibodies, third trimester, not applicable or unspecified: Secondary | ICD-10-CM | POA: Diagnosis not present

## 2020-07-15 DIAGNOSIS — T839XXA Unspecified complication of genitourinary prosthetic device, implant and graft, initial encounter: Secondary | ICD-10-CM

## 2020-07-15 DIAGNOSIS — O99892 Other specified diseases and conditions complicating childbirth: Secondary | ICD-10-CM | POA: Diagnosis not present

## 2020-07-15 DIAGNOSIS — Z20822 Contact with and (suspected) exposure to covid-19: Secondary | ICD-10-CM | POA: Diagnosis not present

## 2020-07-15 DIAGNOSIS — O163 Unspecified maternal hypertension, third trimester: Secondary | ICD-10-CM | POA: Diagnosis present

## 2020-07-15 DIAGNOSIS — Z30430 Encounter for insertion of intrauterine contraceptive device: Secondary | ICD-10-CM | POA: Diagnosis not present

## 2020-07-15 DIAGNOSIS — O480 Post-term pregnancy: Secondary | ICD-10-CM | POA: Diagnosis not present

## 2020-07-15 DIAGNOSIS — O43199 Other malformation of placenta, unspecified trimester: Secondary | ICD-10-CM | POA: Diagnosis not present

## 2020-07-15 DIAGNOSIS — O139 Gestational [pregnancy-induced] hypertension without significant proteinuria, unspecified trimester: Secondary | ICD-10-CM

## 2020-07-15 DIAGNOSIS — M797 Fibromyalgia: Secondary | ICD-10-CM | POA: Diagnosis not present

## 2020-07-15 DIAGNOSIS — Z6791 Unspecified blood type, Rh negative: Secondary | ICD-10-CM | POA: Diagnosis not present

## 2020-07-15 DIAGNOSIS — Z87891 Personal history of nicotine dependence: Secondary | ICD-10-CM | POA: Diagnosis not present

## 2020-07-15 DIAGNOSIS — R030 Elevated blood-pressure reading, without diagnosis of hypertension: Secondary | ICD-10-CM | POA: Diagnosis not present

## 2020-07-15 DIAGNOSIS — Z975 Presence of (intrauterine) contraceptive device: Secondary | ICD-10-CM

## 2020-07-15 DIAGNOSIS — J45909 Unspecified asthma, uncomplicated: Secondary | ICD-10-CM | POA: Diagnosis not present

## 2020-07-15 DIAGNOSIS — O9952 Diseases of the respiratory system complicating childbirth: Secondary | ICD-10-CM | POA: Diagnosis not present

## 2020-07-15 HISTORY — DX: Gestational (pregnancy-induced) hypertension without significant proteinuria, unspecified trimester: O13.9

## 2020-07-15 LAB — PROTEIN / CREATININE RATIO, URINE
Creatinine, Urine: 214.65 mg/dL
Protein Creatinine Ratio: 0.09 mg/mg{Cre} (ref 0.00–0.15)
Total Protein, Urine: 19 mg/dL

## 2020-07-15 LAB — COMPREHENSIVE METABOLIC PANEL
ALT: 69 U/L — ABNORMAL HIGH (ref 0–44)
AST: 59 U/L — ABNORMAL HIGH (ref 15–41)
Albumin: 2.5 g/dL — ABNORMAL LOW (ref 3.5–5.0)
Alkaline Phosphatase: 132 U/L — ABNORMAL HIGH (ref 38–126)
Anion gap: 9 (ref 5–15)
BUN: 11 mg/dL (ref 6–20)
CO2: 22 mmol/L (ref 22–32)
Calcium: 8.5 mg/dL — ABNORMAL LOW (ref 8.9–10.3)
Chloride: 106 mmol/L (ref 98–111)
Creatinine, Ser: 0.76 mg/dL (ref 0.44–1.00)
GFR, Estimated: >60 mL/min (ref >60)
Glucose, Bld: 77 mg/dL (ref 70–99)
Potassium: 3.5 mmol/L (ref 3.5–5.1)
Sodium: 137 mmol/L (ref 135–145)
Total Bilirubin: 0.4 mg/dL (ref 0.3–1.2)
Total Protein: 5.7 g/dL — ABNORMAL LOW (ref 6.5–8.1)

## 2020-07-15 LAB — CBC
HCT: 35.3 % — ABNORMAL LOW (ref 36.0–46.0)
Hemoglobin: 12.1 g/dL (ref 12.0–15.0)
MCH: 31.4 pg (ref 26.0–34.0)
MCHC: 34.3 g/dL (ref 30.0–36.0)
MCV: 91.7 fL (ref 80.0–100.0)
Platelets: 196 10*3/uL (ref 150–400)
RBC: 3.85 MIL/uL — ABNORMAL LOW (ref 3.87–5.11)
RDW: 13.2 % (ref 11.5–15.5)
WBC: 10.5 10*3/uL (ref 4.0–10.5)
nRBC: 0 % (ref 0.0–0.2)

## 2020-07-15 LAB — RESP PANEL BY RT-PCR (FLU A&B, COVID) ARPGX2
Influenza A by PCR: NEGATIVE
Influenza B by PCR: NEGATIVE
SARS Coronavirus 2 by RT PCR: NEGATIVE

## 2020-07-15 LAB — TYPE AND SCREEN
ABO/RH(D): B NEG
Antibody Screen: NEGATIVE

## 2020-07-15 LAB — RPR: RPR Ser Ql: NONREACTIVE

## 2020-07-15 MED ORDER — MEASLES, MUMPS & RUBELLA VAC IJ SOLR: 0.5000 mL | Freq: Once | INTRAMUSCULAR | Status: DC

## 2020-07-15 MED ORDER — FENTANYL CITRATE (PF) 100 MCG/2ML IJ SOLN
50.0000 ug | INTRAMUSCULAR | Status: DC | PRN
  Administered 2020-07-15: 50 ug via INTRAVENOUS
  Administered 2020-07-15: 100 ug via INTRAVENOUS
  Filled 2020-07-15 (×2): qty 2

## 2020-07-15 MED ORDER — OXYTOCIN-SODIUM CHLORIDE 30-0.9 UT/500ML-% IV SOLN
2.5000 [IU]/h | INTRAVENOUS | Status: DC
  Administered 2020-07-15: 2.5 [IU]/h via INTRAVENOUS
  Filled 2020-07-15: qty 500

## 2020-07-15 MED ORDER — OXYCODONE-ACETAMINOPHEN 5-325 MG PO TABS: 2.0000 | ORAL_TABLET | ORAL | Status: DC | PRN

## 2020-07-15 MED ORDER — OXYTOCIN BOLUS FROM INFUSION
333.0000 mL | Freq: Once | INTRAVENOUS | Status: AC
  Administered 2020-07-15: 333 mL via INTRAVENOUS

## 2020-07-15 MED ORDER — TETANUS-DIPHTH-ACELL PERTUSSIS 5-2.5-18.5 LF-MCG/0.5 IM SUSY: 0.5000 mL | PREFILLED_SYRINGE | Freq: Once | INTRAMUSCULAR | Status: DC

## 2020-07-15 MED ORDER — PRENATAL MULTIVITAMIN CH
1.0000 | ORAL_TABLET | Freq: Every day | ORAL | Status: DC
  Administered 2020-07-15 – 2020-07-16 (×2): 1 via ORAL
  Filled 2020-07-15 (×2): qty 1

## 2020-07-15 MED ORDER — ACETAMINOPHEN 325 MG PO TABS: 650.0000 mg | ORAL_TABLET | ORAL | Status: DC | PRN

## 2020-07-15 MED ORDER — ONDANSETRON HCL 4 MG/2ML IJ SOLN
4.0000 mg | INTRAMUSCULAR | Status: DC | PRN
Start: 2020-07-15 — End: 2020-07-16

## 2020-07-15 MED ORDER — OXYCODONE-ACETAMINOPHEN 5-325 MG PO TABS
1.0000 | ORAL_TABLET | ORAL | Status: DC | PRN
Start: 2020-07-15 — End: 2020-07-15

## 2020-07-15 MED ORDER — ONDANSETRON HCL 4 MG PO TABS: 4.0000 mg | ORAL_TABLET | ORAL | Status: DC | PRN

## 2020-07-15 MED ORDER — ONDANSETRON HCL 4 MG/2ML IJ SOLN: 4.0000 mg | Freq: Four times a day (QID) | INTRAMUSCULAR | Status: DC | PRN

## 2020-07-15 MED ORDER — LIDOCAINE HCL (PF) 1 % IJ SOLN: 30.0000 mL | INTRAMUSCULAR | Status: DC | PRN

## 2020-07-15 MED ORDER — LACTATED RINGERS IV SOLN: INTRAVENOUS | Status: DC

## 2020-07-15 MED ORDER — LEVONORGESTREL 20.1 MCG/DAY IU IUD
1.0000 | INTRAUTERINE_SYSTEM | Freq: Once | INTRAUTERINE | Status: AC
  Administered 2020-07-15: 1 via INTRAUTERINE
  Filled 2020-07-15: qty 1

## 2020-07-15 MED ORDER — TRANEXAMIC ACID-NACL 1000-0.7 MG/100ML-% IV SOLN
INTRAVENOUS | Status: AC
  Administered 2020-07-15: 1000 mg
  Filled 2020-07-15: qty 100

## 2020-07-15 MED ORDER — IBUPROFEN 600 MG PO TABS
600.0000 mg | ORAL_TABLET | Freq: Four times a day (QID) | ORAL | Status: DC
  Administered 2020-07-15 – 2020-07-16 (×6): 600 mg via ORAL
  Filled 2020-07-15 (×6): qty 1

## 2020-07-15 MED ORDER — DIBUCAINE (PERIANAL) 1 % EX OINT: 1.0000 "application " | TOPICAL_OINTMENT | CUTANEOUS | Status: DC | PRN

## 2020-07-15 MED ORDER — SIMETHICONE 80 MG PO CHEW: 80.0000 mg | CHEWABLE_TABLET | ORAL | Status: DC | PRN

## 2020-07-15 MED ORDER — SOD CITRATE-CITRIC ACID 500-334 MG/5ML PO SOLN: 30.0000 mL | ORAL | Status: DC | PRN

## 2020-07-15 MED ORDER — WITCH HAZEL-GLYCERIN EX PADS: 1.0000 "application " | MEDICATED_PAD | CUTANEOUS | Status: DC | PRN

## 2020-07-15 MED ORDER — BENZOCAINE-MENTHOL 20-0.5 % EX AERO
1.0000 "application " | INHALATION_SPRAY | CUTANEOUS | Status: DC | PRN
  Administered 2020-07-15: 1 via TOPICAL
  Filled 2020-07-15: qty 56

## 2020-07-15 MED ORDER — DIPHENHYDRAMINE HCL 25 MG PO CAPS: 25.0000 mg | ORAL_CAPSULE | Freq: Four times a day (QID) | ORAL | Status: DC | PRN

## 2020-07-15 MED ORDER — ACETAMINOPHEN 325 MG PO TABS
650.0000 mg | ORAL_TABLET | ORAL | Status: DC | PRN
  Administered 2020-07-15 (×2): 650 mg via ORAL
  Filled 2020-07-15 (×2): qty 2

## 2020-07-15 MED ORDER — SENNOSIDES-DOCUSATE SODIUM 8.6-50 MG PO TABS
2.0000 | ORAL_TABLET | ORAL | Status: DC
  Administered 2020-07-15 – 2020-07-16 (×2): 2 via ORAL
  Filled 2020-07-15 (×2): qty 2

## 2020-07-15 MED ORDER — LACTATED RINGERS IV SOLN: 500.0000 mL | INTRAVENOUS | Status: DC | PRN

## 2020-07-15 MED ORDER — TRANEXAMIC ACID-NACL 1000-0.7 MG/100ML-% IV SOLN: 1000.0000 mg | INTRAVENOUS | Status: DC

## 2020-07-15 MED ORDER — COCONUT OIL OIL
1.0000 "application " | TOPICAL_OIL | Status: DC | PRN
  Administered 2020-07-15: 1 via TOPICAL

## 2020-07-15 NOTE — Lactation Note (Signed)
This note was copied from a baby's chart. Lactation Consultation Note  Patient Name: Eileen Abbott [id number] Date: 07/15/2020 Reason for consult: Primapara;1st time breastfeeding;Term;Other (Comment) (4 hours old , P1, Mom , dad and baby sound asleep. Baby has eaten once since birth 13 mins . LC will F/U with Dyad.) - Latch score 8 with swallows.  Age:31 hours  Maternal Data    Feeding Mother's Current Feeding Choice: Breast Milk  LATCH Score                    Lactation Tools Discussed/Used    Interventions    Discharge    Consult Status Consult Status: Follow-up Date: 07/15/20 Follow-up type: In-patient    Matilde Sprang Yesli Vanderhoff 07/15/2020, 8:36 AM

## 2020-07-15 NOTE — Discharge Instructions (Signed)

## 2020-07-15 NOTE — Lactation Note (Signed)
This note was copied from a baby's chart. Lactation Consultation Note  Patient Name: Eileen Abbott Today's Date: 07/15/2020 Reason for consult: Initial assessment;Primapara;1st time breastfeeding;Term Age:31 hours/  LC 2nd visit this am, baby wide awake and rooting and gently called moms name to wake her up to feed her baby.  Per mom had attempted to latch at 9 am .  LC offered to check diaper and assist with latch. Diaper dry, and LC Placed baby STS on the right breast, mom able to hand express on her own with little instructions and excellent flow. LC worked with mom to tape the baby's upper lip to open wide before bringing baby into the breast and baby latched easily and fed 17 mins with swallows - Latch score 9, released on his own and nipple well rounded. Per mom comfortable.  Baby still rooting and LC assisted to re-latch on the left breast / cross cradle / depth achieved and baby fed for 10 mins/ released and nipple slightly slanted.  Mom has some areola edema / areola compressible/ and LC showed mom the reverse pressure exercise in aid the baby to work the jaw more with feeding .  Per mom comfortable with both feedings.   Maternal Data Has patient been taught Hand Expression?: Yes (mom able to independently hand express, per mom watched a video) Does the patient have breastfeeding experience prior to this delivery?: No  Feeding Mother's Current Feeding Choice: Breast Milk  LATCH Score Latch: Grasps breast easily, tongue down, lips flanged, rhythmical sucking.  Audible Swallowing: Spontaneous and intermittent  Type of Nipple: Everted at rest and after stimulation  Comfort (Breast/Nipple): Soft / non-tender  Hold (Positioning): Assistance needed to correctly position infant at breast and maintain latch.  LATCH Score: 9   Lactation Tools Discussed/Used    Interventions Interventions: Breast feeding basics reviewed;Assisted with latch;Skin to skin;Breast massage;Hand  express;Breast compression;Adjust position;Support pillows;Position options;Education  Discharge Pump: Personal WIC Program: Yes  Consult Status Consult Status: Follow-up Date: 07/15/20 Follow-up type: In-patient    Matilde Sprang Eileen Abbott 07/15/2020, 9:55 AM

## 2020-07-15 NOTE — Procedures (Signed)
  Post-Placental IUD Insertion Procedure Note  Patient identified, informed consent signed prior to delivery, signed copy in chart, time out was performed.    Vaginal, labial and perineal areas thoroughly inspected for lacerations. Left labial laceration identified - hemostatic, not repaired prior to insertion of IUD.    Liletta  - IUD inserted with inserter per manufacturer's instructions.    Strings trimmed to the level of the introitus. Patient tolerated procedure well.  Patient given post procedure instructions and IUD care card with expiration date.  Patient is asked to keep IUD strings tucked in her vagina until her postpartum follow up visit in 4-6 weeks. Patient advised to abstain from sexual intercourse and pulling on strings before her follow-up visit. Patient verbalized an understanding of the plan of care and agrees.   Alric Seton, MD OB Fellow, Faculty [facility] 07/15/2020 4:17 AM

## 2020-07-15 NOTE — Discharge Summary (Signed)
Postpartum Discharge Summary      Patient Name: Eileen Abbott DOB: [birth year] MRN: [id number]  Date of admission: 07/14/2020 Delivery date:07/15/2020  Delivering provider: Arrie Senate  Date of discharge: 07/16/2020  Admitting diagnosis: Elevated blood pressure affecting pregnancy in third trimester, antepartum [O16.3] Intrauterine pregnancy: [redacted]w[redacted]d     Secondary diagnosis:  Active Problems:   Rh negative status during pregnancy   Genetic carrier status   Marginal insertion of umbilical cord affecting management of mother   Gestational hypertension   Vaginal delivery   IUD (intrauterine device) in place  Additional problems: none    Discharge diagnosis: Term Pregnancy Delivered and Gestational Hypertension                                              Post partum procedures:post placental liletta placed Augmentation: N/A Complications: None  Hospital course: Onset of Labor With Vaginal Delivery      30 y.o. yo G1P0 at [redacted]w[redacted]d was admitted in Latent Labor on 07/14/2020. Patient had an uncomplicated labor course as follows:  Membrane Rupture Time/Date: 10:30 PM ,07/14/2020   Delivery Method:Vaginal, Spontaneous  Episiotomy: None  Lacerations:  Labial  Patient had a postpartum course remarkable for having Procardia XL 30 started on PPD#1 due to mild range elevations. Repeat CMET on PPD#1 shows liver enzymes trending back down with nl AST. She is ambulating, tolerating a regular diet, passing flatus, and urinating well. Patient is discharged home in stable condition on 07/16/20.  Newborn Data: Birth date:07/15/2020  Birth time:3:46 AM  Gender:Female  Living status:Living  Apgars:9 ,9  Weight:2900 g (6lb 6.3oz)  Magnesium Sulfate received: No BMZ received: No Rhophylac:No- baby is AB neg MMR:N/A T-DaP:Given prenatally Flu: No Transfusion:No  Physical exam  Vitals:   07/15/20 1450 07/15/20 1820 07/15/20 2030 07/16/20 0533  BP: 124/89 140/89 (!) 141/85 119/88  Pulse: 95  89 97 75  Resp: $Remo'18 16 17 16  'FtGbT$ Temp: 97.8 F (36.6 C) 97.9 F (36.6 C) 98 F (36.7 C) 98.2 F (36.8 C)  TempSrc: Oral Oral Oral Oral  SpO2: 100% 97% 100% 98%  Weight:      Height:       General: alert and cooperative Lochia: appropriate Uterine Fundus: firm Incision: N/A DVT Evaluation: No evidence of DVT seen on physical exam. Labs: Lab Results  Component Value Date   WBC 10.5 07/15/2020   HGB 12.1 07/15/2020   HCT 35.3 (L) 07/15/2020   MCV 91.7 07/15/2020   PLT 196 07/15/2020   CMP Latest Ref Rng & Units 07/16/2020  Glucose 70 - 99 mg/dL 76  BUN 6 - 20 mg/dL 10  Creatinine 0.44 - 1.00 mg/dL 0.54  Sodium 135 - 145 mmol/L 137  Potassium 3.5 - 5.1 mmol/L 3.8  Chloride 98 - 111 mmol/L 106  CO2 22 - 32 mmol/L 25  Calcium 8.9 - 10.3 mg/dL 8.2(L)  Total Protein 6.5 - 8.1 g/dL 4.9(L)  Total Bilirubin 0.3 - 1.2 mg/dL 0.5  Alkaline Phos 38 - 126 U/L 102  AST 15 - 41 U/L 32  ALT 0 - 44 U/L 48(H)   Flavia Shipper Score: Edinburgh Postnatal Depression Scale Screening Tool 07/15/2020  I have been able to laugh and see the funny side of things. 0  I have looked forward with enjoyment to things. 0  I have blamed myself unnecessarily when things  went wrong. 2  I have been anxious or worried for no good reason. 1  I have felt scared or panicky for no good reason. 1  Things have been getting on top of me. 2  I have been so unhappy that I have had difficulty sleeping. 0  I have felt sad or miserable. 1  I have been so unhappy that I have been crying. 1  The thought of harming myself has occurred to me. 0  Edinburgh Postnatal Depression Scale Total 8     After visit meds:  Allergies as of 07/16/2020      Reactions   Banana Nausea And Vomiting   Eggs Or Egg-derived Products Nausea And Vomiting      Medication List    STOP taking these medications   acetaminophen 500 MG tablet Commonly known as: TYLENOL   Blood Pressure Kit Devi   folic acid 1 MG tablet Commonly known as:  FOLVITE   pantoprazole 40 MG tablet Commonly known as: Protonix     TAKE these medications   albuterol (2.5 MG/3ML) 0.083% nebulizer solution Commonly known as: PROVENTIL Take 3 mLs (2.5 mg total) by nebulization every 6 (six) hours as needed for wheezing or shortness of breath.   ibuprofen 600 MG tablet Commonly known as: ADVIL Take 1 tablet (600 mg total) by mouth every 6 (six) hours as needed.   NIFEdipine 30 MG 24 hr tablet Commonly known as: ADALAT CC Take 1 tablet (30 mg total) by mouth daily.   prenatal multivitamin Tabs tablet Take 1 tablet by mouth daily at 12 noon.        Discharge home in stable condition Infant Feeding: Breast Infant Disposition:home with mother Discharge instruction: per After Visit Summary and Postpartum booklet. Activity: Advance as tolerated. Pelvic rest for 6 weeks.  Diet: routine diet Future Appointments: Future Appointments  Date Time Provider [place]  07/20/2020  9:40 AM [facility]-SCREENING [facility] None  07/22/2020  9:20 AM [facility] NURSE [facility]  08/20/2020  2:55 PM Griffin Basil, MD [facility]   Follow up Visit: Message sent to [facility] 07/15/20 by Sylvester Harder.   Please schedule this patient for a In person postpartum visit in 6 weeks with the following provider: Any provider. Additional Postpartum F/U:BP check 1 week (elevated BP on presentation to MAU, no dx) Low risk pregnancy complicated by: n/a Delivery mode:  Vaginal, Spontaneous  Anticipated Birth Control:  PP IUD placed   07/16/2020 Myrtis Ser, CNM  8:36 AM

## 2020-07-16 ENCOUNTER — Other Ambulatory Visit (HOSPITAL_COMMUNITY): Payer: Self-pay

## 2020-07-16 LAB — COMPREHENSIVE METABOLIC PANEL
ALT: 48 U/L — ABNORMAL HIGH (ref 0–44)
AST: 32 U/L (ref 15–41)
Albumin: 2.2 g/dL — ABNORMAL LOW (ref 3.5–5.0)
Alkaline Phosphatase: 102 U/L (ref 38–126)
Anion gap: 6 (ref 5–15)
BUN: 10 mg/dL (ref 6–20)
CO2: 25 mmol/L (ref 22–32)
Calcium: 8.2 mg/dL — ABNORMAL LOW (ref 8.9–10.3)
Chloride: 106 mmol/L (ref 98–111)
Creatinine, Ser: 0.54 mg/dL (ref 0.44–1.00)
GFR, Estimated: >60 mL/min (ref >60)
Glucose, Bld: 76 mg/dL (ref 70–99)
Potassium: 3.8 mmol/L (ref 3.5–5.1)
Sodium: 137 mmol/L (ref 135–145)
Total Bilirubin: 0.5 mg/dL (ref 0.3–1.2)
Total Protein: 4.9 g/dL — ABNORMAL LOW (ref 6.5–8.1)

## 2020-07-16 MED ORDER — NIFEDIPINE ER 30 MG PO TB24
30.0000 mg | ORAL_TABLET | Freq: Every day | ORAL | 3 refills | Status: DC
  Filled 2020-07-16: qty 30, 30d supply, fill #0

## 2020-07-16 MED ORDER — IBUPROFEN 600 MG PO TABS
600.0000 mg | ORAL_TABLET | Freq: Four times a day (QID) | ORAL | 0 refills | Status: DC | PRN
  Filled 2020-07-16: qty 30, 8d supply, fill #0

## 2020-07-16 MED ORDER — NIFEDIPINE ER OSMOTIC RELEASE 30 MG PO TB24
30.0000 mg | ORAL_TABLET | Freq: Every day | ORAL | Status: DC
  Administered 2020-07-16: 30 mg via ORAL
  Filled 2020-07-16: qty 1

## 2020-07-16 NOTE — Lactation Note (Signed)
This note was copied from a baby's chart. Lactation Consultation Note  Patient Name: Eileen Abbott KMMNO'T Date: 07/16/2020 Reason for consult: Follow-up assessment Age:32 hours  Follow up with 31 hours old infant with 4.14% weight loss at the time of this visit. Mother reports breastfeeding is going well. Mother states infant just finished breastfeeding, ~45 minutes right breast. LC noted infant swaddled and still cueing. Talked about hunger cues and offer to assist with latch.  Infant latches in modified cradle position to left breast. Noted suckling, audible swallowing, breast tissue movement. Infant popped off breast and observed slanted nipple. Demonstrated nipple to nose after hand expression, deep asymmetrical latch, proper alignment and neck/back support.  Reviewed milk coming into volume signs as fullness, firmness. Discussed normal I&O's, skin to skin benefits and normal newborn behavior/cluster-feeding.  Discharge Plan: 1. Breastfeed following hunger cues or 8 - 12 times in 24h period to establish good milk supply.   2. Ensure infant has a deep latch and/or chin tugging to improve latch. 3. Pump or hand-express and offer EBM as needed. 4. Monitor voids and stools 5. Encouraged maternal rest, hydration and food intake.  6. Contact Lactation Services or local resources for support, questions or concerns.   Provided Lactation Services brochure, promoted [facility] booklet and encouraged to call for support. All questions answered at this time. Family is ready to go home.  Infant is still breastfeeding (~52minutes)  Maternal Data Has patient been taught Hand Expression?: Yes Does the patient have breastfeeding experience prior to this delivery?: No  Feeding Mother's Current Feeding Choice: Breast Milk  LATCH Score Latch: Grasps breast easily, tongue down, lips flanged, rhythmical sucking.  Audible Swallowing: Spontaneous and intermittent  Type of Nipple: Everted at rest and  after stimulation  Comfort (Breast/Nipple): Soft / non-tender  Hold (Positioning): Assistance needed to correctly position infant at breast and maintain latch.  LATCH Score: 9  Interventions Interventions: Breast feeding basics reviewed;Assisted with latch;Skin to skin;Breast massage;Hand express;Breast compression;Adjust position;Support pillows;Position options;Expressed milk;Education  Discharge Discharge Education: Engorgement and breast care;Warning signs for feeding baby  Consult Status Consult Status: Complete Date: 07/16/20 Follow-up type: Call as needed    Deandrew Hoecker A Higuera Ancidey 07/16/2020, 11:33 AM

## 2020-07-17 ENCOUNTER — Encounter: Payer: Medicaid Other | Admitting: Medical

## 2020-07-17 ENCOUNTER — Telehealth: Payer: Self-pay

## 2020-07-17 LAB — SURGICAL PATHOLOGY

## 2020-07-17 NOTE — Telephone Encounter (Signed)
Transition Care Management Unsuccessful Follow-up Telephone Call  Date of discharge and from where:  07/16/2020 from [facility]  Attempts:  1st Attempt  Reason for unsuccessful TCM follow-up call:  Left voice message

## 2020-07-20 ENCOUNTER — Other Ambulatory Visit (HOSPITAL_COMMUNITY): Admission: RE | Admit: 2020-07-20 | Payer: Medicaid Other | Source: Ambulatory Visit

## 2020-07-20 NOTE — Telephone Encounter (Signed)
Transition Care Management Unsuccessful Follow-up Telephone Call  Date of discharge and from where:  07/16/2020 from [facility]  Attempts:  2nd Attempt  Reason for unsuccessful TCM follow-up call:  Left voice message

## 2020-07-21 ENCOUNTER — Inpatient Hospital Stay (HOSPITAL_COMMUNITY): Payer: Medicaid Other

## 2020-07-21 ENCOUNTER — Inpatient Hospital Stay (HOSPITAL_COMMUNITY)
Admission: AD | Admit: 2020-07-21 | Payer: Medicaid Other | Source: Home / Self Care | Admitting: Obstetrics & Gynecology

## 2020-07-21 NOTE — Telephone Encounter (Signed)
Transition Care Management Unsuccessful Follow-up Telephone Call  Date of discharge and from where:  07/16/2020 from [facility]  Attempts:  3rd Attempt  Reason for unsuccessful TCM follow-up call:  Unable to reach patient

## 2020-07-22 ENCOUNTER — Other Ambulatory Visit: Payer: Self-pay

## 2020-07-22 ENCOUNTER — Ambulatory Visit (INDEPENDENT_AMBULATORY_CARE_PROVIDER_SITE_OTHER): Payer: Medicaid Other | Admitting: Pharmacist

## 2020-07-22 VITALS — BP 122/96 | HR 101

## 2020-07-22 DIAGNOSIS — Z0130 Encounter for examination of blood pressure without abnormal findings: Secondary | ICD-10-CM

## 2020-07-22 NOTE — Progress Notes (Signed)
Patient presenting today for postpartum blood pressure check. She endorse a 1/2 dollar sized clot as well as a nose bleed that resolved spontaneously after 5 minutes in the past week. Assured patient this is normal but if she passes clots larger than her fist to call office. She also mentions she has not been taking her nifedipine as it caused her significant headaches and dizziness (about to pass out) that resolved once stopping medication. Blood pressure today is normal at 122/96. She mentions BPs at home have been in 120s/80s but has not been documenting in babyscripts as her phone has died.  Given BP at goal despite not taking medication, will have patient stop Procardia XL and follow-up at 6 weeks postpartum. Patient reminded of date/time of the appointment and is agreeable to plan. Spoke with Dr. Debroah Loop who agrees with plan.

## 2020-08-20 ENCOUNTER — Ambulatory Visit: Payer: Self-pay | Admitting: Obstetrics and Gynecology

## 2020-08-25 ENCOUNTER — Ambulatory Visit (INDEPENDENT_AMBULATORY_CARE_PROVIDER_SITE_OTHER): Payer: Medicaid Other

## 2020-08-25 ENCOUNTER — Other Ambulatory Visit: Payer: Self-pay

## 2020-08-25 ENCOUNTER — Other Ambulatory Visit (HOSPITAL_COMMUNITY)
Admission: RE | Admit: 2020-08-25 | Discharge: 2020-08-25 | Disposition: A | Discharge status: Home / Self Care | Payer: Medicaid Other | Source: Ambulatory Visit | Attending: Obstetrics and Gynecology | Admitting: Obstetrics and Gynecology

## 2020-08-25 VITALS — BP 106/76 | HR 96 | Wt 135.0 lb

## 2020-08-25 DIAGNOSIS — Z30430 Encounter for insertion of intrauterine contraceptive device: Secondary | ICD-10-CM | POA: Insufficient documentation

## 2020-08-25 DIAGNOSIS — Z124 Encounter for screening for malignant neoplasm of cervix: Secondary | ICD-10-CM | POA: Diagnosis not present

## 2020-08-25 DIAGNOSIS — Z3202 Encounter for pregnancy test, result negative: Secondary | ICD-10-CM

## 2020-08-25 LAB — POCT PREGNANCY, URINE: Preg Test, Ur: NEGATIVE

## 2020-08-25 MED ORDER — LEVONORGESTREL 20.1 MCG/DAY IU IUD
1.0000 | INTRAUTERINE_SYSTEM | Freq: Once | INTRAUTERINE | Status: AC
  Administered 2020-08-25: 1 via INTRAUTERINE

## 2020-08-25 NOTE — Progress Notes (Addendum)
Post Partum Visit Note  Eileen Abbott is a 30 y.o. G4P1001 female who presents for a postpartum visit. She is 5 weeks postpartum following a normal spontaneous vaginal delivery.  I have fully reviewed the prenatal and intrapartum course. The delivery was at [redacted]w[redacted]d gestational weeks. Anesthesia: none. Postpartum course has been uncomplicated. Baby is doing well. Baby is feeding by breast. Bleeding no bleeding. Bowel function is normal. Bladder function is normal. Patient is sexually active. Contraception method is condoms. Postpartum depression screening: negative. She has returned back to work, which she is happy about. She had a postplacental IUD placed which fell out 2 weeks ago. She noticed the strings getting longer and had a poking sensation. Went to bathroom and it fell out. Has IUD in her purse at home. She has been sexually active but has used condoms with every act of IC.    The pregnancy intention screening data noted above was reviewed. Potential methods of contraception were discussed. The patient elected to proceed with Hormonal Implant.     Health Maintenance Due  Topic Date Due   PAP SMEAR-Modifier  Never done   COVID-19 Vaccine (3 - Booster for Moderna series) 12/02/2019    The following portions of the patient's history were reviewed and updated as appropriate: allergies, current medications, past family history, past medical history, past social history, past surgical history, and problem list.  Review of Systems Pertinent items are noted in HPI.  Objective:  BP 106/76   Pulse 96   Wt 135 lb (61.2 kg)   LMP 10/01/2019   Breastfeeding Yes   BMI 26.37 kg/m    General:  alert and no distress   Breasts:  normal  Lungs: Effort normal  Heart:  Regular rate  Abdomen: soft, non-tender; bowel sounds normal; no masses,  no organomegaly   Wound N/a  GU exam:   External genitalia normal, vaginal mucosa pink, cervix clean without lesions/masses, no vaginal bleeding or  discharge        IUD Procedure Note Patient identified, informed consent performed.  Discussed risks of irregular bleeding, cramping, infection, malpositioning or misplacement of the IUD outside the uterus which may require further procedures. Time out was performed.  Urine pregnancy test negative.  Speculum placed in the vagina. Cervix visualized. Cleaned with Betadine x 2.  Grasped anteriorly with a single tooth tenaculum. Uterus sounded to 6 cm. Liletta IUD placed per manufacturer's recommendations. Strings trimmed to 3 cm. Tenaculum was removed, good hemostasis noted. Patient tolerated procedure well.   Patient was given post-procedure instructions and the Liletta care card with expiration date. Patient was also asked to check IUD strings periodically and follow up in 4-6 weeks for IUD check.   Assessment:   Normal postpartum exam   Plan:   Essential components of care per ACOG recommendations:  1.  Mood and well being: Patient with negative depression screening today. Reviewed local resources for support.  - Patient tobacco use? No.   - hx of drug use? No.    2. Infant care and feeding:  -Patient currently breastmilk feeding? Yes. Discussed returning to work and pumping.  -Social determinants of health (SDOH) reviewed in EPIC. No concerns.  3. Sexuality, contraception and birth spacing - Patient does not want a pregnancy in the next year.  Desired family size is unknown.  - Reviewed forms of contraception in tiered fashion. Patient desired IUD today.   - Discussed birth spacing of 18 months  4. Sleep and fatigue -Encouraged  family/partner/community support of 4 hrs of uninterrupted sleep to help with mood and fatigue  5. Physical Recovery  - Discussed patients delivery and complications. She describes her labor as good. - Patient had a Vaginal, no problems at delivery. Patient had a  labial  laceration. Perineal healing reviewed. Patient expressed understanding - Patient  has urinary incontinence? No. - Patient is safe to resume physical and sexual activity  6.  Health Maintenance - HM due items addressed Yes - Last pap smear No results found for: DIAGPAP Pap smear done at today's visit.  -Breast Cancer screening indicated? No.   7. Chronic Disease/Pregnancy Condition follow up: None  Brand Males, MSN, CNM 08/25/20 10:27 AM

## 2020-08-28 LAB — CYTOLOGY - PAP
Comment: NEGATIVE
Diagnosis: UNDETERMINED — AB
High risk HPV: NEGATIVE

## 2020-10-08 ENCOUNTER — Ambulatory Visit (INDEPENDENT_AMBULATORY_CARE_PROVIDER_SITE_OTHER): Payer: Medicaid Other

## 2020-10-08 ENCOUNTER — Other Ambulatory Visit: Payer: Self-pay

## 2020-10-08 ENCOUNTER — Other Ambulatory Visit (HOSPITAL_COMMUNITY)
Admission: RE | Admit: 2020-10-08 | Discharge: 2020-10-08 | Disposition: A | Discharge status: Home / Self Care | Payer: Medicaid Other | Source: Ambulatory Visit

## 2020-10-08 VITALS — BP 122/89 | HR 109 | Wt 135.1 lb

## 2020-10-08 DIAGNOSIS — Z113 Encounter for screening for infections with a predominantly sexual mode of transmission: Secondary | ICD-10-CM | POA: Insufficient documentation

## 2020-10-08 DIAGNOSIS — N898 Other specified noninflammatory disorders of vagina: Secondary | ICD-10-CM

## 2020-10-08 NOTE — Progress Notes (Signed)
   GYNECOLOGY CLINIC PROGRESS NOTE  History:  30 y.o. G1P1001 here at [facility] today for today for IUD string check; Liletta IUD was placed on 08/25/2020. No complaints about the IUD. Patient reports vaginal discharge and odor for about 2 weeks. Describes discharge as thin, white/clear with "sewery" smell. No itching/irritation. Requesting STI screening.  The following portions of the patient's history were reviewed and updated as appropriate: allergies, current medications, past family history, past medical history, past social history, past surgical history and problem list. Last pap smear on 08/25/2020 was ASCUS, negative HRHPV.  Review of Systems:  Pertinent items are noted in HPI.   Objective:  Physical Exam Blood pressure 122/89, pulse (!) 109, weight 135 lb 1.6 oz (61.3 kg), last menstrual period 09/25/2020, currently breastfeeding. Gen: NAD Abd: Soft, nontender and nondistended Pelvic: Normal appearing external genitalia; normal appearing vaginal mucosa and cervix. Small amount of thin, white discharge. IUD strings visualized, about 3 cm in length outside cervix.   Assessment & Plan:  Normal IUD check. Patient to keep IUD in place for six years for contraception; can come in for removal if she desires pregnancy within the next six years. Routine preventative health maintenance measures emphasized. Repeat pap in 3 years per current ASCCP guidelines  1. Vaginal odor  - Cervicovaginal ancillary only( [place]) - Hepatitis C Antibody - HIV Antibody (routine testing w rflx) - RPR - Hepatitis B Surface AntiGEN  2. Screening for STD (sexually transmitted disease)  - Cervicovaginal ancillary only( [place]) - Hepatitis C Antibody - HIV Antibody (routine testing w rflx) - RPR - Hepatitis B Surface AntiGEN    Brand Males, CNM 8:39 AM

## 2020-10-09 LAB — HIV ANTIBODY (ROUTINE TESTING W REFLEX): HIV Screen 4th Generation wRfx: NONREACTIVE

## 2020-10-09 LAB — CERVICOVAGINAL ANCILLARY ONLY
Bacterial Vaginitis (gardnerella): NEGATIVE
Candida Glabrata: NEGATIVE
Candida Vaginitis: NEGATIVE
Chlamydia: NEGATIVE
Comment: NEGATIVE
Comment: NEGATIVE
Comment: NEGATIVE
Comment: NEGATIVE
Comment: NEGATIVE
Comment: NORMAL
Neisseria Gonorrhea: NEGATIVE
Trichomonas: NEGATIVE

## 2020-10-09 LAB — RPR: RPR Ser Ql: NONREACTIVE

## 2020-10-09 LAB — HEPATITIS C ANTIBODY: Hep C Virus Ab: 0.2 s/co ratio (ref 0.0–0.9)

## 2020-10-09 LAB — HEPATITIS B SURFACE ANTIGEN: Hepatitis B Surface Ag: NEGATIVE

## 2020-10-12 ENCOUNTER — Telehealth: Payer: Self-pay | Admitting: Family Medicine

## 2020-10-12 NOTE — Telephone Encounter (Signed)
Patient called, state she have mastitis causing her to have extreme pain, she want to speak to a nurse

## 2020-10-13 MED ORDER — DICLOXACILLIN SODIUM 500 MG PO CAPS: 500.0000 mg | ORAL_CAPSULE | Freq: Four times a day (QID) | ORAL | 0 refills | Status: DC

## 2020-10-13 NOTE — Telephone Encounter (Signed)
Mom reports pain to right breast and warm to touch. She is having chills and feeling hot. The area is not red. She reports she is using Ibuprofen and is helping.   Patient reports she has a firm lump in the area of the pain.   She has returned to work and is pumping 2 x a day while at work. Infant is sleeping through the night also. She is taking Ibuprofen and finds relief in taking.   Advised :   warm wet compresses every 2-3 hours for 10-15 minutes.  Very gentle massage  Pump to empty the breast  Point baby's nose or chin towards blocked area  Continue Ibuprofen   Start Dicloxicillin as prescribed and take entire dosage.   Call if lump not resolving in the next 48 hours and report, may take longer for any other symptoms to resolve.   Patient voiced understanding to all the above.

## 2020-10-14 NOTE — Telephone Encounter (Signed)
Chart reviewed for nurse visit. Agree with plan of care.   Venora Maples, MD 10/14/2020 7:52 AM

## 2020-11-06 DIAGNOSIS — F4323 Adjustment disorder with mixed anxiety and depressed mood: Secondary | ICD-10-CM | POA: Diagnosis not present

## 2020-11-12 ENCOUNTER — Other Ambulatory Visit: Payer: Self-pay

## 2020-11-12 ENCOUNTER — Ambulatory Visit (INDEPENDENT_AMBULATORY_CARE_PROVIDER_SITE_OTHER): Payer: Medicaid Other | Admitting: Nurse Practitioner

## 2020-11-12 ENCOUNTER — Encounter: Payer: Self-pay | Admitting: Nurse Practitioner

## 2020-11-12 VITALS — BP 110/68 | HR 93 | Temp 97.8°F | Ht 59.6 in | Wt 129.0 lb

## 2020-11-12 DIAGNOSIS — J4520 Mild intermittent asthma, uncomplicated: Secondary | ICD-10-CM

## 2020-11-12 DIAGNOSIS — E559 Vitamin D deficiency, unspecified: Secondary | ICD-10-CM

## 2020-11-12 DIAGNOSIS — Z0000 Encounter for general adult medical examination without abnormal findings: Secondary | ICD-10-CM

## 2020-11-12 DIAGNOSIS — Z7689 Persons encountering health services in other specified circumstances: Secondary | ICD-10-CM

## 2020-11-12 DIAGNOSIS — M797 Fibromyalgia: Secondary | ICD-10-CM

## 2020-11-12 NOTE — Patient Instructions (Signed)

## 2020-11-12 NOTE — Progress Notes (Signed)
I,Tianna Badgett,acting as a Education administrator for [employer], NP.,have documented all relevant documentation on the behalf of [employer], NP,as directed by  Bary Castilla, NP while in the presence of Bary Castilla, NP.  This visit occurred during the SARS-CoV-2 public health emergency.  Safety protocols were in place, including screening questions prior to the visit, additional usage of staff PPE, and extensive cleaning of exam room while observing appropriate contact time as indicated for disinfecting solutions.  Subjective:     Patient ID: Eileen Abbott , female    DOB: 1990/11/06 , 30 y.o.   MRN: [id number]   Chief Complaint  Patient presents with   Establish Care    HPI  Patient is here to establish care. She was seeing Dr. Damian Leavell. Baby is 50 month old. She has fibromyalgia and she was taking Cymbalta for it.  Diet: She tries to eat pretty healthy.  Exercise: yoga  She is breastfeeding. She does have a an IUD.     Past Medical History:  Diagnosis Date   Asthma    Attention deficit disorder    Fibromyalgia      Family History  Problem Relation Age of Onset   Diabetes Mother    Arthritis Mother    Fibromyalgia Mother    Irritable bowel syndrome Father    Vitiligo Father    Asthma Sister    Hypertension Maternal Grandmother    Arthritis Maternal Grandmother    Fibromyalgia Maternal Grandmother    Irritable bowel syndrome Maternal Grandfather    Cancer Paternal Grandmother    Cancer Paternal Grandfather     No current outpatient medications on file.   Allergies  Allergen Reactions   Banana Nausea And Vomiting   Eggs Or Egg-Derived Products Nausea And Vomiting      The patient states she uses IUD for birth control. Last LMP was No LMP recorded.. Negative for Dysmenorrhea. Negative for: breast discharge, breast lump(s), breast pain and breast self exam. Associated symptoms include abnormal vaginal bleeding. Pertinent negatives include  abnormal bleeding (hematology), anxiety, decreased libido, depression, difficulty falling sleep, dyspareunia, history of infertility, nocturia, sexual dysfunction, sleep disturbances, urinary incontinence, urinary urgency, vaginal discharge and vaginal itching. Diet regular.The patient states her exercise level is    . The patient's tobacco use is: none  Social History   Tobacco Use  Smoking Status Former   Packs/day: 0.50   Years: 7.00   Pack years: 3.50   Types: Cigarettes  Smokeless Tobacco Never  . She has been exposed to passive smoke. The patient's alcohol use is:  Social History   Substance and Sexual Activity  Alcohol Use Not Currently   Comment: socially  . Additional information: Last pap 2022, next one scheduled for 3 years.    Review of Systems  Constitutional:  Negative for chills, fatigue and fever.  HENT: Negative.  Negative for congestion and sneezing.   Eyes: Negative.   Respiratory: Negative.  Negative for choking and wheezing.   Cardiovascular: Negative.  Negative for chest pain.  Gastrointestinal: Negative.  Negative for constipation, diarrhea and nausea.  Endocrine: Negative.   Genitourinary: Negative.   Musculoskeletal: Negative.  Negative for arthralgias and myalgias.       Hx of fibromyalgia   Skin: Negative.   Allergic/Immunologic: Negative.   Neurological: Negative.  Negative for weakness, numbness and headaches.  Hematological: Negative.   Psychiatric/Behavioral: Negative.      Today's Vitals   11/12/20 0955  BP: 110/68  Pulse: 93  Temp: 97.8 F (36.6 C)  TempSrc: Oral  Weight: 129 lb (58.5 kg)  Height: 4' 11.6" (1.514 m)   Body mass index is 25.53 kg/m.   Objective:  Physical Exam Vitals and nursing note reviewed.  Constitutional:      Appearance: Normal appearance.  HENT:     Head: Normocephalic and atraumatic.     Right Ear: Tympanic membrane, ear canal and external ear normal.     Left Ear: Tympanic membrane, ear canal and  external ear normal.     Nose: Nose normal. No congestion or rhinorrhea.     Mouth/Throat:     Mouth: Mucous membranes are moist.     Pharynx: Oropharynx is clear.  Eyes:     Extraocular Movements: Extraocular movements intact.     Conjunctiva/sclera: Conjunctivae normal.     Pupils: Pupils are equal, round, and reactive to light.  Cardiovascular:     Rate and Rhythm: Normal rate and regular rhythm.     Pulses: Normal pulses.     Heart sounds: Normal heart sounds. No murmur heard. Pulmonary:     Effort: Pulmonary effort is normal. No respiratory distress.     Breath sounds: Normal breath sounds. No wheezing.  Abdominal:     General: Abdomen is flat. Bowel sounds are normal.     Palpations: Abdomen is soft.  Genitourinary:    Comments: deferred Musculoskeletal:        General: Normal range of motion.     Cervical back: Normal range of motion and neck supple.  Skin:    General: Skin is warm and dry.     Capillary Refill: Capillary refill takes less than 2 seconds.  Neurological:     General: No focal deficit present.     Mental Status: She is alert and oriented to person, place, and time.  Psychiatric:        Mood and Affect: Mood normal.        Behavior: Behavior normal.        Assessment And Plan:     1. Establishing care with new doctor, encounter for -Patient is here to establish care. Martin Majestic over patient medical, family, social and surgical history. -Reviewed with patient their medications and any allergies  -Reviewed with patient their sexual orientation, drug/tobacco and alcohol use -Dicussed any new concerns with patient  -recommended patient comes in for a physical exam and complete blood work.  -Educated patient about the importance of annual screenings and immunizations.  -Advised patient to eat a healthy diet along with exercise for atleast 30-45 min atleast 4-5 days of the week.   2. Encounter for annual health examination -Patient is here for their annual  physical exam and we discussed any changes to medication and medical history.  -Behavior modification was discussed as well as diet and exercise history  -Patient will continue to exercise regularly and modify their diet.  -Recommendation for yearly physical annuals, immunization and screenings including mammogram and colonoscopy were discussed with the patient.  -Recommended intake of multivitamin, vitamin D and calcium.  -Individualized advise was given to the patient pertaining to their own health history in regards to diet, exercise, medical condition and referrals.  - CMP14+EGFR - CBC no Diff - Hemoglobin A1c - Lipid panel  3. Fibromyalgia -she was taking Cymbalta prior to her becoming pregnant. She is currently breastfeeding and would like to go back to taking it. I have advised the patient that it is a medium category for breastfeeding and suggested she  advises with her OBGYN on the risk and benefit of taking it during lactation. Pt. Agrees to the plan.   4. Vitamin D deficiency -Will check and supplement if needed. Advised patient to spend atleast 15 min. Daily in sunlight.  - Vitamin D (25 hydroxy)  5. Mild intermittent asthma without complication -Mild, controlled -pt takes albuterol as needed  The patient was encouraged to call or send a message through [facility] for any questions or concerns.   Follow up: if symptoms persist or do not get better.   Side effects and appropriate use of all the medication(s) were discussed with the patient today. Patient advised to use the medication(s) as directed by their healthcare provider. The patient was encouraged to read, review, and understand all associated package inserts and contact our office with any questions or concerns. The patient accepts the risks of the treatment plan and had an opportunity to ask questions.   Staying healthy and adopting a healthy lifestyle for your overall health is important. You should eat 7 or more servings  of fruits and vegetables per day. You should drink plenty of water to keep yourself hydrated and your kidneys healthy. This includes about 65-80+ fluid ounces of water. Limit your intake of animal fats especially for elevated cholesterol. Avoid highly processed food and limit your salt intake if you have hypertension. Avoid foods high in saturated/Trans fats. Along with a healthy diet it is also very important to maintain time for yourself to maintain a healthy mental health with low stress levels. You should get atleast 150 min of moderate intensity exercise weekly for a healthy heart. Along with eating right and exercising, aim for at least 7-9 hours of sleep daily.  Eat more whole grains which includes barley, wheat berries, oats, brown rice and whole wheat pasta. Use healthy plant oils which include olive, soy, corn, sunflower and peanut. Limit your caffeine and sugary drinks. Limit your intake of fast foods. Limit milk and dairy products to one or two daily servings.   Patient was given opportunity to ask questions. Patient verbalized understanding of the plan and was able to repeat key elements of the plan. All questions were answered to their satisfaction.  Raman Rocket Gunderson, DNP   I, Raman Bryse Blanchette have reviewed all documentation for this visit. The documentation on 11/12/20 for the exam, diagnosis, procedures, and orders are all accurate and complete.    THE PATIENT IS ENCOURAGED TO PRACTICE SOCIAL DISTANCING DUE TO THE COVID-19 PANDEMIC.

## 2020-11-13 LAB — CMP14+EGFR
ALT: 14 IU/L (ref 0–32)
AST: 16 IU/L (ref 0–40)
Albumin/Globulin Ratio: 2 (ref 1.2–2.2)
Albumin: 4.8 g/dL (ref 3.8–4.8)
Alkaline Phosphatase: 54 IU/L (ref 44–121)
BUN/Creatinine Ratio: 9 (ref 9–23)
BUN: 9 mg/dL (ref 6–20)
Bilirubin Total: 0.3 mg/dL (ref 0.0–1.2)
CO2: 26 mmol/L (ref 20–29)
Calcium: 9.8 mg/dL (ref 8.7–10.2)
Chloride: 102 mmol/L (ref 96–106)
Creatinine, Ser: 0.95 mg/dL (ref 0.57–1.00)
Globulin, Total: 2.4 g/dL (ref 1.5–4.5)
Glucose: 60 mg/dL — ABNORMAL LOW (ref 65–99)
Potassium: 4.9 mmol/L (ref 3.5–5.2)
Sodium: 142 mmol/L (ref 134–144)
Total Protein: 7.2 g/dL (ref 6.0–8.5)
eGFR: 82 mL/min/{1.73_m2} (ref >59)

## 2020-11-13 LAB — LIPID PANEL
Chol/HDL Ratio: 2.3 ratio (ref 0.0–4.4)
Cholesterol, Total: 153 mg/dL (ref 100–199)
HDL: 68 mg/dL (ref >39)
LDL Chol Calc (NIH): 75 mg/dL (ref 0–99)
Triglycerides: 42 mg/dL (ref 0–149)
VLDL Cholesterol Cal: 10 mg/dL (ref 5–40)

## 2020-11-13 LAB — HEMOGLOBIN A1C
Est. average glucose Bld gHb Est-mCnc: 100 mg/dL
Hgb A1c MFr Bld: 5.1 % (ref 4.8–5.6)

## 2020-11-13 LAB — CBC
Hematocrit: 44.5 % (ref 34.0–46.6)
Hemoglobin: 14.7 g/dL (ref 11.1–15.9)
MCH: 28.9 pg (ref 26.6–33.0)
MCHC: 33 g/dL (ref 31.5–35.7)
MCV: 87 fL (ref 79–97)
Platelets: 240 10*3/uL (ref 150–450)
RBC: 5.09 x10E6/uL (ref 3.77–5.28)
RDW: 14 % (ref 11.7–15.4)
WBC: 4.8 10*3/uL (ref 3.4–10.8)

## 2020-11-13 LAB — VITAMIN D 25 HYDROXY (VIT D DEFICIENCY, FRACTURES): Vit D, 25-Hydroxy: 27.4 ng/mL — ABNORMAL LOW (ref 30.0–100.0)

## 2020-11-17 ENCOUNTER — Other Ambulatory Visit: Payer: Self-pay | Admitting: Nurse Practitioner

## 2020-11-17 DIAGNOSIS — E559 Vitamin D deficiency, unspecified: Secondary | ICD-10-CM

## 2020-12-22 DIAGNOSIS — F4323 Adjustment disorder with mixed anxiety and depressed mood: Secondary | ICD-10-CM | POA: Diagnosis not present

## 2021-02-11 DIAGNOSIS — F4323 Adjustment disorder with mixed anxiety and depressed mood: Secondary | ICD-10-CM | POA: Diagnosis not present

## 2021-02-20 ENCOUNTER — Ambulatory Visit (HOSPITAL_COMMUNITY)
Admission: EM | Admit: 2021-02-20 | Discharge: 2021-02-20 | Disposition: A | Discharge status: Home / Self Care | Payer: Medicaid Other | Attending: Medical Oncology | Admitting: Medical Oncology

## 2021-02-20 ENCOUNTER — Inpatient Hospital Stay (HOSPITAL_COMMUNITY): Payer: Medicaid Other | Admitting: Anesthesiology

## 2021-02-20 ENCOUNTER — Encounter (HOSPITAL_COMMUNITY): Payer: Self-pay | Admitting: Medical Oncology

## 2021-02-20 ENCOUNTER — Inpatient Hospital Stay (HOSPITAL_COMMUNITY)
Admission: EM | Admit: 2021-02-20 | Discharge: 2021-02-24 | DRG: 342 | Disposition: A | Discharge status: Home / Self Care | Payer: Medicaid Other | Attending: Surgery | Admitting: Surgery

## 2021-02-20 ENCOUNTER — Other Ambulatory Visit: Payer: Self-pay

## 2021-02-20 ENCOUNTER — Emergency Department (HOSPITAL_COMMUNITY): Payer: Medicaid Other

## 2021-02-20 ENCOUNTER — Encounter (HOSPITAL_COMMUNITY): Payer: Self-pay

## 2021-02-20 ENCOUNTER — Encounter (HOSPITAL_COMMUNITY): Admission: EM | Disposition: A | Discharge status: Home / Self Care | Payer: Self-pay | Source: Home / Self Care

## 2021-02-20 DIAGNOSIS — K3532 Acute appendicitis with perforation and localized peritonitis, without abscess: Secondary | ICD-10-CM | POA: Diagnosis not present

## 2021-02-20 DIAGNOSIS — K352 Acute appendicitis with generalized peritonitis, without abscess: Principal | ICD-10-CM | POA: Diagnosis present

## 2021-02-20 DIAGNOSIS — Z8249 Family history of ischemic heart disease and other diseases of the circulatory system: Secondary | ICD-10-CM

## 2021-02-20 DIAGNOSIS — Z20822 Contact with and (suspected) exposure to covid-19: Secondary | ICD-10-CM | POA: Diagnosis present

## 2021-02-20 DIAGNOSIS — K567 Ileus, unspecified: Secondary | ICD-10-CM | POA: Diagnosis not present

## 2021-02-20 DIAGNOSIS — Z833 Family history of diabetes mellitus: Secondary | ICD-10-CM

## 2021-02-20 DIAGNOSIS — R21 Rash and other nonspecific skin eruption: Secondary | ICD-10-CM | POA: Diagnosis not present

## 2021-02-20 DIAGNOSIS — R000 Tachycardia, unspecified: Secondary | ICD-10-CM | POA: Diagnosis not present

## 2021-02-20 DIAGNOSIS — M797 Fibromyalgia: Secondary | ICD-10-CM | POA: Diagnosis present

## 2021-02-20 DIAGNOSIS — R509 Fever, unspecified: Secondary | ICD-10-CM

## 2021-02-20 DIAGNOSIS — R109 Unspecified abdominal pain: Secondary | ICD-10-CM | POA: Diagnosis not present

## 2021-02-20 DIAGNOSIS — T886XXA Anaphylactic reaction due to adverse effect of correct drug or medicament properly administered, initial encounter: Secondary | ICD-10-CM | POA: Diagnosis not present

## 2021-02-20 DIAGNOSIS — T450X5A Adverse effect of antiallergic and antiemetic drugs, initial encounter: Secondary | ICD-10-CM | POA: Diagnosis not present

## 2021-02-20 DIAGNOSIS — Z975 Presence of (intrauterine) contraceptive device: Secondary | ICD-10-CM

## 2021-02-20 DIAGNOSIS — Z91012 Allergy to eggs: Secondary | ICD-10-CM | POA: Diagnosis not present

## 2021-02-20 DIAGNOSIS — Z87891 Personal history of nicotine dependence: Secondary | ICD-10-CM | POA: Diagnosis not present

## 2021-02-20 DIAGNOSIS — R1031 Right lower quadrant pain: Secondary | ICD-10-CM

## 2021-02-20 DIAGNOSIS — Z809 Family history of malignant neoplasm, unspecified: Secondary | ICD-10-CM | POA: Diagnosis not present

## 2021-02-20 DIAGNOSIS — Z91018 Allergy to other foods: Secondary | ICD-10-CM

## 2021-02-20 DIAGNOSIS — Z825 Family history of asthma and other chronic lower respiratory diseases: Secondary | ICD-10-CM | POA: Diagnosis not present

## 2021-02-20 DIAGNOSIS — K3580 Unspecified acute appendicitis: Secondary | ICD-10-CM | POA: Diagnosis not present

## 2021-02-20 DIAGNOSIS — Z8261 Family history of arthritis: Secondary | ICD-10-CM | POA: Diagnosis not present

## 2021-02-20 HISTORY — DX: Acute appendicitis with perforation, localized peritonitis, and gangrene, without abscess: K35.32

## 2021-02-20 HISTORY — PX: LAPAROSCOPIC APPENDECTOMY: SHX408

## 2021-02-20 LAB — I-STAT BETA HCG BLOOD, ED (MC, WL, AP ONLY): I-stat hCG, quantitative: <5 m[IU]/mL (ref <5)

## 2021-02-20 LAB — CBC WITH DIFFERENTIAL/PLATELET
Abs Immature Granulocytes: 0.14 10*3/uL — ABNORMAL HIGH (ref 0.00–0.07)
Basophils Absolute: 0.1 10*3/uL (ref 0.0–0.1)
Basophils Relative: 0 %
Eosinophils Absolute: 0.1 10*3/uL (ref 0.0–0.5)
Eosinophils Relative: 1 %
HCT: 44.6 % (ref 36.0–46.0)
Hemoglobin: 14.8 g/dL (ref 12.0–15.0)
Immature Granulocytes: 1 %
Lymphocytes Relative: 3 %
Lymphs Abs: 0.7 10*3/uL (ref 0.7–4.0)
MCH: 29.6 pg (ref 26.0–34.0)
MCHC: 33.2 g/dL (ref 30.0–36.0)
MCV: 89.2 fL (ref 80.0–100.0)
Monocytes Absolute: 1.8 10*3/uL — ABNORMAL HIGH (ref 0.1–1.0)
Monocytes Relative: 9 %
Neutro Abs: 18.1 10*3/uL — ABNORMAL HIGH (ref 1.7–7.7)
Neutrophils Relative %: 86 %
Platelets: 237 10*3/uL (ref 150–400)
RBC: 5 MIL/uL (ref 3.87–5.11)
RDW: 12.7 % (ref 11.5–15.5)
WBC: 20.9 10*3/uL — ABNORMAL HIGH (ref 4.0–10.5)
nRBC: 0 % (ref 0.0–0.2)

## 2021-02-20 LAB — COMPREHENSIVE METABOLIC PANEL
ALT: 18 U/L (ref 0–44)
AST: 18 U/L (ref 15–41)
Albumin: 4 g/dL (ref 3.5–5.0)
Alkaline Phosphatase: 55 U/L (ref 38–126)
Anion gap: 10 (ref 5–15)
BUN: 7 mg/dL (ref 6–20)
CO2: 25 mmol/L (ref 22–32)
Calcium: 9.2 mg/dL (ref 8.9–10.3)
Chloride: 99 mmol/L (ref 98–111)
Creatinine, Ser: 1.01 mg/dL — ABNORMAL HIGH (ref 0.44–1.00)
GFR, Estimated: >60 mL/min (ref >60)
Glucose, Bld: 118 mg/dL — ABNORMAL HIGH (ref 70–99)
Potassium: 3.8 mmol/L (ref 3.5–5.1)
Sodium: 134 mmol/L — ABNORMAL LOW (ref 135–145)
Total Bilirubin: 1.2 mg/dL (ref 0.3–1.2)
Total Protein: 7.7 g/dL (ref 6.5–8.1)

## 2021-02-20 LAB — URINALYSIS, ROUTINE W REFLEX MICROSCOPIC
Bilirubin Urine: NEGATIVE
Glucose, UA: NEGATIVE mg/dL
Hgb urine dipstick: NEGATIVE
Ketones, ur: 80 mg/dL — AB
Leukocytes,Ua: NEGATIVE
Nitrite: NEGATIVE
Protein, ur: NEGATIVE mg/dL
Specific Gravity, Urine: 1.042 — ABNORMAL HIGH (ref 1.005–1.030)
pH: 6 (ref 5.0–8.0)

## 2021-02-20 LAB — POCT URINALYSIS DIPSTICK, ED / UC
Glucose, UA: NEGATIVE mg/dL
Hgb urine dipstick: NEGATIVE
Ketones, ur: >=160 mg/dL — AB
Leukocytes,Ua: NEGATIVE
Nitrite: NEGATIVE
Protein, ur: 100 mg/dL — AB
Specific Gravity, Urine: >=1.03 (ref 1.005–1.030)
Urobilinogen, UA: 0.2 mg/dL (ref 0.0–1.0)
pH: 6 (ref 5.0–8.0)

## 2021-02-20 LAB — POC URINE PREG, ED: Preg Test, Ur: NEGATIVE

## 2021-02-20 LAB — PROTIME-INR
INR: 1.2 (ref 0.8–1.2)
Prothrombin Time: 14.9 seconds (ref 11.4–15.2)

## 2021-02-20 LAB — LACTIC ACID, PLASMA
Lactic Acid, Venous: 1.5 mmol/L (ref 0.5–1.9)
Lactic Acid, Venous: 1.5 mmol/L (ref 0.5–1.9)

## 2021-02-20 LAB — RESP PANEL BY RT-PCR (FLU A&B, COVID) ARPGX2
Influenza A by PCR: NEGATIVE
Influenza B by PCR: NEGATIVE
SARS Coronavirus 2 by RT PCR: NEGATIVE

## 2021-02-20 LAB — MAGNESIUM: Magnesium: 1.8 mg/dL (ref 1.7–2.4)

## 2021-02-20 LAB — LIPASE, BLOOD: Lipase: 25 U/L (ref 11–51)

## 2021-02-20 LAB — APTT: aPTT: 29 seconds (ref 24–36)

## 2021-02-20 SURGERY — APPENDECTOMY, LAPAROSCOPIC
Anesthesia: General

## 2021-02-20 MED ORDER — FENTANYL CITRATE (PF) 100 MCG/2ML IJ SOLN
INTRAMUSCULAR | Status: DC | PRN
  Administered 2021-02-20: 50 ug via INTRAVENOUS
  Administered 2021-02-20: 100 ug via INTRAVENOUS

## 2021-02-20 MED ORDER — PROMETHAZINE HCL 25 MG/ML IJ SOLN
6.2500 mg | INTRAMUSCULAR | Status: DC | PRN
Start: 2021-02-20 — End: 2021-02-21

## 2021-02-20 MED ORDER — PROPOFOL 10 MG/ML IV BOLUS
INTRAVENOUS | Status: DC | PRN
  Administered 2021-02-20: 140 mg via INTRAVENOUS

## 2021-02-20 MED ORDER — SUCCINYLCHOLINE CHLORIDE 200 MG/10ML IV SOSY
PREFILLED_SYRINGE | INTRAVENOUS | Status: AC
  Filled 2021-02-20: qty 10

## 2021-02-20 MED ORDER — FENTANYL CITRATE (PF) 250 MCG/5ML IJ SOLN
INTRAMUSCULAR | Status: AC
  Filled 2021-02-20: qty 5

## 2021-02-20 MED ORDER — SIMETHICONE 80 MG PO CHEW
80.0000 mg | CHEWABLE_TABLET | Freq: Four times a day (QID) | ORAL | Status: DC | PRN
  Administered 2021-02-21: 80 mg via ORAL
  Filled 2021-02-20 (×2): qty 1

## 2021-02-20 MED ORDER — PHENYLEPHRINE HCL-NACL 20-0.9 MG/250ML-% IV SOLN
INTRAVENOUS | Status: DC | PRN
  Administered 2021-02-20: 25 ug/min via INTRAVENOUS

## 2021-02-20 MED ORDER — FENTANYL CITRATE (PF) 100 MCG/2ML IJ SOLN
25.0000 ug | INTRAMUSCULAR | Status: DC | PRN
Start: 2021-02-20 — End: 2021-02-21

## 2021-02-20 MED ORDER — PHENYLEPHRINE 40 MCG/ML (10ML) SYRINGE FOR IV PUSH (FOR BLOOD PRESSURE SUPPORT)
PREFILLED_SYRINGE | INTRAVENOUS | Status: AC
  Filled 2021-02-20: qty 10

## 2021-02-20 MED ORDER — MIDAZOLAM HCL 2 MG/2ML IJ SOLN
INTRAMUSCULAR | Status: AC
  Filled 2021-02-20: qty 2

## 2021-02-20 MED ORDER — ONDANSETRON HCL 4 MG/2ML IJ SOLN
INTRAMUSCULAR | Status: AC
  Filled 2021-02-20: qty 2

## 2021-02-20 MED ORDER — ONDANSETRON HCL 4 MG/2ML IJ SOLN
INTRAMUSCULAR | Status: DC | PRN
  Administered 2021-02-20: 4 mg via INTRAVENOUS

## 2021-02-20 MED ORDER — SUCCINYLCHOLINE CHLORIDE 200 MG/10ML IV SOSY
PREFILLED_SYRINGE | INTRAVENOUS | Status: DC | PRN
  Administered 2021-02-20: 100 mg via INTRAVENOUS

## 2021-02-20 MED ORDER — PROPOFOL 10 MG/ML IV BOLUS
INTRAVENOUS | Status: AC
  Filled 2021-02-20: qty 20

## 2021-02-20 MED ORDER — OXYCODONE HCL 5 MG PO TABS
5.0000 mg | ORAL_TABLET | ORAL | Status: DC | PRN
  Administered 2021-02-20 – 2021-02-23 (×9): 5 mg via ORAL
  Filled 2021-02-20 (×9): qty 1

## 2021-02-20 MED ORDER — PIPERACILLIN-TAZOBACTAM 3.375 G IVPB
3.3750 g | Freq: Three times a day (TID) | INTRAVENOUS | Status: DC
  Administered 2021-02-21 – 2021-02-24 (×10): 3.375 g via INTRAVENOUS
  Filled 2021-02-20 (×10): qty 50

## 2021-02-20 MED ORDER — LACTATED RINGERS IV BOLUS
1000.0000 mL | Freq: Once | INTRAVENOUS | Status: AC
  Administered 2021-02-20: 1000 mL via INTRAVENOUS

## 2021-02-20 MED ORDER — ROCURONIUM BROMIDE 10 MG/ML (PF) SYRINGE
PREFILLED_SYRINGE | INTRAVENOUS | Status: AC
  Filled 2021-02-20: qty 10

## 2021-02-20 MED ORDER — MIDAZOLAM HCL 5 MG/5ML IJ SOLN
INTRAMUSCULAR | Status: DC | PRN
  Administered 2021-02-20 (×2): 1 mg via INTRAVENOUS

## 2021-02-20 MED ORDER — BUPIVACAINE-EPINEPHRINE 0.25% -1:200000 IJ SOLN
INTRAMUSCULAR | Status: DC | PRN
  Administered 2021-02-20: 10 mL
  Administered 2021-02-20: 20 mL

## 2021-02-20 MED ORDER — PHENYLEPHRINE HCL (PRESSORS) 10 MG/ML IV SOLN
INTRAVENOUS | Status: DC | PRN
  Administered 2021-02-20: 40 ug via INTRAVENOUS

## 2021-02-20 MED ORDER — ACETAMINOPHEN 325 MG PO TABS
650.0000 mg | ORAL_TABLET | Freq: Four times a day (QID) | ORAL | Status: DC
  Administered 2021-02-20 – 2021-02-21 (×2): 650 mg via ORAL
  Filled 2021-02-20 (×3): qty 2

## 2021-02-20 MED ORDER — MAGNESIUM SULFATE 2 GM/50ML IV SOLN
2.0000 g | Freq: Once | INTRAVENOUS | Status: AC
  Administered 2021-02-20: 2 g via INTRAVENOUS
  Filled 2021-02-20: qty 50

## 2021-02-20 MED ORDER — SODIUM CHLORIDE 0.9 % IR SOLN
Status: DC | PRN
  Administered 2021-02-20: 3000 mL

## 2021-02-20 MED ORDER — PROCHLORPERAZINE EDISYLATE 10 MG/2ML IJ SOLN: 10.0000 mg | INTRAMUSCULAR | Status: DC | PRN

## 2021-02-20 MED ORDER — SUGAMMADEX SODIUM 200 MG/2ML IV SOLN
INTRAVENOUS | Status: DC | PRN
  Administered 2021-02-20: 150 mg via INTRAVENOUS

## 2021-02-20 MED ORDER — DOCUSATE SODIUM 100 MG PO CAPS
100.0000 mg | ORAL_CAPSULE | Freq: Two times a day (BID) | ORAL | Status: DC
  Administered 2021-02-21: 100 mg via ORAL
  Filled 2021-02-20: qty 1

## 2021-02-20 MED ORDER — LACTATED RINGERS IV SOLN: INTRAVENOUS | Status: DC

## 2021-02-20 MED ORDER — OXYCODONE HCL 5 MG PO TABS
10.0000 mg | ORAL_TABLET | ORAL | Status: DC | PRN
  Administered 2021-02-21 – 2021-02-22 (×4): 10 mg via ORAL
  Filled 2021-02-20 (×4): qty 2

## 2021-02-20 MED ORDER — MORPHINE SULFATE (PF) 4 MG/ML IV SOLN
4.0000 mg | Freq: Once | INTRAVENOUS | Status: AC
  Administered 2021-02-20: 4 mg via INTRAVENOUS
  Filled 2021-02-20: qty 1

## 2021-02-20 MED ORDER — ACETAMINOPHEN 10 MG/ML IV SOLN: 1000.0000 mg | Freq: Once | INTRAVENOUS | Status: DC | PRN

## 2021-02-20 MED ORDER — DEXAMETHASONE SODIUM PHOSPHATE 4 MG/ML IJ SOLN
INTRAMUSCULAR | Status: DC | PRN
  Administered 2021-02-20: 5 mg via INTRAVENOUS

## 2021-02-20 MED ORDER — PIPERACILLIN-TAZOBACTAM 3.375 G IVPB 30 MIN
3.3750 g | Freq: Once | INTRAVENOUS | Status: AC
  Administered 2021-02-20: 3.375 g via INTRAVENOUS
  Filled 2021-02-20: qty 50

## 2021-02-20 MED ORDER — BUPIVACAINE-EPINEPHRINE (PF) 0.25% -1:200000 IJ SOLN
INTRAMUSCULAR | Status: AC
  Filled 2021-02-20: qty 30

## 2021-02-20 MED ORDER — HYDROMORPHONE HCL 1 MG/ML IJ SOLN: 1.0000 mg | INTRAMUSCULAR | Status: DC | PRN

## 2021-02-20 MED ORDER — ROCURONIUM BROMIDE 100 MG/10ML IV SOLN
INTRAVENOUS | Status: DC | PRN
  Administered 2021-02-20: 30 mg via INTRAVENOUS
  Administered 2021-02-20: 5 mg via INTRAVENOUS

## 2021-02-20 MED ORDER — IOHEXOL 300 MG/ML  SOLN
100.0000 mL | Freq: Once | INTRAMUSCULAR | Status: AC | PRN
  Administered 2021-02-20: 100 mL via INTRAVENOUS

## 2021-02-20 MED ORDER — DEXAMETHASONE SODIUM PHOSPHATE 10 MG/ML IJ SOLN
INTRAMUSCULAR | Status: AC
  Filled 2021-02-20: qty 1

## 2021-02-20 MED ORDER — METHOCARBAMOL 1000 MG/10ML IJ SOLN
500.0000 mg | Freq: Four times a day (QID) | INTRAVENOUS | Status: DC | PRN
  Filled 2021-02-20 (×2): qty 5

## 2021-02-20 MED ORDER — ENOXAPARIN SODIUM 40 MG/0.4ML IJ SOSY
40.0000 mg | PREFILLED_SYRINGE | INTRAMUSCULAR | Status: DC
  Administered 2021-02-21 – 2021-02-23 (×3): 40 mg via SUBCUTANEOUS
  Filled 2021-02-20 (×3): qty 0.4

## 2021-02-20 MED ORDER — SODIUM CHLORIDE 0.9 % IV BOLUS
1000.0000 mL | INTRAVENOUS | Status: DC
  Administered 2021-02-20: 1000 mL via INTRAVENOUS

## 2021-02-20 MED ORDER — LIDOCAINE HCL (CARDIAC) PF 50 MG/5ML IV SOSY
PREFILLED_SYRINGE | INTRAVENOUS | Status: DC | PRN
  Administered 2021-02-20: 40 mg via INTRAVENOUS

## 2021-02-20 MED ORDER — LACTATED RINGERS IV SOLN: INTRAVENOUS | Status: DC | PRN

## 2021-02-20 SURGICAL SUPPLY — 46 items
ADH SKN CLS APL DERMABOND .7 (GAUZE/BANDAGES/DRESSINGS) ×1
APL PRP STRL LF DISP 70% ISPRP (MISCELLANEOUS) ×1
APPLIER CLIP 5 13 M/L LIGAMAX5 (MISCELLANEOUS)
APR CLP MED LRG 5 ANG JAW (MISCELLANEOUS)
BAG COUNTER SPONGE SURGICOUNT (BAG) ×2 IMPLANT
BAG SPEC RTRVL LRG 6X4 10 (ENDOMECHANICALS) ×1
BAG SPNG CNTER NS LX DISP (BAG) ×1
BAG SURGICOUNT SPONGE COUNTING (BAG) ×1
BLADE CLIPPER SURG (BLADE) ×3 IMPLANT
CHLORAPREP W/TINT 26 (MISCELLANEOUS) ×3 IMPLANT
CLIP APPLIE 5 13 M/L LIGAMAX5 (MISCELLANEOUS) IMPLANT
COVER SURGICAL LIGHT HANDLE (MISCELLANEOUS) ×3 IMPLANT
CUTTER FLEX LINEAR 45M (STAPLE) ×3 IMPLANT
DERMABOND ADVANCED (GAUZE/BANDAGES/DRESSINGS) ×2
DERMABOND ADVANCED .7 DNX12 (GAUZE/BANDAGES/DRESSINGS) ×1 IMPLANT
DRAIN CHANNEL 19F RND (DRAIN) ×3 IMPLANT
ELECT REM PT RETURN 9FT ADLT (ELECTROSURGICAL) ×3
ELECTRODE REM PT RTRN 9FT ADLT (ELECTROSURGICAL) ×1 IMPLANT
EVACUATOR SILICONE 100CC (DRAIN) ×3 IMPLANT
GLOVE SURG ENC MOIS LTX SZ7.5 (GLOVE) ×3 IMPLANT
GLOVE SURG UNDER LTX SZ8 (GLOVE) ×3 IMPLANT
GOWN STRL REUS W/ TWL LRG LVL3 (GOWN DISPOSABLE) ×2 IMPLANT
GOWN STRL REUS W/ TWL XL LVL3 (GOWN DISPOSABLE) ×1 IMPLANT
GOWN STRL REUS W/TWL LRG LVL3 (GOWN DISPOSABLE) ×6
GOWN STRL REUS W/TWL XL LVL3 (GOWN DISPOSABLE) ×3
GRASPER SUT TROCAR 14GX15 (MISCELLANEOUS) ×3 IMPLANT
KIT BASIN OR (CUSTOM PROCEDURE TRAY) ×3 IMPLANT
KIT TURNOVER KIT B (KITS) ×3 IMPLANT
NEEDLE INSUFFLATION 14GA 120MM (NEEDLE) ×3 IMPLANT
NS IRRIG 1000ML POUR BTL (IV SOLUTION) ×3 IMPLANT
PAD ARMBOARD 7.5X6 YLW CONV (MISCELLANEOUS) ×6 IMPLANT
POUCH SPECIMEN RETRIEVAL 10MM (ENDOMECHANICALS) ×3 IMPLANT
RELOAD 45 VASCULAR/THIN (ENDOMECHANICALS) ×3 IMPLANT
SCISSORS LAP 5X35 DISP (ENDOMECHANICALS) ×3 IMPLANT
SET IRRIG TUBING LAPAROSCOPIC (IRRIGATION / IRRIGATOR) ×3 IMPLANT
SET TUBE SMOKE EVAC HIGH FLOW (TUBING) ×3 IMPLANT
SHEARS HARMONIC ACE PLUS 36CM (ENDOMECHANICALS) ×3 IMPLANT
SLEEVE ENDOPATH XCEL 5M (ENDOMECHANICALS) ×3 IMPLANT
SPECIMEN JAR SMALL (MISCELLANEOUS) ×3 IMPLANT
SUT ETHILON 2 0 FS 18 (SUTURE) ×3 IMPLANT
SUT MNCRL AB 4-0 PS2 18 (SUTURE) ×3 IMPLANT
TOWEL GREEN STERILE FF (TOWEL DISPOSABLE) ×3 IMPLANT
TRAY LAPAROSCOPIC MC (CUSTOM PROCEDURE TRAY) ×3 IMPLANT
TROCAR XCEL 12X100 BLDLESS (ENDOMECHANICALS) ×3 IMPLANT
TROCAR XCEL NON-BLD 5MMX100MML (ENDOMECHANICALS) ×3 IMPLANT
WATER STERILE IRR 1000ML POUR (IV SOLUTION) ×3 IMPLANT

## 2021-02-20 NOTE — ED Notes (Signed)
Called [facility] ED Charge twice to inform patient coming down, no answer either time.

## 2021-02-20 NOTE — ED Notes (Signed)
Patient is being discharged from the Urgent Care and sent to the Emergency Department via personal vehicle with family . Per Provider Clent Jacks, patient is in need of higher level of care due to possible appendicitis. Patient is aware and verbalizes understanding of plan of care.  Vitals:   02/20/21 1245  BP: 100/87  Pulse: (!) 146  Resp: 18  Temp: 99.7 F (37.6 C)  SpO2: 100%

## 2021-02-20 NOTE — Transfer of Care (Signed)
Immediate Anesthesia Transfer of Care Note  Patient: Eileen Abbott  Procedure(s) Performed: APPENDECTOMY LAPAROSCOPIC  Patient Location: PACU  Anesthesia Type:General  Level of Consciousness: awake and alert   Airway & Oxygen Therapy: Patient Spontanous Breathing and Patient connected to nasal cannula oxygen  Post-op Assessment: Report given to RN, Post -op Vital signs reviewed and stable and Patient moving all extremities  Post vital signs: Reviewed and stable  Last Vitals:  Vitals Value Taken Time  BP 106/60 02/20/21 2336  Temp    Pulse 102 02/20/21 2337  Resp 21 02/20/21 2337  SpO2 100 % 02/20/21 2337  Vitals shown include unvalidated device data.  Last Pain:  Vitals:   02/20/21 1921  TempSrc:   PainSc: 4          Complications: No notable events documented.

## 2021-02-20 NOTE — ED Triage Notes (Signed)
Pt presents to the office for fever and abdominal pain for several days. She is currently breastfeeding.

## 2021-02-20 NOTE — ED Notes (Signed)
ED Provider at bedside. 

## 2021-02-20 NOTE — Anesthesia Preprocedure Evaluation (Addendum)
Anesthesia Evaluation  Patient identified by MRN, date of birth, ID band Patient awake    Reviewed: Allergy & Precautions, NPO status , Patient's Chart, lab work & pertinent test results  Airway Mallampati: II  TM Distance: >3 FB     Dental  (+) Teeth Intact, Dental Advisory Given   Pulmonary asthma (last inhaler use today) , former smoker,    Pulmonary exam normal        Cardiovascular hypertension,  Rhythm:Regular Rate:Normal     Neuro/Psych negative neurological ROS  negative psych ROS   GI/Hepatic Neg liver ROS, Appendicitis    Endo/Other  negative endocrine ROS  Renal/GU negative Renal ROS  negative genitourinary   Musculoskeletal negative musculoskeletal ROS (+) Fibromyalgia -  Abdominal Normal abdominal exam  (+)   Peds  Hematology negative hematology ROS (+)   Anesthesia Other Findings   Reproductive/Obstetrics                          Anesthesia Physical Anesthesia Plan  ASA: 2 and emergent  Anesthesia Plan: General   Post-op Pain Management:    Induction: Intravenous  PONV Risk Score and Plan: 3 and Ondansetron, Dexamethasone, Midazolam and Treatment may vary due to age or medical condition  Airway Management Planned: Mask and Oral ETT  Additional Equipment: None  Intra-op Plan:   Post-operative Plan: Extubation in OR  Informed Consent: I have reviewed the patients History and Physical, chart, labs and discussed the procedure including the risks, benefits and alternatives for the proposed anesthesia with the patient or authorized representative who has indicated his/her understanding and acceptance.     Dental advisory given  Plan Discussed with: CRNA  Anesthesia Plan Comments: (Lab Results      Component                Value               Date                      WBC                      20.9 (H)            02/20/2021                HGB                       14.8                02/20/2021                HCT                      44.6                02/20/2021                MCV                      89.2                02/20/2021                PLT                      237  02/20/2021           Lab Results      Component                Value               Date                      NA                       134 (L)             02/20/2021                K                        3.8                 02/20/2021                CO2                      25                  02/20/2021                GLUCOSE                  118 (H)             02/20/2021                BUN                      7                   02/20/2021                CREATININE               1.01 (H)            02/20/2021                CALCIUM                  9.2                 02/20/2021                EGFR                     82                  11/12/2020                GFRNONAA                 >60                 02/20/2021          )      Anesthesia Quick Evaluation

## 2021-02-20 NOTE — ED Triage Notes (Signed)
Pt arrived via POV from [facility] for 10/10 sharp pain in RLQx2 days. Pt has N/Vx2 days. Pt last BM was yesterday.

## 2021-02-20 NOTE — ED Provider Notes (Signed)
Emergency Medicine Provider Triage Evaluation Note  Eileen Abbott , a 30 y.o. female  was evaluated in triage.  Pt complains of two days of abdominal pain.  She reports right lower abdominal pain.  She started having fevers same day.  She reports two episodes of vomiting.  No diarrhea today, only yesterday.    Dysuria.  No frequency or urgency.    No abnormal discharge.   Has an IUD, no cycles.  She did have some spotting a few days ago though.   Review of Systems  Positive: RLQ abdominal pain, fever, N/V/D Negative: Syncope.   Physical Exam  BP 96/66   Pulse (!) 137   Temp 100.1 F (37.8 C) (Oral)   Resp 14   Ht 4' 11.5" (1.511 m)   Wt 58.5 kg   SpO2 98%   BMI 25.61 kg/m  Gen:   Awake,appears uncomfortable.  Resp:  Normal effort  MSK:   Moves extremities without difficulty  Other:  Abdomen is tender to palpation, primarily in right lower quadrant.  She has guarding.   Medical Decision Making  Medically screening exam initiated at 5:18 PM.  Appropriate orders placed.  Mindi B Larzelere was informed that the remainder of the evaluation will be completed by another provider, this initial triage assessment does not replace that evaluation, and the importance of remaining in the ED until their evaluation is complete.  Patient appears unwell, she is tachycardic and febrile with softer pressures on arrival. Labs are ordered.  CT scan is ordered.  Given her tachycardia fever and softer pressures blood cultures are ordered.  She does not clearly sepsis criteria at this time and she does not clearly have a bacterial infectious source.   Norman Clay 02/20/21 1728    Cheryll Cockayne, MD 02/20/21 2002

## 2021-02-20 NOTE — Op Note (Addendum)
Patient: Eileen Abbott (23-Jul-1990, [id number])  Date of Surgery: 02/20/2021   Preoperative Diagnosis: PERFORATED APPENDICITIS   Postoperative Diagnosis: PERFORATED APPENDICITIS   Surgical Procedure: APPENDECTOMY LAPAROSCOPIC:    Operative Team Members:  Surgeon(s) and Role:    * Zymir Napoli, Hyman Hopes, MD - Primary   Anesthesiologist: Atilano Median, DO CRNA: Edmonia Caprio, CRNA   Anesthesia: General   Fluids:  Total I/O In: 1050 [IV Piggyback:1050] Out: -   Complications: None  Drains:  (19 Fr) Jackson-Pratt drain(s) with closed bulb suction in the suprapubic region draining the right pericolic gutter and pelvis    Specimen:  ID Type Source Tests Collected by Time Destination  1 : appendix Tissue PATH Appendix SURGICAL PATHOLOGY Cherell Colvin, Hyman Hopes, MD 02/20/2021 2252      Disposition:  PACU - hemodynamically stable.  Plan of Care: Admit to inpatient     Indications for Procedure: Eileen Abbott is a 30 y.o. female who presented with abdominal pain.  History, physical and imaging was concerning for appendicitis, so laparoscopic appendectomy was recommended for the patient.  The procedure itself, as well as the risks, benefits and alternatives were discussed with the patient.  Risks discussed included but were not limited to the risk of bleeding, infection, damage to nearby structures, need to convert to open procedure, incisional hernia, and the need for additional procedures or surgeries.  With this discussion complete and all questions answered the patient granted consent to proceed.  Findings: Perforated appendicitis with purulent peritonitis  Infection status: Patient: Eileen Abbott Emergency General Surgery Service Patient Case: Emergent Infection Present At Time Of Surgery (PATOS): Purulent Peritonitis   Description of Procedure:   On the date stated above, the patient was taken to the operating room suite and placed in supine positioning with  the left arm tucked.  Sequential compression devices were placed on the lower extremities to prevent blood clots.  General endotracheal anesthesia was induced.  The patient urinated just prior to surgery so a foley catheter was not placed.  Preoperative antibiotics (piperacillin-tazobactam) were given prior to surgery.  The patient's abdomen was prepped and draped in the usual sterile fashion.  A time-out was completed verifying the correct patient, procedure, positioning and equipment needed for the case.  We began by anesthetizing the skin with local anesthetic and then making a 5 mm incision just below the umbilicus.  We dissected through the subcutaneous tissues to the fascia.  The fascia was grasped and elevated using a Kocher clamp.  A Veress needle was inserted into the abdomen and the abdomen was insufflated to 15 mmHg.  A 5 mm trocar was inserted in this position under optical guidance and then the abdomen was inspected.  There was no trauma to the underlying viscera with initial trocar placement.  Any abnormal findings, other than inflammation in the right lower quadrant, are listed above in the findings section.  Two additional trocars were placed, one 5 mm trocar suprapubically and one 12 mm trocar in the left lower quadrant.  These were placed under direct vision without any trauma to the underlying viscera.    The patient was then placed in head down, left side down positioning.  The appendix was identified and dissected free from its attachments to the abdominal wall, small intestine and cecum.  A window was created in the mesoappendix using blunt dissection.  We used one 45 mm white load of the endoscopic linear stapler to divide the base of the appendix from the  cecum.  The mesoappendix was divided with the harmonic scapal.  The appendix was placed in an endocatch bag and removed through the 12 mm port site in the left lower quadrant.  The operative field was dry on final inspection. The staple  line was well formed.   At this point we directed our attention to closure.  A 19 Fr round JP drain was placed through the suprapubic port site and positioned in the right pericolic gutter down to the pelvis.  The patient was moved back to a level position.  The 12 mm trocar site was closed at the fascial level using an 0-vicryl on a fascial suture passer.  The abdomen was desufflated.  The skin was closed using 4-0 Monocryl and dermabond.  All sponge and needle counts were correct at the end of the case.    Ivar Drape, MD General, Bariatric, & Minimally Invasive Surgery [facility], [place]

## 2021-02-20 NOTE — H&P (Signed)
Admitting Physician: Hyman Hopes Saleem Coccia  Service: General surgery  CC: Abdominal pain  Subjective   HPI: Eileen Abbott is an 30 y.o. female who is here for abdominal pain.  It has been going on for two days.  It is located in the right lower quadrant.  She had vomiting associated with the pain.  She had fevers the last two days as well.  She has a 29 month old son at home and is breastfeeding.  Past Medical History:  Diagnosis Date   Asthma    Attention deficit disorder    Fibromyalgia     Past Surgical History:  Procedure Laterality Date   NO PAST SURGERIES      Family History  Problem Relation Age of Onset   Diabetes Mother    Arthritis Mother    Fibromyalgia Mother    Irritable bowel syndrome Father    Vitiligo Father    Asthma Sister    Hypertension Maternal Grandmother    Arthritis Maternal Grandmother    Fibromyalgia Maternal Grandmother    Irritable bowel syndrome Maternal Grandfather    Cancer Paternal Grandmother    Cancer Paternal Grandfather     Social:  reports that she has quit smoking. Her smoking use included cigarettes. She has a 3.50 pack-year smoking history. She has never used smokeless tobacco. She reports that she does not currently use alcohol. She reports that she does not currently use drugs.  Allergies:  Allergies  Allergen Reactions   Banana Nausea And Vomiting   Eggs Or Egg-Derived Products Nausea And Vomiting    Medications: No current outpatient medications  ROS - all of the below systems have been reviewed with the patient and positives are indicated with bold text General: chills, fever or night sweats Eyes: blurry vision or double vision ENT: epistaxis or sore throat Allergy/Immunology: itchy/watery eyes or nasal congestion Hematologic/Lymphatic: bleeding problems, blood clots or swollen lymph nodes Endocrine: temperature intolerance or unexpected weight changes Breast: new or changing breast lumps or nipple  discharge Resp: cough, shortness of breath, or wheezing CV: chest pain or dyspnea on exertion GI: as per HPI GU: dysuria, trouble voiding, or hematuria MSK: joint pain or joint stiffness Neuro: TIA or stroke symptoms Derm: pruritus and skin lesion changes Psych: anxiety and depression  Objective   PE Blood pressure 106/69, pulse (!) 122, temperature 100.1 F (37.8 C), temperature source Oral, resp. rate 18, height 4' 11.5" (1.511 m), weight 58.5 kg, SpO2 98 %, currently breastfeeding. Constitutional: NAD; conversant; no deformities Eyes: Moist conjunctiva; no lid lag; anicteric; PERRL Neck: Trachea midline; no thyromegaly Lungs: Normal respiratory effort; no tactile fremitus CV: RRR; no palpable thrills; no pitting edema GI: Abd Soft, tender RLQ; no palpable hepatosplenomegaly MSK: Normal range of motion of extremities; no clubbing/cyanosis Psychiatric: Appropriate affect; alert and oriented x3 Lymphatic: No palpable cervical or axillary lymphadenopathy  Results for orders placed or performed during the hospital encounter of 02/20/21 (from the past 24 hour(s))  Lactic acid, plasma     Status: None   Collection Time: 02/20/21  5:24 PM  Result Value Ref Range   Lactic Acid, Venous 1.5 0.5 - 1.9 mmol/L  Comprehensive metabolic panel     Status: Abnormal   Collection Time: 02/20/21  5:24 PM  Result Value Ref Range   Sodium 134 (L) 135 - 145 mmol/L   Potassium 3.8 3.5 - 5.1 mmol/L   Chloride 99 98 - 111 mmol/L   CO2 25 22 - 32 mmol/L  Glucose, Bld 118 (H) 70 - 99 mg/dL   BUN 7 6 - 20 mg/dL   Creatinine, Ser 1.01 (H) 0.44 - 1.00 mg/dL   Calcium 9.2 8.9 - 10.3 mg/dL   Total Protein 7.7 6.5 - 8.1 g/dL   Albumin 4.0 3.5 - 5.0 g/dL   AST 18 15 - 41 U/L   ALT 18 0 - 44 U/L   Alkaline Phosphatase 55 38 - 126 U/L   Total Bilirubin 1.2 0.3 - 1.2 mg/dL   GFR, Estimated >60 >60 mL/min   Anion gap 10 5 - 15  CBC WITH DIFFERENTIAL     Status: Abnormal   Collection Time: 02/20/21   5:24 PM  Result Value Ref Range   WBC 20.9 (H) 4.0 - 10.5 K/uL   RBC 5.00 3.87 - 5.11 MIL/uL   Hemoglobin 14.8 12.0 - 15.0 g/dL   HCT 44.6 36.0 - 46.0 %   MCV 89.2 80.0 - 100.0 fL   MCH 29.6 26.0 - 34.0 pg   MCHC 33.2 30.0 - 36.0 g/dL   RDW 12.7 11.5 - 15.5 %   Platelets 237 150 - 400 K/uL   nRBC 0.0 0.0 - 0.2 %   Neutrophils Relative % 86 %   Neutro Abs 18.1 (H) 1.7 - 7.7 K/uL   Lymphocytes Relative 3 %   Lymphs Abs 0.7 0.7 - 4.0 K/uL   Monocytes Relative 9 %   Monocytes Absolute 1.8 (H) 0.1 - 1.0 K/uL   Eosinophils Relative 1 %   Eosinophils Absolute 0.1 0.0 - 0.5 K/uL   Basophils Relative 0 %   Basophils Absolute 0.1 0.0 - 0.1 K/uL   Immature Granulocytes 1 %   Abs Immature Granulocytes 0.14 (H) 0.00 - 0.07 K/uL  Protime-INR     Status: None   Collection Time: 02/20/21  5:24 PM  Result Value Ref Range   Prothrombin Time 14.9 11.4 - 15.2 seconds   INR 1.2 0.8 - 1.2  APTT     Status: None   Collection Time: 02/20/21  5:24 PM  Result Value Ref Range   aPTT 29 24 - 36 seconds  Lipase, blood     Status: None   Collection Time: 02/20/21  5:24 PM  Result Value Ref Range   Lipase 25 11 - 51 U/L  Magnesium     Status: None   Collection Time: 02/20/21  5:24 PM  Result Value Ref Range   Magnesium 1.8 1.7 - 2.4 mg/dL  I-Stat beta hCG blood, ED     Status: None   Collection Time: 02/20/21  5:54 PM  Result Value Ref Range   I-stat hCG, quantitative <5.0 <5 mIU/mL   Comment 3          Resp Panel by RT-PCR (Flu A&B, Covid) Nasopharyngeal Swab     Status: None   Collection Time: 02/20/21  6:21 PM   Specimen: Nasopharyngeal Swab; Nasopharyngeal(NP) swabs in vial transport medium  Result Value Ref Range   SARS Coronavirus 2 by RT PCR NEGATIVE NEGATIVE   Influenza A by PCR NEGATIVE NEGATIVE   Influenza B by PCR NEGATIVE NEGATIVE  Urinalysis, Routine w reflex microscopic Urine, Clean Catch     Status: Abnormal   Collection Time: 02/20/21  7:31 PM  Result Value Ref Range   Color,  Urine YELLOW YELLOW   APPearance CLEAR CLEAR   Specific Gravity, Urine 1.042 (H) 1.005 - 1.030   pH 6.0 5.0 - 8.0   Glucose, UA NEGATIVE NEGATIVE mg/dL  Hgb urine dipstick NEGATIVE NEGATIVE   Bilirubin Urine NEGATIVE NEGATIVE   Ketones, ur 80 (A) NEGATIVE mg/dL   Protein, ur NEGATIVE NEGATIVE mg/dL   Nitrite NEGATIVE NEGATIVE   Leukocytes,Ua NEGATIVE NEGATIVE     Imaging Orders         CT ABDOMEN PELVIS W CONTRAST     Suspected acute appendicitis with adjacent extraluminal gas, raising concern for focal perforation. No drainable fluid collection/abscess. No free air.  Abnormal appendix, with focal dilatation measuring up to 12 mm distally (coronal image 40), and a suspected proximal appendicolith (coronal image 32), suspicious for acute appendicitis. Adjacent localized, irregular extraluminal gas (coronal image 35) raises concern for focal perforation. No drainable fluid collection/abscess.     Assessment and Plan   Eileen Abbott is an 30 y.o. female with acute appendicitis with possible perforation.  We discussed surgery versus antibiotics for the treatment of appendicitis.  I do not feel she is a good candidate for antibiotics as she clearly has a appendicolith on CT.  There is some concern voiced by the radiologist for possible perforation with a small amount of extraluminal gas, however with no drainable fluid collection and the appendicolith I feel surgery is a better option.  I discussed the procedure of laparoscopic appendectomy with the patient as well as its risk, benefits, and alternatives.  We discussed the need for possible open operation.  We discussed the risk of infection, bleeding, damage nearby structures, and postoperative abscess.  After full discussion all questions answered the patient granted consent to proceed.  We will proceed emergently this evening as there is some concern for perforation.  We will prepare for the operating room with antibiotics and  aggressive IV fluid resuscitation.  Felicie Morn, MD  [facility] Use [email or web address] to contact on call provider

## 2021-02-20 NOTE — Anesthesia Procedure Notes (Signed)
Procedure Name: Intubation Date/Time: 02/20/2021 10:02 PM Performed by: Edmonia Caprio, CRNA Pre-anesthesia Checklist: Patient identified, Emergency Drugs available, Suction available, Patient being monitored and Timeout performed Patient Re-evaluated:Patient Re-evaluated prior to induction Oxygen Delivery Method: Circle system utilized Induction Type: IV induction and Rapid sequence Laryngoscope Size: Miller and 2 Grade View: Grade II Tube type: Oral Tube size: 7.0 mm Number of attempts: 1 Airway Equipment and Method: Stylet Placement Confirmation: ETT inserted through vocal cords under direct vision, positive ETCO2 and breath sounds checked- equal and bilateral Secured at: 21 cm Tube secured with: Tape Dental Injury: Teeth and Oropharynx as per pre-operative assessment

## 2021-02-20 NOTE — ED Provider Notes (Signed)
[facility] EMERGENCY DEPARTMENT Provider Note   CSN: [id number] Arrival date & time: 02/20/21  1519     History Chief Complaint  Patient presents with   Abdominal Pain    Eileen Abbott is a 30 y.o. female.  Patient presents with abdominal pain for 2 days.  Describes sharp aching pain in the right lower quadrant.  She is had this for 2 days now without improvement and presents to the ER.  She notes low-grade fevers today.  Denies any vomiting or diarrhea.       Past Medical History:  Diagnosis Date   Asthma    Attention deficit disorder    Fibromyalgia     Patient Active Problem List   Diagnosis Date Noted   Acute perforated appendicitis 02/20/2021   Gestational hypertension 07/15/2020   Vaginal delivery 07/15/2020   IUD (intrauterine device) in place 07/15/2020   Marginal insertion of umbilical cord affecting management of mother 02/19/2020   Genetic carrier status 01/22/2020   Fibromyalgia    Rh negative status during pregnancy 11/08/2019    Past Surgical History:  Procedure Laterality Date   NO PAST SURGERIES       OB History     Gravida  1   Para  1   Term  1   Preterm      AB      Living  1      SAB      IAB      Ectopic      Multiple  0   Live Births  1           Family History  Problem Relation Age of Onset   Diabetes Mother    Arthritis Mother    Fibromyalgia Mother    Irritable bowel syndrome Father    Vitiligo Father    Asthma Sister    Hypertension Maternal Grandmother    Arthritis Maternal Grandmother    Fibromyalgia Maternal Grandmother    Irritable bowel syndrome Maternal Grandfather    Cancer Paternal Grandmother    Cancer Paternal Grandfather     Social History   Tobacco Use   Smoking status: Former    Packs/day: 0.50    Years: 7.00    Pack years: 3.50    Types: Cigarettes   Smokeless tobacco: Never  Vaping Use   Vaping Use: Never used  Substance Use Topics   Alcohol use: Not  Currently    Comment: socially   Drug use: Not Currently    Home Medications Prior to Admission medications   Not on File    Allergies    Banana and Eggs or egg-derived products  Review of Systems   Review of Systems  Constitutional:  Negative for fever.  HENT:  Negative for ear pain.   Eyes:  Negative for pain.  Respiratory:  Negative for cough.   Cardiovascular:  Negative for chest pain.  Gastrointestinal:  Positive for abdominal pain.  Genitourinary:  Negative for flank pain.  Musculoskeletal:  Negative for back pain.  Skin:  Negative for rash.  Neurological:  Negative for headaches.   Physical Exam Updated Vital Signs BP 102/62   Pulse (!) 114   Temp 100.1 F (37.8 C) (Oral)   Resp 11   Ht 4' 11.5" (1.511 m)   Wt 58.5 kg   SpO2 99%   BMI 25.61 kg/m   Physical Exam Constitutional:      General: She is not in acute distress.  Appearance: Normal appearance.  HENT:     Head: Normocephalic.     Nose: Nose normal.  Eyes:     Extraocular Movements: Extraocular movements intact.  Cardiovascular:     Rate and Rhythm: Tachycardia present.  Pulmonary:     Effort: Pulmonary effort is normal.  Abdominal:     Tenderness: There is abdominal tenderness in the right lower quadrant.  Musculoskeletal:        General: Normal range of motion.     Cervical back: Normal range of motion.  Neurological:     General: No focal deficit present.     Mental Status: She is alert. Mental status is at baseline.    ED Results / Procedures / Treatments   Labs (all labs ordered are listed, but only abnormal results are displayed) Labs Reviewed  COMPREHENSIVE METABOLIC PANEL - Abnormal; Notable for the following components:      Result Value   Sodium 134 (*)    Glucose, Bld 118 (*)    Creatinine, Ser 1.01 (*)    All other components within normal limits  CBC WITH DIFFERENTIAL/PLATELET - Abnormal; Notable for the following components:   WBC 20.9 (*)    Neutro Abs 18.1 (*)     Monocytes Absolute 1.8 (*)    Abs Immature Granulocytes 0.14 (*)    All other components within normal limits  URINALYSIS, ROUTINE W REFLEX MICROSCOPIC - Abnormal; Notable for the following components:   Specific Gravity, Urine 1.042 (*)    Ketones, ur 80 (*)    All other components within normal limits  RESP PANEL BY RT-PCR (FLU A&B, COVID) ARPGX2  CULTURE, BLOOD (ROUTINE X 2)  CULTURE, BLOOD (ROUTINE X 2)  LACTIC ACID, PLASMA  LACTIC ACID, PLASMA  PROTIME-INR  APTT  LIPASE, BLOOD  MAGNESIUM  CBC  CREATININE, SERUM  BASIC METABOLIC PANEL  CBC  I-STAT BETA HCG BLOOD, ED ([facility], [facility], [facility] ONLY)    EKG EKG Interpretation  Date/Time:  Saturday February 20 2021 20:34:17 EST Ventricular Rate:  115 PR Interval:  147 QRS Duration: 93 QT Interval:  314 QTC Calculation: 435 R Axis:   85 Text Interpretation: Sinus tachycardia Right atrial enlargement Confirmed by Norman Clay ([id number]) on 02/20/2021 9:42:56 PM  Radiology CT ABDOMEN PELVIS W CONTRAST  Result Date: 02/20/2021 CLINICAL DATA:  Right lower quadrant abdominal pain x2 days, nausea/vomiting EXAM: CT ABDOMEN AND PELVIS WITH CONTRAST TECHNIQUE: Multidetector CT imaging of the abdomen and pelvis was performed using the standard protocol following bolus administration of intravenous contrast. CONTRAST:  OMNIPAQUE IOHEXOL 300 MG/ML  SOLN COMPARISON:  None. FINDINGS: Lower chest: Lung bases are clear. Hepatobiliary: Liver is within normal limits, noting focal fat/altered perfusion along the falciform ligament. Gallbladder is unremarkable. No intrahepatic or extrahepatic ductal dilatation. Pancreas: Within normal limits. Spleen: Within normal limits. Adrenals/Urinary Tract: Adrenal glands are within normal limits. Kidneys are within normal limits.  No hydronephrosis. Bladder is within normal limits. Stomach/Bowel: Stomach is within normal limits. No evidence of bowel obstruction. Abnormal appendix, with focal dilatation measuring up to  12 mm distally (coronal image 40), and a suspected proximal appendicolith (coronal image 32), suspicious for acute appendicitis. Adjacent localized, irregular extraluminal gas (coronal image 35) raises concern for focal perforation. No drainable fluid collection/abscess. Vascular/Lymphatic: No evidence of abdominal aortic aneurysm. No suspicious abdominopelvic lymphadenopathy. Reproductive: Uterus is notable for an IUD in satisfactory position. Bilateral ovaries are within normal limits. Other: No abdominopelvic ascites. No free air. Musculoskeletal: Visualized osseous structures are within  normal limits. IMPRESSION: Suspected acute appendicitis with adjacent extraluminal gas, raising concern for focal perforation. No drainable fluid collection/abscess. No free air. Electronically Signed   By: Charline Bills M.D.   On: 02/20/2021 19:21    Procedures Procedures   Medications Ordered in ED Medications  sodium chloride 0.9 % bolus 1,000 mL (1,000 mLs Intravenous New Bag/Given 02/20/21 2121)  enoxaparin (LOVENOX) injection 40 mg (has no administration in time range)  lactated ringers infusion (has no administration in time range)  piperacillin-tazobactam (ZOSYN) IVPB 3.375 g (has no administration in time range)  acetaminophen (TYLENOL) tablet 650 mg (650 mg Oral Given 02/20/21 2101)  oxyCODONE (Oxy IR/ROXICODONE) immediate release tablet 5 mg (5 mg Oral Given 02/20/21 2101)  oxyCODONE (Oxy IR/ROXICODONE) immediate release tablet 10 mg (has no administration in time range)  HYDROmorphone (DILAUDID) injection 1 mg (has no administration in time range)  methocarbamol (ROBAXIN) 500 mg in dextrose 5 % 50 mL IVPB (has no administration in time range)  prochlorperazine (COMPAZINE) injection 10 mg (has no administration in time range)  simethicone (MYLICON) chewable tablet 80 mg (has no administration in time range)  docusate sodium (COLACE) capsule 100 mg (has no administration in time range)  magnesium  sulfate IVPB 2 g 50 mL (2 g Intravenous New Bag/Given 02/20/21 2110)  lactated ringers bolus 1,000 mL (0 mLs Intravenous Stopped 02/20/21 2025)  morphine 4 MG/ML injection 4 mg (4 mg Intravenous Given 02/20/21 1815)  iohexol (OMNIPAQUE) 300 MG/ML solution 100 mL (100 mLs Intravenous Contrast Given 02/20/21 1910)  piperacillin-tazobactam (ZOSYN) IVPB 3.375 g (0 g Intravenous Stopped 02/20/21 2102)    ED Course  I have reviewed the triage vital signs and the nursing notes.  Pertinent labs & imaging results that were available during my care of the patient were reviewed by me and considered in my medical decision making (see chart for details).  Clinical Course as of 02/20/21 2143  Sat Feb 20, 2021  1725 Long QT on ekg, HR 131, BP 110/71 [EH]    Clinical Course User Index [EH] Cristina Gong, PA-C   MDM Rules/Calculators/A&P                           Patient is tachycardic and tender in the right lower quadrant.  To the reports of IV fluids given, broad-spectrum antibiotic started.  CT abdomen pelvis concerning for perforated acute appendicitis. Surgical consultation requested, pending surgical evaluation.   Final Clinical Impression(s) / ED Diagnoses Final diagnoses:  Appendicitis with perforation    Rx / DC Orders ED Discharge Orders     None        Cheryll Cockayne, MD 02/20/21 2143

## 2021-02-20 NOTE — ED Provider Notes (Signed)
[facility]    CSN: [id number] Arrival date & time: 02/20/21  1045      History   Chief Complaint Chief Complaint  Patient presents with   Fever   Abdominal Pain    HPI Eileen Abbott is a 30 y.o. female.   HPI  Abdominal Pain: Patient states that she has had abdominal pain for the past several days but worsening.  Pain is mostly located in the right lower quadrant but does extend over to the left lower quadrant as well.  Pain is significantly worse when she bends over or tries to move.  She denies any injury.  She has had a fever as well with T-max of 100 F with no cold symptoms or sick contacts.  She denies any dysuria, urinary frequency, constipation.  She has had some nonbloody vomiting and diarrhea.  She has tried Tylenol for symptoms.  Of note she is currently breast-feeding.  She gave birth in May of this past year. Past Medical History:  Diagnosis Date   Asthma    Attention deficit disorder    Fibromyalgia     Patient Active Problem List   Diagnosis Date Noted   Gestational hypertension 07/15/2020   Vaginal delivery 07/15/2020   IUD (intrauterine device) in place 07/15/2020   Marginal insertion of umbilical cord affecting management of mother 02/19/2020   Genetic carrier status 01/22/2020   Fibromyalgia    Rh negative status during pregnancy 11/08/2019    Past Surgical History:  Procedure Laterality Date   NO PAST SURGERIES      OB History     Gravida  1   Para  1   Term  1   Preterm      AB      Living  1      SAB      IAB      Ectopic      Multiple  0   Live Births  1            Home Medications    Prior to Admission medications   Not on File    Family History Family History  Problem Relation Age of Onset   Diabetes Mother    Arthritis Mother    Fibromyalgia Mother    Irritable bowel syndrome Father    Vitiligo Father    Asthma Sister    Hypertension Maternal Grandmother    Arthritis Maternal  Grandmother    Fibromyalgia Maternal Grandmother    Irritable bowel syndrome Maternal Grandfather    Cancer Paternal Grandmother    Cancer Paternal Grandfather     Social History Social History   Tobacco Use   Smoking status: Former    Packs/day: 0.50    Years: 7.00    Pack years: 3.50    Types: Cigarettes   Smokeless tobacco: Never  Vaping Use   Vaping Use: Never used  Substance Use Topics   Alcohol use: Not Currently    Comment: socially   Drug use: Not Currently     Allergies   Banana and Eggs or egg-derived products   Review of Systems Review of Systems  As stated above in HPI Physical Exam Triage Vital Signs ED Triage Vitals  Enc Vitals Group     BP 02/20/21 1245 100/87     Pulse Rate 02/20/21 1245 (!) 146     Resp 02/20/21 1245 18     Temp 02/20/21 1245 99.7 F (37.6 C)  Temp Source 02/20/21 1245 Oral     SpO2 02/20/21 1245 100 %     Weight --      Height --      Head Circumference --      Peak Flow --      Pain Score 02/20/21 1246 0     Pain Loc --      Pain Edu? --      Excl. in GC? --    No data found.  Updated Vital Signs BP 100/87 (BP Location: Left Arm)   Pulse (!) 146   Temp 99.7 F (37.6 C) (Oral)   Resp 18   SpO2 100%   Physical Exam Vitals and nursing note reviewed.  Constitutional:      General: She is in acute distress.     Appearance: She is ill-appearing, toxic-appearing and diaphoretic.  HENT:     Head: Normocephalic and atraumatic.     Mouth/Throat:     Pharynx: Oropharynx is clear.     Comments: Slightly dry Eyes:     General: No scleral icterus.    Extraocular Movements: Extraocular movements intact.     Pupils: Pupils are equal, round, and reactive to light.  Cardiovascular:     Rate and Rhythm: Regular rhythm. Tachycardia present.     Heart sounds: Normal heart sounds.  Pulmonary:     Effort: Pulmonary effort is normal.     Breath sounds: Normal breath sounds.  Abdominal:     General: Abdomen is flat.  Bowel sounds are normal.     Palpations: Abdomen is rigid.     Tenderness: There is abdominal tenderness in the right lower quadrant and left lower quadrant. There is left CVA tenderness, guarding and rebound. There is no right CVA tenderness. Positive signs include McBurney's sign, psoas sign and obturator sign. Negative signs include Murphy's sign.     Hernia: No hernia is present.  Skin:    General: Skin is warm.     Coloration: Skin is not cyanotic, jaundiced or pale.  Neurological:     Mental Status: She is alert and oriented to person, place, and time.     UC Treatments / Results  Labs (all labs ordered are listed, but only abnormal results are displayed) Labs Reviewed  POCT URINALYSIS DIPSTICK, ED / UC - Abnormal; Notable for the following components:      Result Value   Bilirubin Urine SMALL (*)    Ketones, ur >=160 (*)    Protein, ur 100 (*)    All other components within normal limits  POC URINE PREG, ED    EKG   Radiology No results found.  Procedures Procedures (including critical care time)  Medications Ordered in UC Medications - No data to display  Initial Impression / Assessment and Plan / UC Course  I have reviewed the triage vital signs and the nursing notes.  Pertinent labs & imaging results that were available during my care of the patient were reviewed by me and considered in my medical decision making (see chart for details).     New. Significant concern for acute appendicitis versus other surgical abnormality given the extent of her pain, symptoms, fever. She wishes to have private vehicle transportation across our parking lot to the hospital by her father as opposed to EMS. Charge nurse alerted. NPO status.  Final Clinical Impressions(s) / UC Diagnoses   Final diagnoses:  None   Discharge Instructions   None    ED Prescriptions  None    PDMP not reviewed this encounter.   Rushie Chestnut, [place] 02/20/21 1446

## 2021-02-21 LAB — CBC
HCT: 37 % (ref 36.0–46.0)
Hemoglobin: 12.6 g/dL (ref 12.0–15.0)
MCH: 30.1 pg (ref 26.0–34.0)
MCHC: 34.1 g/dL (ref 30.0–36.0)
MCV: 88.3 fL (ref 80.0–100.0)
Platelets: 200 10*3/uL (ref 150–400)
RBC: 4.19 MIL/uL (ref 3.87–5.11)
RDW: 12.7 % (ref 11.5–15.5)
WBC: 14.1 10*3/uL — ABNORMAL HIGH (ref 4.0–10.5)
nRBC: 0 % (ref 0.0–0.2)

## 2021-02-21 LAB — BASIC METABOLIC PANEL
Anion gap: 8 (ref 5–15)
BUN: 6 mg/dL (ref 6–20)
CO2: 23 mmol/L (ref 22–32)
Calcium: 8 mg/dL — ABNORMAL LOW (ref 8.9–10.3)
Chloride: 102 mmol/L (ref 98–111)
Creatinine, Ser: 0.73 mg/dL (ref 0.44–1.00)
GFR, Estimated: >60 mL/min (ref >60)
Glucose, Bld: 137 mg/dL — ABNORMAL HIGH (ref 70–99)
Potassium: 3.6 mmol/L (ref 3.5–5.1)
Sodium: 133 mmol/L — ABNORMAL LOW (ref 135–145)

## 2021-02-21 LAB — CREATININE, SERUM
Creatinine, Ser: 0.73 mg/dL (ref 0.44–1.00)
GFR, Estimated: >60 mL/min

## 2021-02-21 MED ORDER — CALCIUM POLYCARBOPHIL 625 MG PO TABS
625.0000 mg | ORAL_TABLET | Freq: Two times a day (BID) | ORAL | Status: DC
  Administered 2021-02-21 – 2021-02-24 (×7): 625 mg via ORAL
  Filled 2021-02-21 (×8): qty 1

## 2021-02-21 MED ORDER — DIPHENHYDRAMINE HCL 50 MG/ML IJ SOLN
12.5000 mg | Freq: Four times a day (QID) | INTRAMUSCULAR | Status: DC | PRN
  Administered 2021-02-22: 25 mg via INTRAVENOUS
  Filled 2021-02-21 (×2): qty 1

## 2021-02-21 MED ORDER — ACETAMINOPHEN 500 MG PO TABS
1000.0000 mg | ORAL_TABLET | Freq: Four times a day (QID) | ORAL | Status: DC
  Administered 2021-02-21 – 2021-02-24 (×12): 1000 mg via ORAL
  Filled 2021-02-21 (×12): qty 2

## 2021-02-21 MED ORDER — ALUM & MAG HYDROXIDE-SIMETH 200-200-20 MG/5ML PO SUSP: 30.0000 mL | Freq: Four times a day (QID) | ORAL | Status: DC | PRN

## 2021-02-21 MED ORDER — BISACODYL 10 MG RE SUPP: 10.0000 mg | Freq: Two times a day (BID) | RECTAL | Status: DC | PRN

## 2021-02-21 MED ORDER — LIP MEDEX EX OINT
1.0000 "application " | TOPICAL_OINTMENT | Freq: Two times a day (BID) | CUTANEOUS | Status: DC
  Administered 2021-02-21 – 2021-02-24 (×7): 1 via TOPICAL
  Filled 2021-02-21: qty 7

## 2021-02-21 MED ORDER — METHOCARBAMOL 500 MG PO TABS: 1000.0000 mg | ORAL_TABLET | Freq: Four times a day (QID) | ORAL | Status: DC | PRN

## 2021-02-21 MED ORDER — LACTATED RINGERS IV BOLUS: 1000.0000 mL | Freq: Three times a day (TID) | INTRAVENOUS | Status: AC | PRN

## 2021-02-21 MED ORDER — SODIUM CHLORIDE 0.9% FLUSH: 3.0000 mL | INTRAVENOUS | Status: DC | PRN

## 2021-02-21 MED ORDER — HYDROMORPHONE HCL 1 MG/ML IJ SOLN
0.5000 mg | INTRAMUSCULAR | Status: DC | PRN
  Administered 2021-02-21 – 2021-02-22 (×3): 1 mg via INTRAVENOUS
  Filled 2021-02-21 (×3): qty 1

## 2021-02-21 MED ORDER — METHOCARBAMOL 1000 MG/10ML IJ SOLN: 1000.0000 mg | Freq: Four times a day (QID) | INTRAVENOUS | Status: DC | PRN

## 2021-02-21 MED ORDER — SODIUM CHLORIDE 0.9 % IV SOLN: 250.0000 mL | INTRAVENOUS | Status: DC | PRN

## 2021-02-21 MED ORDER — MAGIC MOUTHWASH
15.0000 mL | Freq: Four times a day (QID) | ORAL | Status: DC | PRN
  Filled 2021-02-21: qty 15

## 2021-02-21 MED ORDER — MENTHOL 3 MG MT LOZG
1.0000 | LOZENGE | OROMUCOSAL | Status: DC | PRN
  Filled 2021-02-21: qty 9

## 2021-02-21 MED ORDER — ALBUTEROL SULFATE (2.5 MG/3ML) 0.083% IN NEBU: 2.5000 mg | INHALATION_SOLUTION | RESPIRATORY_TRACT | Status: DC | PRN

## 2021-02-21 MED ORDER — SODIUM CHLORIDE 0.9% FLUSH
3.0000 mL | Freq: Two times a day (BID) | INTRAVENOUS | Status: DC
  Administered 2021-02-21 – 2021-02-24 (×3): 3 mL via INTRAVENOUS

## 2021-02-21 MED ORDER — METHOCARBAMOL 1000 MG/10ML IJ SOLN
1000.0000 mg | Freq: Four times a day (QID) | INTRAVENOUS | Status: DC | PRN
  Filled 2021-02-21: qty 10

## 2021-02-21 MED ORDER — PHENOL 1.4 % MT LIQD: 2.0000 | OROMUCOSAL | Status: DC | PRN

## 2021-02-21 NOTE — Progress Notes (Signed)
Eileen Abbott [id number] 02/11/1991  CARE TEAM:  PCP: Patient, No Pcp Per (Inactive)  Outpatient Care Team: Patient Care Team: Patient, No Pcp Per (Inactive) as PCP - General (General Practice)  Inpatient Treatment Team: Treatment Team: Attending Provider: Edison Pace, Md, MD; Rounding Team: Edison Pace, Md, MD; Technician: Veronda Prude, [place]; Utilization Review: Claudie Leach, RN; Technician: Loa Socks, NT; Registered Nurse: Harlin Heys, RN; Social Worker: Coralee Pesa, LCSWA   Problem List:   Principal Problem:   Acute perforated appendicitis   1 Day Post-Op  02/20/2021  Postoperative Diagnosis: PERFORATED APPENDICITIS    Surgical Procedure: APPENDECTOMY LAPAROSCOPIC:     Operative Team Members:  Surgeon(s) and Role:    * Stechschulte, Nickola Major, MD - Primary   Findings:   Perforated appendicitis with purulent peritonitis   Infection status: Patient: Eileen Abbott Emergency General Surgery Service Patient Case: Emergent Infection Present At Time Of Surgery (PATOS): Purulent Peritonitis     Assessment  Stabilizing  [facility] Stay = 1 days)  Plan:  -adv diet gradually since high risk for ileus IV antibiotics x5 days.  Piperacillin/tazobactam per protocol.  If meets discharge before 5 days, transition to oral antibiotics such as Augmentin -Follow-up IV fluids. -Improved pain control -Keep surgical drain for now.  Most likely removed next week if improves rapidly -VTE prophylaxis- SCDs, etc -mobilize as tolerated to help recovery  Disposition:  Disposition:  The patient is from: Home  Anticipate discharge to:  Home  Anticipated Date of Discharge is:  December 12,2022    Barriers to discharge:  Pending Clinical improvement (more likely than not)  Patient currently is NOT MEDICALLY STABLE for discharge from the hospital from a surgery standpoint.      20 minutes spent in review, evaluation, examination, counseling, and coordination of care.   I  have reviewed this patient's available data, including medical history, events of note, physical examination and test results as part of my evaluation.  A significant portion of that time was spent in counseling.  Care during the described time interval was provided by me.  02/21/2021    Subjective: (Chief complaint)  Sore and tired but better overall.  Trying some liquids.  Objective:  Vital signs:  Vitals:   02/21/21 0005 02/21/21 0047 02/21/21 0108 02/21/21 0308  BP: (!) 98/59 (!) 95/58 103/62 (!) 98/58  Pulse: 90 96 85 80  Resp: 15 16 18 14   Temp: 97.9 F (36.6 C) 98.1 F (36.7 C)  97.9 F (36.6 C)  TempSrc:  Oral  Oral  SpO2: 100% 100%  100%  Weight:      Height:        Last BM Date: 02/20/21  Intake/Output   Yesterday:  12/10 0701 - 12/11 0700 In: 1950 [I.V.:600; IV Piggyback:1050] Out: 125 [Drains:125] This shift:  No intake/output data recorded.  Bowel function:  Flatus: No  BM:  No  Drain: Serosanguinous   Physical Exam:  General: Pt awake/alert in no acute distress Eyes: PERRL, normal EOM.  Sclera clear.  No icterus Neuro: CN II-XII intact w/o focal sensory/motor deficits. Lymph: No head/neck/groin lymphadenopathy Psych:  No delerium/psychosis/paranoia.  Oriented x 4 HENT: Normocephalic, Mucus membranes moist.  No thrush Neck: Supple, No tracheal deviation.  No obvious thyromegaly Chest: No pain to chest wall compression.  Good respiratory excursion.  No audible wheezing CV:  Pulses intact.  Regular rhythm.  No major extremity edema MS: Normal AROM mjr joints.  No obvious deformity  Abdomen: Soft.  Mildy distended.  Mildly tender at incisions only.  No evidence of peritonitis.  No incarcerated hernias.  Ext:   No deformity.  No mjr edema.  No cyanosis Skin: No petechiae / purpurea.  No major sores.  Warm and dry    Results:   Cultures: Recent Results (from the past 720 hour(s))  Resp Panel by RT-PCR (Flu A&B, Covid) Nasopharyngeal  Swab     Status: None   Collection Time: 02/20/21  6:21 PM   Specimen: Nasopharyngeal Swab; Nasopharyngeal(NP) swabs in vial transport medium  Result Value Ref Range Status   SARS Coronavirus 2 by RT PCR NEGATIVE NEGATIVE Final    Comment: (NOTE) SARS-CoV-2 target nucleic acids are NOT DETECTED.  The SARS-CoV-2 RNA is generally detectable in upper respiratory specimens during the acute phase of infection. The lowest concentration of SARS-CoV-2 viral copies this assay can detect is 138 copies/mL. A negative result does not preclude SARS-Cov-2 infection and should not be used as the sole basis for treatment or other patient management decisions. A negative result may occur with  improper specimen collection/handling, submission of specimen other than nasopharyngeal swab, presence of viral mutation(s) within the areas targeted by this assay, and inadequate number of viral copies(<138 copies/mL). A negative result must be combined with clinical observations, patient history, and epidemiological information. The expected result is Negative.  Fact Sheet for Patients:  EntrepreneurPulse.com.au  Fact Sheet for Healthcare Providers:  IncredibleEmployment.be  This test is no t yet approved or cleared by the [place] FDA and  has been authorized for detection and/or diagnosis of SARS-CoV-2 by FDA under an Emergency Use Authorization (EUA). This EUA will remain  in effect (meaning this test can be used) for the duration of the COVID-19 declaration under Section 564(b)(1) of the Act, 21 U.S.C.section 360bbb-3(b)(1), unless the authorization is terminated  or revoked sooner.       Influenza A by PCR NEGATIVE NEGATIVE Final   Influenza B by PCR NEGATIVE NEGATIVE Final    Comment: (NOTE) The Xpert Xpress SARS-CoV-2/FLU/RSV plus assay is intended as an aid in the diagnosis of influenza from Nasopharyngeal swab specimens and should not be used as a sole  basis for treatment. Nasal washings and aspirates are unacceptable for Xpert Xpress SARS-CoV-2/FLU/RSV testing.  Fact Sheet for Patients: EntrepreneurPulse.com.au  Fact Sheet for Healthcare Providers: IncredibleEmployment.be  This test is not yet approved or cleared by the [place] FDA and has been authorized for detection and/or diagnosis of SARS-CoV-2 by FDA under an Emergency Use Authorization (EUA). This EUA will remain in effect (meaning this test can be used) for the duration of the COVID-19 declaration under Section 564(b)(1) of the Act, 21 U.S.C. section 360bbb-3(b)(1), unless the authorization is terminated or revoked.  Performed at [facility], [place] [address] [place], [place] [address]     Labs: Results for orders placed or performed during the hospital encounter of 02/20/21 (from the past 48 hour(s))  Lactic acid, plasma     Status: None   Collection Time: 02/20/21  5:24 PM  Result Value Ref Range   Lactic Acid, Venous 1.5 0.5 - 1.9 mmol/L    Comment: Performed at [facility], [address] [place], [place] [address]  Comprehensive metabolic panel     Status: Abnormal   Collection Time: 02/20/21  5:24 PM  Result Value Ref Range   Sodium 134 (L) 135 - 145 mmol/L   Potassium 3.8 3.5 - 5.1 mmol/L   Chloride 99 98 - 111 mmol/L  CO2 25 22 - 32 mmol/L   Glucose, Bld 118 (H) 70 - 99 mg/dL    Comment: Glucose reference range applies only to samples taken after fasting for at least 8 hours.   BUN 7 6 - 20 mg/dL   Creatinine, Ser 1.01 (H) 0.44 - 1.00 mg/dL   Calcium 9.2 8.9 - 10.3 mg/dL   Total Protein 7.7 6.5 - 8.1 g/dL   Albumin 4.0 3.5 - 5.0 g/dL   AST 18 15 - 41 U/L   ALT 18 0 - 44 U/L   Alkaline Phosphatase 55 38 - 126 U/L   Total Bilirubin 1.2 0.3 - 1.2 mg/dL   GFR, Estimated >60 >60 mL/min    Comment: (NOTE) Calculated using the CKD-EPI Creatinine Equation (2021)    Anion gap 10 5 - 15    Comment:  Performed at [facility] [address] [place], [place] [address]  CBC WITH DIFFERENTIAL     Status: Abnormal   Collection Time: 02/20/21  5:24 PM  Result Value Ref Range   WBC 20.9 (H) 4.0 - 10.5 K/uL   RBC 5.00 3.87 - 5.11 MIL/uL   Hemoglobin 14.8 12.0 - 15.0 g/dL   HCT 44.6 36.0 - 46.0 %   MCV 89.2 80.0 - 100.0 fL   MCH 29.6 26.0 - 34.0 pg   MCHC 33.2 30.0 - 36.0 g/dL   RDW 12.7 11.5 - 15.5 %   Platelets 237 150 - 400 K/uL   nRBC 0.0 0.0 - 0.2 %   Neutrophils Relative % 86 %   Neutro Abs 18.1 (H) 1.7 - 7.7 K/uL   Lymphocytes Relative 3 %   Lymphs Abs 0.7 0.7 - 4.0 K/uL   Monocytes Relative 9 %   Monocytes Absolute 1.8 (H) 0.1 - 1.0 K/uL   Eosinophils Relative 1 %   Eosinophils Absolute 0.1 0.0 - 0.5 K/uL   Basophils Relative 0 %   Basophils Absolute 0.1 0.0 - 0.1 K/uL   Immature Granulocytes 1 %   Abs Immature Granulocytes 0.14 (H) 0.00 - 0.07 K/uL    Comment: Performed at [facility] [address], [place], [place] [address]  Protime-INR     Status: None   Collection Time: 02/20/21  5:24 PM  Result Value Ref Range   Prothrombin Time 14.9 11.4 - 15.2 seconds   INR 1.2 0.8 - 1.2    Comment: (NOTE) INR goal varies based on device and disease states. Performed at [facility], [place] [address] [place], [place] [address]   APTT     Status: None   Collection Time: 02/20/21  5:24 PM  Result Value Ref Range   aPTT 29 24 - 36 seconds    Comment: Performed at [facility] [address] [place], [place] [address]  Lipase, blood     Status: None   Collection Time: 02/20/21  5:24 PM  Result Value Ref Range   Lipase 25 11 - 51 U/L    Comment: Performed at [facility] [address] [place], [place] [address]  Magnesium     Status: None   Collection Time: 02/20/21  5:24 PM  Result Value Ref Range   Magnesium 1.8 1.7 - 2.4 mg/dL    Comment: Performed at [facility], [place] [address], [place],  [address]  I-Stat beta hCG  blood, ED     Status: None   Collection Time: 02/20/21  5:54 PM  Result Value Ref Range   I-stat hCG, quantitative <5.0 <5 mIU/mL   Comment 3            Comment:   GEST. AGE      CONC.  (mIU/mL)   <=1 WEEK        5 - 50     2 WEEKS       50 - 500     3 WEEKS       100 - 10,000     4 WEEKS     1,000 - 30,000        FEMALE AND NON-PREGNANT FEMALE:     LESS THAN 5 mIU/mL   Resp Panel by RT-PCR (Flu A&B, Covid) Nasopharyngeal Swab     Status: None   Collection Time: 02/20/21  6:21 PM   Specimen: Nasopharyngeal Swab; Nasopharyngeal(NP) swabs in vial transport medium  Result Value Ref Range   SARS Coronavirus 2 by RT PCR NEGATIVE NEGATIVE    Comment: (NOTE) SARS-CoV-2 target nucleic acids are NOT DETECTED.  The SARS-CoV-2 RNA is generally detectable in upper respiratory specimens during the acute phase of infection. The lowest concentration of SARS-CoV-2 viral copies this assay can detect is 138 copies/mL. A negative result does not preclude SARS-Cov-2 infection and should not be used as the sole basis for treatment or other patient management decisions. A negative result may occur with  improper specimen collection/handling, submission of specimen other than nasopharyngeal swab, presence of viral mutation(s) within the areas targeted by this assay, and inadequate number of viral copies(<138 copies/mL). A negative result must be combined with clinical observations, patient history, and epidemiological information. The expected result is Negative.  Fact Sheet for Patients:  EntrepreneurPulse.com.au  Fact Sheet for Healthcare Providers:  IncredibleEmployment.be  This test is no t yet approved or cleared by the [place] FDA and  has been authorized for detection and/or diagnosis of SARS-CoV-2 by FDA under an Emergency Use Authorization (EUA). This EUA will remain  in effect (meaning this test can be used) for the duration of the COVID-19  declaration under Section 564(b)(1) of the Act, 21 U.S.C.section 360bbb-3(b)(1), unless the authorization is terminated  or revoked sooner.       Influenza A by PCR NEGATIVE NEGATIVE   Influenza B by PCR NEGATIVE NEGATIVE    Comment: (NOTE) The Xpert Xpress SARS-CoV-2/FLU/RSV plus assay is intended as an aid in the diagnosis of influenza from Nasopharyngeal swab specimens and should not be used as a sole basis for treatment. Nasal washings and aspirates are unacceptable for Xpert Xpress SARS-CoV-2/FLU/RSV testing.  Fact Sheet for Patients: EntrepreneurPulse.com.au  Fact Sheet for Healthcare Providers: IncredibleEmployment.be  This test is not yet approved or cleared by the [place] FDA and has been authorized for detection and/or diagnosis of SARS-CoV-2 by FDA under an Emergency Use Authorization (EUA). This EUA will remain in effect (meaning this test can be used) for the duration of the COVID-19 declaration under Section 564(b)(1) of the Act, 21 U.S.C. section 360bbb-3(b)(1), unless the authorization is terminated or revoked.  Performed at [facility], [place] [address] [place], [place] [address]   Urinalysis, Routine w reflex microscopic Urine, Clean Catch     Status: Abnormal   Collection Time: 02/20/21  7:31 PM  Result Value Ref Range   Color, Urine YELLOW YELLOW   APPearance CLEAR CLEAR   Specific Gravity, Urine 1.042 (H) 1.005 - 1.030   pH 6.0 5.0 - 8.0   Glucose, UA NEGATIVE  NEGATIVE mg/dL   Hgb urine dipstick NEGATIVE NEGATIVE   Bilirubin Urine NEGATIVE NEGATIVE   Ketones, ur 80 (A) NEGATIVE mg/dL   Protein, ur NEGATIVE NEGATIVE mg/dL   Nitrite NEGATIVE NEGATIVE   Leukocytes,Ua NEGATIVE NEGATIVE    Comment: Performed at [facility], [address] [place], [place] [address]  Lactic acid, plasma     Status: None   Collection Time: 02/20/21  8:36 PM  Result Value Ref Range   Lactic Acid, Venous 1.5 0.5 - 1.9  mmol/L    Comment: Performed at [facility], [address] [place], [place] [address]  Creatinine, serum     Status: None   Collection Time: 02/21/21  1:15 AM  Result Value Ref Range   Creatinine, Ser 0.73 0.44 - 1.00 mg/dL   GFR, Estimated >28>60 >41>60 mL/min    Comment: (NOTE) Calculated using the CKD-EPI Creatinine Equation (2021) Performed at [facility], [address] [place], [place] [address]   Basic metabolic panel     Status: Abnormal   Collection Time: 02/21/21  1:15 AM  Result Value Ref Range   Sodium 133 (L) 135 - 145 mmol/L   Potassium 3.6 3.5 - 5.1 mmol/L   Chloride 102 98 - 111 mmol/L   CO2 23 22 - 32 mmol/L   Glucose, Bld 137 (H) 70 - 99 mg/dL    Comment: Glucose reference range applies only to samples taken after fasting for at least 8 hours.   BUN 6 6 - 20 mg/dL   Creatinine, Ser 1.020.73 0.44 - 1.00 mg/dL   Calcium 8.0 (L) 8.9 - 10.3 mg/dL   GFR, Estimated >72>60 >53>60 mL/min    Comment: (NOTE) Calculated using the CKD-EPI Creatinine Equation (2021)    Anion gap 8 5 - 15    Comment: Performed at [facility], [address] [place], [place] [address]  CBC     Status: Abnormal   Collection Time: 02/21/21  1:15 AM  Result Value Ref Range   WBC 14.1 (H) 4.0 - 10.5 K/uL   RBC 4.19 3.87 - 5.11 MIL/uL   Hemoglobin 12.6 12.0 - 15.0 g/dL   HCT 34.737.0 42.536.0 - 95.646.0 %   MCV 88.3 80.0 - 100.0 fL   MCH 30.1 26.0 - 34.0 pg   MCHC 34.1 30.0 - 36.0 g/dL   RDW 38.712.7 56.411.5 - 33.215.5 %   Platelets 200 150 - 400 K/uL   nRBC 0.0 0.0 - 0.2 %    Comment: Performed at [facility], [address] [place], [place] [address]    Imaging / Studies: CT ABDOMEN PELVIS W CONTRAST  Result Date: 02/20/2021 CLINICAL DATA:  Right lower quadrant abdominal pain x2 days, nausea/vomiting EXAM: CT ABDOMEN AND PELVIS WITH CONTRAST TECHNIQUE: Multidetector CT imaging of the abdomen and pelvis was performed using the standard protocol following bolus administration of intravenous  contrast. CONTRAST:  100mL OMNIPAQUE IOHEXOL 300 MG/ML  SOLN COMPARISON:  None. FINDINGS: Lower chest: Lung bases are clear. Hepatobiliary: Liver is within normal limits, noting focal fat/altered perfusion along the falciform ligament. Gallbladder is unremarkable. No intrahepatic or extrahepatic ductal dilatation. Pancreas: Within normal limits. Spleen: Within normal limits. Adrenals/Urinary Tract: Adrenal glands are within normal limits. Kidneys are within normal limits.  No hydronephrosis. Bladder is within normal limits. Stomach/Bowel: Stomach is within normal limits. No evidence of bowel obstruction. Abnormal appendix, with focal dilatation measuring up to 12 mm distally (coronal image 40), and a  suspected proximal appendicolith (coronal image 32), suspicious for acute appendicitis. Adjacent localized, irregular extraluminal gas (coronal image 35) raises concern for focal perforation. No drainable fluid collection/abscess. Vascular/Lymphatic: No evidence of abdominal aortic aneurysm. No suspicious abdominopelvic lymphadenopathy. Reproductive: Uterus is notable for an IUD in satisfactory position. Bilateral ovaries are within normal limits. Other: No abdominopelvic ascites. No free air. Musculoskeletal: Visualized osseous structures are within normal limits. IMPRESSION: Suspected acute appendicitis with adjacent extraluminal gas, raising concern for focal perforation. No drainable fluid collection/abscess. No free air. Electronically Signed   By: Charline Bills M.D.   On: 02/20/2021 19:21    Medications / Allergies: per chart  Antibiotics: Anti-infectives (From admission, onward)    Start     Dose/Rate Route Frequency Ordered Stop   02/21/21 0400  piperacillin-tazobactam (ZOSYN) IVPB 3.375 g        3.375 g 12.5 mL/hr over 240 Minutes Intravenous Every 8 hours 02/20/21 2037 02/26/21 0359   02/20/21 2015  piperacillin-tazobactam (ZOSYN) IVPB 3.375 g        3.375 g 100 mL/hr over 30 Minutes  Intravenous  Once 02/20/21 2001 02/20/21 2102         Note: Portions of this report may have been transcribed using voice recognition software. Every effort was made to ensure accuracy; however, inadvertent computerized transcription errors may be present.   Any transcriptional errors that result from this process are unintentional.    Ardeth Sportsman, MD, FACS, MASCRS Esophageal, Gastrointestinal & Colorectal Surgery Robotic and Minimally Invasive Surgery  [facility]  [facility]  [address] [place], [place] [phone] Fax [phone] Main  CONTACT INFORMATION:  Weekday (9AM-5PM): Call [facility] main office at [phone]  Weeknight (5PM-9AM) or Weekend/Holiday: Check [email or web address] (password " [email or web address]") for General Surgery [facility] coverage  (Please, do not use SecureChat as it is not reliable communication to operating surgeons for immediate patient care)      02/21/2021  7:56 AM

## 2021-02-22 ENCOUNTER — Encounter (HOSPITAL_COMMUNITY): Payer: Self-pay | Admitting: Surgery

## 2021-02-22 LAB — BASIC METABOLIC PANEL
Anion gap: 8 (ref 5–15)
BUN: 6 mg/dL (ref 6–20)
CO2: 30 mmol/L (ref 22–32)
Calcium: 8.6 mg/dL — ABNORMAL LOW (ref 8.9–10.3)
Chloride: 99 mmol/L (ref 98–111)
Creatinine, Ser: 0.84 mg/dL (ref 0.44–1.00)
GFR, Estimated: >60 mL/min (ref >60)
Glucose, Bld: 112 mg/dL — ABNORMAL HIGH (ref 70–99)
Potassium: 3.5 mmol/L (ref 3.5–5.1)
Sodium: 137 mmol/L (ref 135–145)

## 2021-02-22 LAB — CBC
HCT: 37.3 % (ref 36.0–46.0)
Hemoglobin: 12.5 g/dL (ref 12.0–15.0)
MCH: 29.5 pg (ref 26.0–34.0)
MCHC: 33.5 g/dL (ref 30.0–36.0)
MCV: 88 fL (ref 80.0–100.0)
Platelets: 222 10*3/uL (ref 150–400)
RBC: 4.24 MIL/uL (ref 3.87–5.11)
RDW: 12.8 % (ref 11.5–15.5)
WBC: 14.2 10*3/uL — ABNORMAL HIGH (ref 4.0–10.5)
nRBC: 0 % (ref 0.0–0.2)

## 2021-02-22 MED ORDER — ALBUTEROL SULFATE (2.5 MG/3ML) 0.083% IN NEBU: 2.5000 mg | INHALATION_SOLUTION | RESPIRATORY_TRACT | Status: DC | PRN

## 2021-02-22 MED ORDER — ONDANSETRON HCL 4 MG/2ML IJ SOLN
4.0000 mg | INTRAMUSCULAR | Status: DC | PRN
  Administered 2021-02-22: 4 mg via INTRAVENOUS
  Filled 2021-02-22: qty 2

## 2021-02-22 MED ORDER — METHYLPREDNISOLONE SODIUM SUCC 125 MG IJ SOLR
60.0000 mg | Freq: Once | INTRAMUSCULAR | Status: AC
  Administered 2021-02-22: 60 mg via INTRAVENOUS
  Filled 2021-02-22: qty 2

## 2021-02-22 MED ORDER — RACEPINEPHRINE HCL 2.25 % IN NEBU
0.5000 mL | INHALATION_SOLUTION | Freq: Once | RESPIRATORY_TRACT | Status: AC
  Administered 2021-02-22: 0.5 mL via RESPIRATORY_TRACT
  Filled 2021-02-22: qty 0.5

## 2021-02-22 MED ORDER — RACEPINEPHRINE HCL 2.25 % IN NEBU
0.5000 mL | INHALATION_SOLUTION | RESPIRATORY_TRACT | Status: DC | PRN
  Filled 2021-02-22: qty 0.5

## 2021-02-22 NOTE — Progress Notes (Signed)
Notified by nurse of patient having allergic reaction after dose of zofran. Patient with rash on hands and legs b/l, as well as reported dyspnea and has used albuterol inhaler at bedside. Patient examined by me, with raised red rash on hands extending to just above b/l wrists and b/l LE. Patient reports rash on hands has slightly improved. Has rec'd benadryl. Reports dyspnea has not significantly improved after use of albuterol. Will add zofran as allergy, order d/c'd. Racemic epi ordered to be given now with additional prn doses available. Single dose of solu-medrol given, 1mg /kg. Continuous pulse-ox ordered. Benadryl available prn. Requested patient to be moved to a room closer to the nurses station, even if that means moving another patient out of a room so she can be closer. Patient was actively pumping at the time of my exam. Patient counseled to label this breastmilk and to not yet discard. Discussed with RPh who also discussed with staff at [facility] and patient reportedly okay to use this milk for her 76m old child.   11m, MD General and Trauma Surgery [facility]

## 2021-02-22 NOTE — Discharge Instructions (Signed)
[facility] ° °Please arrive at least 30 min before your appointment to complete your check in paperwork.  If you are unable to arrive 30 min prior to your appointment time we may have to cancel or reschedule you. °LAPAROSCOPIC SURGERY: POST OP INSTRUCTIONS °Always review your discharge instruction sheet given to you by the facility where your surgery was performed. °IF YOU HAVE DISABILITY OR FAMILY LEAVE FORMS, YOU MUST BRING THEM TO THE OFFICE FOR PROCESSING.   °DO NOT GIVE THEM TO YOUR DOCTOR. ° °PAIN CONTROL ° °First take acetaminophen (Tylenol) AND/or ibuprofen (Advil) to control your pain after surgery.  Follow directions on package.  Taking acetaminophen (Tylenol) and/or ibuprofen (Advil) regularly after surgery will help to control your pain and lower the amount of prescription pain medication you may need.  You should not take more than 4,000 mg (4 grams) of acetaminophen (Tylenol) in 24 hours.  You should not take ibuprofen (Advil), aleve, motrin, naprosyn or other NSAIDS if you have a history of stomach ulcers or chronic kidney disease.  °A prescription for pain medication may be given to you upon discharge.  Take your pain medication as prescribed, if you still have uncontrolled pain after taking acetaminophen (Tylenol) or ibuprofen (Advil). °Use ice packs to help control pain. °If you need a refill on your pain medication, please contact your pharmacy.  They will contact our office to request authorization. Prescriptions will not be filled after 5pm or on week-ends. ° °HOME MEDICATIONS °Take your usually prescribed medications unless otherwise directed. ° °DIET °You should follow a light diet the first few days after arrival home.  Be sure to include lots of fluids daily. Avoid fatty, fried foods.  ° °CONSTIPATION °It is common to experience some constipation after surgery and if you are taking pain medication.  Increasing fluid intake and taking a stool softener (such as Colace)  will usually help or prevent this problem from occurring.  A mild laxative (Milk of Magnesia or Miralax) should be taken according to package instructions if there are no bowel movements after 48 hours. ° °WOUND/INCISION CARE °Most patients will experience some swelling and bruising in the area of the incisions.  Ice packs will help.  Swelling and bruising can take several days to resolve.  °Unless discharge instructions indicate otherwise, follow guidelines below  °STERI-STRIPS - you may remove your outer bandages 48 hours after surgery, and you may shower at that time.  You have steri-strips (small skin tapes) in place directly over the incision.  These strips should be left on the skin for 7-10 days.   °DERMABOND/SKIN GLUE - you may shower in 24 hours.  The glue will flake off over the next 2-3 weeks. °Any sutures or staples will be removed at the office during your follow-up visit. ° °ACTIVITIES °You may resume regular (light) daily activities beginning the next day--such as daily self-care, walking, climbing stairs--gradually increasing activities as tolerated.  You may have sexual intercourse when it is comfortable.  Refrain from any heavy lifting or straining until approved by your doctor. °You may drive when you are no longer taking prescription pain medication, you can comfortably wear a seatbelt, and you can safely maneuver your car and apply brakes. ° °FOLLOW-UP °You should see your doctor in the office for a follow-up appointment approximately 2-3 weeks after your surgery.  You should have been given your post-op/follow-up appointment when your surgery was scheduled.  If you did not receive a post-op/follow-up appointment, make sure   that you call for this appointment within a day or two after you arrive home to insure a convenient appointment time. ° ° °WHEN TO CALL YOUR DOCTOR: °Fever over 101.0 °Inability to urinate °Continued bleeding from incision. °Increased pain, redness, or drainage from the  incision. °Increasing abdominal pain ° °The clinic staff is available to answer your questions during regular business hours.  Please don’t hesitate to call and ask to speak to one of the nurses for clinical concerns.  If you have a medical emergency, go to the nearest emergency room or call 911.  A surgeon from [facility] is always on call at the hospital. °[address], [address], [place], [place]  [address] ? [address] [address], , [place]   [address] °[phone] ? [phone] ? FAX [phone] ° ° ° ° °Managing Your Pain After Surgery Without Opioids ° ° ° °Thank you for participating in our program to help patients manage their pain after surgery without opioids. This is part of our effort to provide you with the best care possible, without exposing you or your family to the risk that opioids pose. ° °What pain can I expect after surgery? °You can expect to have some pain after surgery. This is normal. The pain is typically worse the day after surgery, and quickly begins to get better. °Many studies have found that many patients are able to manage their pain after surgery with Over-the-Counter (OTC) medications such as Tylenol and Motrin. If you have a condition that does not allow you to take Tylenol or Motrin, notify your surgical team. ° °How will I manage my pain? °The best strategy for controlling your pain after surgery is around the clock pain control with Tylenol (acetaminophen) and Motrin (ibuprofen or Advil). Alternating these medications with each other allows you to maximize your pain control. In addition to Tylenol and Motrin, you can use heating pads or ice packs on your incisions to help reduce your pain. ° °How will I alternate your regular strength over-the-counter pain medication? °You will take a dose of pain medication every three hours. °Start by taking 650 mg of Tylenol (2 pills of 325 mg) °3 hours later take 600 mg of Motrin (3 pills of 200 mg) °3 hours after  taking the Motrin take 650 mg of Tylenol °3 hours after that take 600 mg of Motrin. ° ° °- 1 - ° °See example - if your first dose of Tylenol is at 12:00 PM ° ° °12:00 PM Tylenol 650 mg (2 pills of 325 mg)  °3:00 PM Motrin 600 mg (3 pills of 200 mg)  °6:00 PM Tylenol 650 mg (2 pills of 325 mg)  °9:00 PM Motrin 600 mg (3 pills of 200 mg)  °Continue alternating every 3 hours  ° °We recommend that you follow this schedule around-the-clock for at least 3 days after surgery, or until you feel that it is no longer needed. Use the table on the last page of this handout to keep track of the medications you are taking. °Important: °Do not take more than 3000mg of Tylenol or 3200mg of Motrin in a 24-hour period. °Do not take ibuprofen/Motrin if you have a history of bleeding stomach ulcers, severe kidney disease, &/or actively taking a blood thinner ° °What if I still have pain? °If you have pain that is not controlled with the over-the-counter pain medications (Tylenol and Motrin or Advil) you might have what we call “breakthrough” pain. You will receive a prescription   for a small amount of an opioid pain medication such as Oxycodone, Tramadol, or Tylenol with Codeine. Use these opioid pills in the first 24 hours after surgery if you have breakthrough pain. Do not take more than 1 pill every 4-6 hours. ° °If you still have uncontrolled pain after using all opioid pills, don't hesitate to call our staff using the number provided. We will help make sure you are managing your pain in the best way possible, and if necessary, we can provide a prescription for additional pain medication. ° ° °Day 1   ° °Time  °Name of Medication Number of pills taken  °Amount of Acetaminophen  °Pain Level  ° °Comments  °AM PM       °AM PM       °AM PM       °AM PM       °AM PM       °AM PM       °AM PM       °AM PM       °Total Daily amount of Acetaminophen °Do not take more than  3,000 mg per day    ° ° °Day 2   ° °Time  °Name of Medication  Number of pills °taken  °Amount of Acetaminophen  °Pain Level  ° °Comments  °AM PM       °AM PM       °AM PM       °AM PM       °AM PM       °AM PM       °AM PM       °AM PM       °Total Daily amount of Acetaminophen °Do not take more than  3,000 mg per day    ° ° °Day 3   ° °Time  °Name of Medication Number of pills taken  °Amount of Acetaminophen  °Pain Level  ° °Comments  °AM PM       °AM PM       °AM PM       °AM PM       ° ° ° °AM PM       °AM PM       °AM PM       °AM PM       °Total Daily amount of Acetaminophen °Do not take more than  3,000 mg per day    ° ° °Day 4   ° °Time  °Name of Medication Number of pills taken  °Amount of Acetaminophen  °Pain Level  ° °Comments  °AM PM       °AM PM       °AM PM       °AM PM       °AM PM       °AM PM       °AM PM       °AM PM       °Total Daily amount of Acetaminophen °Do not take more than  3,000 mg per day    ° ° °Day 5   ° °Time  °Name of Medication Number °of pills taken  °Amount of Acetaminophen  °Pain Level  ° °Comments  °AM PM       °AM PM       °AM PM       °AM PM       °AM PM       °AM   PM       °AM PM       °AM PM       °Total Daily amount of Acetaminophen °Do not take more than  3,000 mg per day    ° ° ° °Day 6   ° °Time  °Name of Medication Number of pills °taken  °Amount of Acetaminophen  °Pain Level  °Comments  °AM PM       °AM PM       °AM PM       °AM PM       °AM PM       °AM PM       °AM PM       °AM PM       °Total Daily amount of Acetaminophen °Do not take more than  3,000 mg per day    ° ° °Day 7   ° °Time  °Name of Medication Number of pills taken  °Amount of Acetaminophen  °Pain Level  ° °Comments  °AM PM       °AM PM       °AM PM       °AM PM       °AM PM       °AM PM       °AM PM       °AM PM       °Total Daily amount of Acetaminophen °Do not take more than  3,000 mg per day    ° ° ° ° °For additional information about how and where to safely dispose of unused opioid °medications - https://www.morepowerfulnc.org ° °Disclaimer: This document  contains information and/or instructional materials adapted from [facility] for the typical patient with your condition. It does not replace medical advice from your health care provider because your experience may differ from that of the °typical patient. Talk to your health care provider if you have any questions about this °document, your condition or your treatment plan. °Adapted from [facility] ° °

## 2021-02-22 NOTE — Progress Notes (Signed)
2 Days Post-Op  Subjective: Patient has some nausea and small amount of clear emesis this morning.   No flatus or BM yet.  Having pain, but seems well controlled.  Mobilizing some.    ROS: See above, otherwise other systems negative  Objective: Vital signs in last 24 hours: Temp:  [98 F (36.7 C)-98.8 F (37.1 C)] 98.2 F (36.8 C) (12/12 0731) Pulse Rate:  [95-103] 95 (12/12 0731) Resp:  [16-20] 20 (12/12 0731) BP: (101-122)/(64-77) 122/77 (12/12 0731) SpO2:  [96 %-100 %] 97 % (12/12 0731) Last BM Date: 02/20/21  Intake/Output from previous day: 12/11 0701 - 12/12 0700 In: 100 [IV Piggyback:100] Out: 150 [Drains:150] Intake/Output this shift: Total I/O In: 50 [IV Piggyback:50] Out: -   PE: Abd: soft, appropriately tender, absent BS, but not really distended, incisions are c/d/I, JP drain with serosang output.  Lab Results:  Recent Labs    02/21/21 0115 02/22/21 0220  WBC 14.1* 14.2*  HGB 12.6 12.5  HCT 37.0 37.3  PLT 200 222   BMET Recent Labs    02/21/21 0115 02/22/21 0220  NA 133* 137  K 3.6 3.5  CL 102 99  CO2 23 30  GLUCOSE 137* 112*  BUN 6 6  CREATININE 0.73  0.73 0.84  CALCIUM 8.0* 8.6*   PT/INR Recent Labs    02/20/21 1724  LABPROT 14.9  INR 1.2   CMP     Component Value Date/Time   NA 137 02/22/2021 0220   NA 142 11/12/2020 1028   K 3.5 02/22/2021 0220   CL 99 02/22/2021 0220   CO2 30 02/22/2021 0220   GLUCOSE 112 (H) 02/22/2021 0220   BUN 6 02/22/2021 0220   BUN 9 11/12/2020 1028   CREATININE 0.84 02/22/2021 0220   CALCIUM 8.6 (L) 02/22/2021 0220   PROT 7.7 02/20/2021 1724   PROT 7.2 11/12/2020 1028   ALBUMIN 4.0 02/20/2021 1724   ALBUMIN 4.8 11/12/2020 1028   AST 18 02/20/2021 1724   ALT 18 02/20/2021 1724   ALKPHOS 55 02/20/2021 1724   BILITOT 1.2 02/20/2021 1724   BILITOT 0.3 11/12/2020 1028   GFRNONAA >60 02/22/2021 0220   GFRAA >60 11/08/2019 1243   Lipase     Component Value Date/Time   LIPASE 25 02/20/2021  1724       Studies/Results: CT ABDOMEN PELVIS W CONTRAST  Result Date: 02/20/2021 CLINICAL DATA:  Right lower quadrant abdominal pain x2 days, nausea/vomiting EXAM: CT ABDOMEN AND PELVIS WITH CONTRAST TECHNIQUE: Multidetector CT imaging of the abdomen and pelvis was performed using the standard protocol following bolus administration of intravenous contrast. CONTRAST:  OMNIPAQUE IOHEXOL 300 MG/ML  SOLN COMPARISON:  None. FINDINGS: Lower chest: Lung bases are clear. Hepatobiliary: Liver is within normal limits, noting focal fat/altered perfusion along the falciform ligament. Gallbladder is unremarkable. No intrahepatic or extrahepatic ductal dilatation. Pancreas: Within normal limits. Spleen: Within normal limits. Adrenals/Urinary Tract: Adrenal glands are within normal limits. Kidneys are within normal limits.  No hydronephrosis. Bladder is within normal limits. Stomach/Bowel: Stomach is within normal limits. No evidence of bowel obstruction. Abnormal appendix, with focal dilatation measuring up to 12 mm distally (coronal image 40), and a suspected proximal appendicolith (coronal image 32), suspicious for acute appendicitis. Adjacent localized, irregular extraluminal gas (coronal image 35) raises concern for focal perforation. No drainable fluid collection/abscess. Vascular/Lymphatic: No evidence of abdominal aortic aneurysm. No suspicious abdominopelvic lymphadenopathy. Reproductive: Uterus is notable for an IUD in satisfactory position. Bilateral ovaries are within  normal limits. Other: No abdominopelvic ascites. No free air. Musculoskeletal: Visualized osseous structures are within normal limits. IMPRESSION: Suspected acute appendicitis with adjacent extraluminal gas, raising concern for focal perforation. No drainable fluid collection/abscess. No free air. Electronically Signed   By: Charline Bills M.D.   On: 02/20/2021 19:21    Anti-infectives: Anti-infectives (From admission, onward)     Start     Dose/Rate Route Frequency Ordered Stop   02/21/21 0400  piperacillin-tazobactam (ZOSYN) IVPB 3.375 g        3.375 g 12.5 mL/hr over 240 Minutes Intravenous Every 8 hours 02/20/21 2037 02/26/21 0759   02/20/21 2015  piperacillin-tazobactam (ZOSYN) IVPB 3.375 g        3.375 g 100 mL/hr over 30 Minutes Intravenous  Once 02/20/21 2001 02/20/21 2102        Assessment/Plan POD 2, s/p lap appy for perforated appendicitis, Dr. Dossie Der 02/20/21 -mild post op ileus -decrease to FLD -mobilize, pulm toilet -cont abx therapy for 5 days total -multi-modal pain control   FEN - FLD, IVFs VTE - Lovenox  ID - zosyn 12/10-->  Breast feeding   LOS: 2 days    Letha Cape , [facility] 02/22/2021, 9:47 AM Please see Amion for pager number during day hours 7:00am-4:30pm or 7:00am -11:30am on weekends

## 2021-02-22 NOTE — Progress Notes (Signed)
  Transition of Care [facility]) Screening Note   Patient Details  Name: Eileen Abbott Date of Birth: Jul 31, 1990   Transition of Care [facility]) CM/SW Contact:    Kermit Balo, RN Phone Number: 02/22/2021, 3:09 PM    Transition of Care Department [facility]) has reviewed patient. We will continue to monitor patient advancement through interdisciplinary progression rounds. If new patient transition needs arise, please place a TOC consult.

## 2021-02-23 LAB — SURGICAL PATHOLOGY

## 2021-02-23 LAB — BASIC METABOLIC PANEL
Anion gap: 8 (ref 5–15)
BUN: 6 mg/dL (ref 6–20)
CO2: 29 mmol/L (ref 22–32)
Calcium: 8.6 mg/dL — ABNORMAL LOW (ref 8.9–10.3)
Chloride: 97 mmol/L — ABNORMAL LOW (ref 98–111)
Creatinine, Ser: 0.65 mg/dL (ref 0.44–1.00)
GFR, Estimated: >60 mL/min (ref >60)
Glucose, Bld: 155 mg/dL — ABNORMAL HIGH (ref 70–99)
Potassium: 3.6 mmol/L (ref 3.5–5.1)
Sodium: 134 mmol/L — ABNORMAL LOW (ref 135–145)

## 2021-02-23 LAB — CBC
HCT: 36.8 % (ref 36.0–46.0)
Hemoglobin: 12.5 g/dL (ref 12.0–15.0)
MCH: 29.8 pg (ref 26.0–34.0)
MCHC: 34 g/dL (ref 30.0–36.0)
MCV: 87.6 fL (ref 80.0–100.0)
Platelets: 268 10*3/uL (ref 150–400)
RBC: 4.2 MIL/uL (ref 3.87–5.11)
RDW: 12.5 % (ref 11.5–15.5)
WBC: 8.2 10*3/uL (ref 4.0–10.5)
nRBC: 0 % (ref 0.0–0.2)

## 2021-02-23 MED ORDER — INFLUENZA VAC SUBUNIT QUAD 0.5 ML IM SUSY
0.5000 mL | PREFILLED_SYRINGE | INTRAMUSCULAR | Status: DC
  Filled 2021-02-23: qty 0.5

## 2021-02-23 NOTE — Anesthesia Postprocedure Evaluation (Signed)
Anesthesia Post Note  Patient: Eileen Abbott  Procedure(s) Performed: APPENDECTOMY LAPAROSCOPIC     Patient location during evaluation: PACU Anesthesia Type: General Level of consciousness: awake and alert Pain management: pain level controlled Vital Signs Assessment: post-procedure vital signs reviewed and stable Respiratory status: spontaneous breathing, nonlabored ventilation, respiratory function stable and patient connected to nasal cannula oxygen Cardiovascular status: blood pressure returned to baseline and stable Postop Assessment: no apparent nausea or vomiting Anesthetic complications: no   No notable events documented.  Last Vitals:  Vitals:   02/23/21 0733 02/23/21 1106  BP: 98/66 103/67  Pulse: 76 76  Resp: 14 14  Temp: 36.7 C 37.1 C  SpO2: 98% 98%    Last Pain:  Vitals:   02/23/21 1106  TempSrc: Oral  PainSc:                  Eileen Abbott

## 2021-02-23 NOTE — Plan of Care (Signed)

## 2021-02-23 NOTE — Progress Notes (Signed)
Patient began to have an allergic reaction to nausea medication Zofran. Patient was found to have hives and redness at bilateral hands and legs. Patient also reporting difficulty breathing. PRN medication for itching has been given and Home albuterol at bedside was used. MD made aware. Patient has Pulse Ox at 95%-97%. MD has placed orders. After nebulizer and IV steroids patient reports relief of dyspnea. RN has let charge know about the need to change room for a closer room to the nurses stations. RN has also passed this order to the following night shift RN as well.

## 2021-02-23 NOTE — Progress Notes (Signed)
° °  General Surgery Follow Up Note  Subjective:    Overnight Issues:   Objective:  Vital signs for last 24 hours: Temp:  [98 F (36.7 C)-98.8 F (37.1 C)] 98.8 F (37.1 C) (12/13 1106) Pulse Rate:  [68-93] 76 (12/13 1106) Resp:  [14-18] 14 (12/13 1106) BP: (94-113)/(57-67) 103/67 (12/13 1106) SpO2:  [97 %-98 %] 98 % (12/13 1106)  Hemodynamic parameters for last 24 hours:    Intake/Output from previous day: 12/12 0701 - 12/13 0700 In: 153.3 [IV Piggyback:153.3] Out: 175 [Drains:175]  Intake/Output this shift: No intake/output data recorded.  Vent settings for last 24 hours:    Physical Exam:  Gen: comfortable, no distress Neuro: non-focal exam HEENT: PERRL Neck: supple CV: RRR Pulm: unlabored breathing Abd: soft, NT, incisions c/d/I, drain serous GU: clear yellow urine Extr: wwp, no edema   Results for orders placed or performed during the hospital encounter of 02/20/21 (from the past 24 hour(s))  Basic metabolic panel     Status: Abnormal   Collection Time: 02/23/21  2:46 AM  Result Value Ref Range   Sodium 134 (L) 135 - 145 mmol/L   Potassium 3.6 3.5 - 5.1 mmol/L   Chloride 97 (L) 98 - 111 mmol/L   CO2 29 22 - 32 mmol/L   Glucose, Bld 155 (H) 70 - 99 mg/dL   BUN 6 6 - 20 mg/dL   Creatinine, Ser 2.95 0.44 - 1.00 mg/dL   Calcium 8.6 (L) 8.9 - 10.3 mg/dL   GFR, Estimated >18 >84 mL/min   Anion gap 8 5 - 15  CBC     Status: None   Collection Time: 02/23/21  2:46 AM  Result Value Ref Range   WBC 8.2 4.0 - 10.5 K/uL   RBC 4.20 3.87 - 5.11 MIL/uL   Hemoglobin 12.5 12.0 - 15.0 g/dL   HCT 16.6 06.3 - 01.6 %   MCV 87.6 80.0 - 100.0 fL   MCH 29.8 26.0 - 34.0 pg   MCHC 34.0 30.0 - 36.0 g/dL   RDW 01.0 93.2 - 35.5 %   Platelets 268 150 - 400 K/uL   nRBC 0.0 0.0 - 0.2 %    Assessment & Plan: The plan of care was discussed with the bedside nurse for the day, who is in agreement with this plan and no additional concerns were raised.   Present on Admission:   Acute perforated appendicitis    LOS: 3 days   Additional comments:I reviewed the patient's new clinical lab test results.   and I reviewed the patients new imaging test results.    POD3 s/p lap appy for perforated appendicitis, Dr. Dossie Der 02/20/21 -mild post op ileus, resolving - FLD -mobilize, pulm toilet -cont abx therapy for 5 days total -multi-modal pain control  Medication reaction to zofran - rash and dyspnea, treated as anaphylactic reaction, given racemic x1 and solumedrol with resolution.    FEN - FLD, IVFs VTE - Lovenox  ID - zosyn 12/10-->   Breast feeding  Diamantina Monks, MD Trauma & General Surgery Please use [email or web address] to contact on call provider  02/23/2021  *Care during the described time interval was provided by me. I have reviewed this patient's available data, including medical history, events of note, physical examination and test results as part of my evaluation.

## 2021-02-24 LAB — BASIC METABOLIC PANEL
Anion gap: 8 (ref 5–15)
BUN: 10 mg/dL (ref 6–20)
CO2: 30 mmol/L (ref 22–32)
Calcium: 8.3 mg/dL — ABNORMAL LOW (ref 8.9–10.3)
Chloride: 97 mmol/L — ABNORMAL LOW (ref 98–111)
Creatinine, Ser: 0.81 mg/dL (ref 0.44–1.00)
GFR, Estimated: >60 mL/min (ref >60)
Glucose, Bld: 89 mg/dL (ref 70–99)
Potassium: 2.9 mmol/L — ABNORMAL LOW (ref 3.5–5.1)
Sodium: 135 mmol/L (ref 135–145)

## 2021-02-24 LAB — CBC
HCT: 37.4 % (ref 36.0–46.0)
Hemoglobin: 12.4 g/dL (ref 12.0–15.0)
MCH: 29.6 pg (ref 26.0–34.0)
MCHC: 33.2 g/dL (ref 30.0–36.0)
MCV: 89.3 fL (ref 80.0–100.0)
Platelets: 301 10*3/uL (ref 150–400)
RBC: 4.19 MIL/uL (ref 3.87–5.11)
RDW: 12.9 % (ref 11.5–15.5)
WBC: 9.8 10*3/uL (ref 4.0–10.5)
nRBC: 0 % (ref 0.0–0.2)

## 2021-02-24 MED ORDER — ACETAMINOPHEN 500 MG PO TABS: 1000.0000 mg | ORAL_TABLET | Freq: Four times a day (QID) | ORAL | 0 refills | Status: DC | PRN

## 2021-02-24 MED ORDER — OXYCODONE HCL 5 MG PO TABS: 5.0000 mg | ORAL_TABLET | Freq: Four times a day (QID) | ORAL | 0 refills | Status: DC | PRN

## 2021-02-24 MED ORDER — AMOXICILLIN-POT CLAVULANATE 875-125 MG PO TABS: 1.0000 | ORAL_TABLET | Freq: Two times a day (BID) | ORAL | 0 refills | Status: AC

## 2021-02-24 NOTE — Progress Notes (Signed)
Nursing dc note   Patient verbalized understanding of dc intstructions.surgical site unremarkable. Patient demonstrated how to care for jp drain. All belongings given to patient. Will transfer patient to dc lounge-father will be here in one hour.

## 2021-02-24 NOTE — Discharge Summary (Signed)
° ° °  Patient ID: Eileen Abbott [id number] Aug 30, 1990 30 y.o.  Admit date: 02/20/2021 Discharge date: 02/24/2021  Admitting Diagnosis: Acute perforated appendicitis Breastfeeding  Discharge Diagnosis Patient Active Problem List   Diagnosis Date Noted   Acute perforated appendicitis 02/20/2021   Gestational hypertension 07/15/2020   Vaginal delivery 07/15/2020   IUD (intrauterine device) in place 07/15/2020   Marginal insertion of umbilical cord affecting management of mother 02/19/2020   Genetic carrier status 01/22/2020   Fibromyalgia    Rh negative status during pregnancy 11/08/2019  S/p lap appy for perforated appendicitis  Consultants none  Reason for Admission: Eileen Abbott is an 30 y.o. female who is here for abdominal pain.  It has been going on for two days.  It is located in the right lower quadrant.  She had vomiting associated with the pain.  She had fevers the last two days as well.  She has a 45 month old son at home and is breastfeeding.  Procedures Lap appy, Dr. Dossie Der 02/20/21  Hospital Course:  The patient was admitted and placed on Zosyn and underwent the above procedure.  She tolerated this well.  She had a surgical drain placed at the time of surgery that remained serous during her stay.  This will stay in place and be removed in the office next week.  She remained on 5 days total of abx therapy.  She received 4 days in the hospital and will complete therapy with oral abx as an outpatient.  She developed a post op ileus but this resolved and her diet was advanced as tolerated.  She did have an anaphylactic reaction to zofran which was treated appropriately with no further complications.  On POD 3, she was stable for DC home with appropriate follow up made.  Physical Exam: Abd: soft, appropriately tender, incisions c/d/I, JP with minimal serous drainage, ND  Allergies as of 02/24/2021       Reactions   Zofran [ondansetron] Shortness Of Breath    Banana Nausea And Vomiting   Eggs Or Egg-derived Products Nausea And Vomiting        Medication List     TAKE these medications    acetaminophen 500 MG tablet Commonly known as: TYLENOL Take 2 tablets (1,000 mg total) by mouth every 6 (six) hours as needed.   amoxicillin-clavulanate 875-125 MG tablet Commonly known as: Augmentin Take 1 tablet by mouth 2 (two) times daily for 2 days.   oxyCODONE 5 MG immediate release tablet Commonly known as: Oxy IR/ROXICODONE Take 1 tablet (5 mg total) by mouth every 6 (six) hours as needed for moderate pain.          Follow-up Information     [facility] Follow up on 03/01/2021.   Specialty: General Surgery Why: 9:00am, This is a nurse only visit for drain removal.  Please arrive 30 minutes prior to your appointment for paperwork and check in process. Contact information: [address] [place] [address] [phone]         Hedda Slade, PA-C Follow up on 03/16/2021.   Specialty: General Surgery Why: 9:00am, please arrive 15 minutes early for check in process Contact information: [address] [place] [address] [phone]                 Signed: Barnetta Chapel, [facility] 02/24/2021, 8:52 AM Please see Amion for pager number during day hours 7:00am-4:30pm, 7-11:30am on Weekends

## 2021-02-24 NOTE — TOC Transition Note (Signed)
Transition of Care [facility]) - CM/SW Discharge Note   Patient Details  Name: Eileen Abbott MRN: [id number] Date of Birth: [birth year]  Transition of Care [facility]) CM/SW Contact:  Kermit Balo, RN Phone Number: 02/24/2021, 10:14 AM   Clinical Narrative:    Patient is discharging home with self care. No needs per TOC.   Final next level of care: Home/Self Care Barriers to Discharge: No Barriers Identified   Patient Goals and CMS Choice        Discharge Placement                       Discharge Plan and Services                                     Social Determinants of Health (SDOH) Interventions     Readmission Risk Interventions No flowsheet data found.

## 2021-02-25 ENCOUNTER — Telehealth: Payer: Self-pay

## 2021-02-25 LAB — CULTURE, BLOOD (ROUTINE X 2)
Culture: NO GROWTH
Special Requests: ADEQUATE

## 2021-02-25 NOTE — Telephone Encounter (Signed)
Transition Care Management Unsuccessful Follow-up Telephone Call ° °Date of discharge and from where:  02/24/2021 from [place] ° °Attempts:  1st Attempt ° °Reason for unsuccessful TCM follow-up call:  Left voice message ° ° ° °

## 2021-02-26 ENCOUNTER — Emergency Department (HOSPITAL_COMMUNITY)
Admission: EM | Admit: 2021-02-26 | Discharge: 2021-02-26 | Disposition: A | Discharge status: Home / Self Care | Payer: Medicaid Other | Attending: Emergency Medicine | Admitting: Emergency Medicine

## 2021-02-26 ENCOUNTER — Encounter (HOSPITAL_COMMUNITY): Payer: Self-pay

## 2021-02-26 ENCOUNTER — Emergency Department (HOSPITAL_COMMUNITY): Payer: Medicaid Other

## 2021-02-26 DIAGNOSIS — R109 Unspecified abdominal pain: Secondary | ICD-10-CM | POA: Diagnosis not present

## 2021-02-26 DIAGNOSIS — G8918 Other acute postprocedural pain: Secondary | ICD-10-CM | POA: Diagnosis not present

## 2021-02-26 DIAGNOSIS — Z9049 Acquired absence of other specified parts of digestive tract: Secondary | ICD-10-CM | POA: Diagnosis not present

## 2021-02-26 DIAGNOSIS — N9489 Other specified conditions associated with female genital organs and menstrual cycle: Secondary | ICD-10-CM | POA: Diagnosis not present

## 2021-02-26 DIAGNOSIS — Z87891 Personal history of nicotine dependence: Secondary | ICD-10-CM | POA: Insufficient documentation

## 2021-02-26 DIAGNOSIS — J45909 Unspecified asthma, uncomplicated: Secondary | ICD-10-CM | POA: Diagnosis not present

## 2021-02-26 DIAGNOSIS — R1031 Right lower quadrant pain: Secondary | ICD-10-CM | POA: Diagnosis not present

## 2021-02-26 LAB — CBC WITH DIFFERENTIAL/PLATELET
Abs Immature Granulocytes: 0.39 10*3/uL — ABNORMAL HIGH (ref 0.00–0.07)
Basophils Absolute: 0.1 10*3/uL (ref 0.0–0.1)
Basophils Relative: 0 %
Eosinophils Absolute: 0.6 10*3/uL — ABNORMAL HIGH (ref 0.0–0.5)
Eosinophils Relative: 4 %
HCT: 41.6 % (ref 36.0–46.0)
Hemoglobin: 13.4 g/dL (ref 12.0–15.0)
Immature Granulocytes: 3 %
Lymphocytes Relative: 21 %
Lymphs Abs: 2.8 10*3/uL (ref 0.7–4.0)
MCH: 29.5 pg (ref 26.0–34.0)
MCHC: 32.2 g/dL (ref 30.0–36.0)
MCV: 91.6 fL (ref 80.0–100.0)
Monocytes Absolute: 1 10*3/uL (ref 0.1–1.0)
Monocytes Relative: 8 %
Neutro Abs: 8.5 10*3/uL — ABNORMAL HIGH (ref 1.7–7.7)
Neutrophils Relative %: 64 %
Platelets: 469 10*3/uL — ABNORMAL HIGH (ref 150–400)
RBC: 4.54 MIL/uL (ref 3.87–5.11)
RDW: 12.8 % (ref 11.5–15.5)
WBC: 13.3 10*3/uL — ABNORMAL HIGH (ref 4.0–10.5)
nRBC: 0 % (ref 0.0–0.2)

## 2021-02-26 LAB — COMPREHENSIVE METABOLIC PANEL
ALT: 24 U/L (ref 0–44)
AST: 16 U/L (ref 15–41)
Albumin: 3 g/dL — ABNORMAL LOW (ref 3.5–5.0)
Alkaline Phosphatase: 67 U/L (ref 38–126)
Anion gap: 10 (ref 5–15)
BUN: 13 mg/dL (ref 6–20)
CO2: 26 mmol/L (ref 22–32)
Calcium: 8.9 mg/dL (ref 8.9–10.3)
Chloride: 99 mmol/L (ref 98–111)
Creatinine, Ser: 0.75 mg/dL (ref 0.44–1.00)
GFR, Estimated: >60 mL/min (ref >60)
Glucose, Bld: 106 mg/dL — ABNORMAL HIGH (ref 70–99)
Potassium: 3.7 mmol/L (ref 3.5–5.1)
Sodium: 135 mmol/L (ref 135–145)
Total Bilirubin: 0.4 mg/dL (ref 0.3–1.2)
Total Protein: 6.8 g/dL (ref 6.5–8.1)

## 2021-02-26 LAB — CULTURE, BLOOD (ROUTINE X 2): Culture: NO GROWTH

## 2021-02-26 LAB — LIPASE, BLOOD: Lipase: 64 U/L — ABNORMAL HIGH (ref 11–51)

## 2021-02-26 LAB — URINALYSIS, ROUTINE W REFLEX MICROSCOPIC
Bilirubin Urine: NEGATIVE
Glucose, UA: NEGATIVE mg/dL
Hgb urine dipstick: NEGATIVE
Ketones, ur: NEGATIVE mg/dL
Leukocytes,Ua: NEGATIVE
Nitrite: NEGATIVE
Protein, ur: NEGATIVE mg/dL
Specific Gravity, Urine: 1.02 (ref 1.005–1.030)
pH: 5.5 (ref 5.0–8.0)

## 2021-02-26 LAB — LACTIC ACID, PLASMA: Lactic Acid, Venous: 1 mmol/L (ref 0.5–1.9)

## 2021-02-26 LAB — I-STAT BETA HCG BLOOD, ED (MC, WL, AP ONLY): I-stat hCG, quantitative: <5 m[IU]/mL (ref <5)

## 2021-02-26 MED ORDER — IOHEXOL 300 MG/ML  SOLN
100.0000 mL | Freq: Once | INTRAMUSCULAR | Status: AC | PRN
  Administered 2021-02-26: 100 mL via INTRAVENOUS

## 2021-02-26 MED ORDER — ACETAMINOPHEN 500 MG PO TABS
1000.0000 mg | ORAL_TABLET | Freq: Once | ORAL | Status: AC
  Administered 2021-02-26: 1000 mg via ORAL
  Filled 2021-02-26: qty 2

## 2021-02-26 NOTE — ED Provider Notes (Signed)
Emergency Medicine Provider Triage Evaluation Note  Eileen Abbott , a 30 y.o. female  was evaluated in triage.  Pt complains of abdominal pain.  Patient is status post appendectomy (appendicitis with perforation) on 02/20/2021.Marland Kitchen  Patient reports her pain has been controlled until today.  There is nausea but no vomiting, pain is primarily to the right lower quadrant.  She has a tube in place, normal output.  No fevers at home, having diarrhea but no constipation.  Review of Systems  Positive: above Negative: above  Physical Exam  LMP  (LMP Unknown) Comment: Birth 47mo. ago Gen:   Awake, no distress   Resp:  Normal effort  MSK:   Moves extremities without difficulty  Other:  Right lower quadrant tenderness  Medical Decision Making  Medically screening exam initiated at 7:46 PM.  Appropriate orders placed.  Eileen Abbott was informed that the remainder of the evaluation will be completed by another provider, this initial triage assessment does not replace that evaluation, and the importance of remaining in the ED until their evaluation is complete.  AP. Not septic, mild tachycardia without fever   Theron Arista, Cordelia Poche 02/26/21 2007    Bethann Berkshire, MD 02/26/21 [phone]

## 2021-02-26 NOTE — Discharge Instructions (Signed)
It was a pleasure taking care of you today. As discussed, your CT scan showed some air in your abdomen likely due to your recent surgery. Call the surgery office tomorrow to schedule an appointment for further evaluation. You may take over the counter tylenol as needed for pain. Return to the ER for new or worsening symptoms.

## 2021-02-26 NOTE — ED Triage Notes (Signed)
Pt states that she had surgery to remove her appendix on Sun and has since been having bad pain in RLQ, denies fevers

## 2021-02-26 NOTE — Telephone Encounter (Signed)
Transition Care Management Unsuccessful Follow-up Telephone Call  Date of discharge and from where:  02/24/2021-[facility]   Attempts:  2nd Attempt  Reason for unsuccessful TCM follow-up call:  Left voice message

## 2021-02-26 NOTE — ED Provider Notes (Signed)
[facility] EMERGENCY DEPARTMENT Provider Note   CSN: [id number] Arrival date & time: 02/26/21  1943     History Chief Complaint  Patient presents with   Abdominal Pain    Eileen Abbott is a 30 y.o. female with a past medical history significant for asthma, ADHD, and fibromyalgia who presents to the ED due to progressively worsening right lower quadrant abdominal pain for the past few days.  Patient recently admitted to hospital on 12/10-12/14 due to appendicitis with perforation.  Patient had an appendectomy on 12/10.  Patient states her pain was controlled on Tylenol however, earlier today became so severe which prompted her to report to the ED for further evaluation.  Abdominal pain associated with nausea however, no vomiting.  Last bowel movement was 1 hour ago.  No vaginal bleeding.  Denies vaginal discharge.  Denies urinary symptoms.  No fever or chills.  Patient has a drain placed and admits to mostly serous drainage.  No treatment prior to arrival.  No Aggravating or alleviating factors.  History obtained from patient and past medical records. No interpreter used during encounter.       Past Medical History:  Diagnosis Date   Asthma    Attention deficit disorder    Fibromyalgia     Patient Active Problem List   Diagnosis Date Noted   Acute perforated appendicitis 02/20/2021   Gestational hypertension 07/15/2020   Vaginal delivery 07/15/2020   IUD (intrauterine device) in place 07/15/2020   Marginal insertion of umbilical cord affecting management of mother 02/19/2020   Genetic carrier status 01/22/2020   Fibromyalgia    Rh negative status during pregnancy 11/08/2019    Past Surgical History:  Procedure Laterality Date   LAPAROSCOPIC APPENDECTOMY N/A 02/20/2021   Procedure: APPENDECTOMY LAPAROSCOPIC;  Surgeon: Stechschulte, Hyman Hopes, MD;  Location: [facility] OR;  Service: General;  Laterality: N/A;   NO PAST SURGERIES       OB History     Gravida   1   Para  1   Term  1   Preterm      AB      Living  1      SAB      IAB      Ectopic      Multiple  0   Live Births  1           Family History  Problem Relation Age of Onset   Diabetes Mother    Arthritis Mother    Fibromyalgia Mother    Irritable bowel syndrome Father    Vitiligo Father    Asthma Sister    Hypertension Maternal Grandmother    Arthritis Maternal Grandmother    Fibromyalgia Maternal Grandmother    Irritable bowel syndrome Maternal Grandfather    Cancer Paternal Grandmother    Cancer Paternal Grandfather     Social History   Tobacco Use   Smoking status: Former    Packs/day: 0.50    Years: 7.00    Pack years: 3.50    Types: Cigarettes   Smokeless tobacco: Never  Vaping Use   Vaping Use: Never used  Substance Use Topics   Alcohol use: Not Currently    Comment: socially   Drug use: Not Currently    Home Medications Prior to Admission medications   Medication Sig Start Date End Date Taking? Authorizing Provider  acetaminophen (TYLENOL) 500 MG tablet Take 2 tablets (1,000 mg total) by mouth every 6 (six) hours as needed.  02/24/21   Barnetta Chapel, PA-C  amoxicillin-clavulanate (AUGMENTIN) 875-125 MG tablet Take 1 tablet by mouth 2 (two) times daily for 2 days. 02/24/21 02/26/21  Barnetta Chapel, PA-C  oxyCODONE (OXY IR/ROXICODONE) 5 MG immediate release tablet Take 1 tablet (5 mg total) by mouth every 6 (six) hours as needed for moderate pain. 02/24/21   Barnetta Chapel, PA-C    Allergies    Zofran [ondansetron], Banana, and Eggs or egg-derived products  Review of Systems   Review of Systems  Constitutional:  Negative for chills and fever.  Respiratory:  Negative for shortness of breath.   Cardiovascular:  Negative for chest pain.  Gastrointestinal:  Positive for abdominal pain and nausea. Negative for diarrhea and vomiting.  Genitourinary:  Negative for dysuria and vaginal discharge.  Musculoskeletal:  Negative for back  pain.  All other systems reviewed and are negative.  Physical Exam Updated Vital Signs BP (!) 96/58    Pulse 89    Temp (!) 97.5 F (36.4 C)    Resp (!) 24    Ht 5' (1.524 m)    Wt 59 kg    LMP  (LMP Unknown) Comment: Birth 53mo. ago   SpO2 100%    BMI 25.39 kg/m   Physical Exam Vitals and nursing note reviewed.  Constitutional:      General: She is not in acute distress.    Appearance: She is not ill-appearing.  HENT:     Head: Normocephalic.  Eyes:     Pupils: Pupils are equal, round, and reactive to light.  Cardiovascular:     Rate and Rhythm: Normal rate and regular rhythm.     Pulses: Normal pulses.     Heart sounds: Normal heart sounds. No murmur heard.   No friction rub. No gallop.  Pulmonary:     Effort: Pulmonary effort is normal.     Breath sounds: Normal breath sounds.  Abdominal:     General: Abdomen is flat. There is no distension.     Palpations: Abdomen is soft.     Tenderness: There is abdominal tenderness. There is no guarding or rebound.     Comments: Well appearing incision sites. Small amount of bloody serous fluid in drain. RLQ tenderness  Musculoskeletal:        General: Normal range of motion.     Cervical back: Neck supple.  Skin:    General: Skin is warm and dry.  Neurological:     General: No focal deficit present.     Mental Status: She is alert.  Psychiatric:        Mood and Affect: Mood normal.        Behavior: Behavior normal.    ED Results / Procedures / Treatments   Labs (all labs ordered are listed, but only abnormal results are displayed) Labs Reviewed  CBC WITH DIFFERENTIAL/PLATELET - Abnormal; Notable for the following components:      Result Value   WBC 13.3 (*)    Platelets 469 (*)    Neutro Abs 8.5 (*)    Eosinophils Absolute 0.6 (*)    Abs Immature Granulocytes 0.39 (*)    All other components within normal limits  COMPREHENSIVE METABOLIC PANEL - Abnormal; Notable for the following components:   Glucose, Bld 106 (*)     Albumin 3.0 (*)    All other components within normal limits  LIPASE, BLOOD - Abnormal; Notable for the following components:   Lipase 64 (*)    All other components within normal  limits  URINALYSIS, ROUTINE W REFLEX MICROSCOPIC  LACTIC ACID, PLASMA  I-STAT BETA HCG BLOOD, ED ([facility], [facility], [facility] ONLY)    EKG None  Radiology CT ABDOMEN PELVIS W CONTRAST  Result Date: 02/26/2021 CLINICAL DATA:  Status post appendectomy on February 20, 2021 with postoperative pain. EXAM: CT ABDOMEN AND PELVIS WITH CONTRAST TECHNIQUE: Multidetector CT imaging of the abdomen and pelvis was performed using the standard protocol following bolus administration of intravenous contrast. CONTRAST:  OMNIPAQUE IOHEXOL 300 MG/ML  SOLN COMPARISON:  February 20, 2021 FINDINGS: Lower chest: No acute abnormality. Hepatobiliary: No focal liver abnormality is seen. No gallstones, gallbladder wall thickening, or biliary dilatation. Pancreas: Unremarkable. No pancreatic ductal dilatation or surrounding inflammatory changes. Spleen: Normal in size without focal abnormality. Adrenals/Urinary Tract: Adrenal glands are unremarkable. Kidneys are normal, without renal calculi, focal lesion, or hydronephrosis. Bladder is unremarkable. Stomach/Bowel: Stomach is within normal limits. The appendix is surgically absent. No evidence of bowel wall thickening, distention, or inflammatory changes. Vascular/Lymphatic: No significant vascular findings are present. No enlarged abdominal or pelvic lymph nodes. Reproductive: An IUD is seen within an otherwise normal appearing uterus. The bilateral adnexa are unremarkable. Other: No abdominal wall hernia or abnormality. A mild-to-moderate amount of free air is seen within the anterior aspect of the right lower quadrant and mid to upper pelvis. A surgical drain is seen entering the anterior aspect of the pelvis along the midline. Its distal tip is seen within the anterolateral aspect of the right lower  quadrant. Mild posterior right lower quadrant inflammatory fat stranding is seen. A very small amount of posterior pelvic free fluid is noted. Musculoskeletal: No acute or significant osseous findings. IMPRESSION: Mild-to-moderate amount of free air within the anterior aspect of the right lower quadrant and mid to upper pelvis, likely secondary to the patient's history of recent appendectomy. Electronically Signed   By: Aram Candela M.D.   On: 02/26/2021 22:07    Procedures Procedures   Medications Ordered in ED Medications  acetaminophen (TYLENOL) tablet 1,000 mg (1,000 mg Oral Given 02/26/21 2210)  iohexol (OMNIPAQUE) 300 MG/ML solution 100 mL (100 mLs Intravenous Contrast Given 02/26/21 2157)    ED Course  I have reviewed the triage vital signs and the nursing notes.  Pertinent labs & imaging results that were available during my care of the patient were reviewed by me and considered in my medical decision making (see chart for details).  Clinical Course as of 02/26/21 2256  Fri Feb 26, 2021  2050 WBC(!): 13.3 [CA]  2050 Platelets(!): 469 [CA]    Clinical Course User Index [CA] Mannie Stabile, PA-C   MDM Rules/Calculators/A&P                         30 year old female presents to the ED due to worsening right lower quadrant abdominal pain.  Patient had an appendectomy on 12/10. Abdominal pain associated with nausea.  Upon arrival, patient mildly tachycardic at 102.  Patient nontoxic-appearing.  Physical exam significant for right lower quadrant tenderness.  Well healing incision sites.  Bloody serous drainage in drain.  No purulent drainage.  Routine labs ordered at triage.  Added lactic acid.  CT abdomen to rule out abscess formation.  CBC significant for mild leukocytosis at 13.3.  Normal hemoglobin.  Lipase mildly elevated at 64.  Lactic acid normal.  Pregnancy test negative.  CMP reassuring.  Normal renal function.  UA negative for signs of infection.  CT abdomen  personally reviewed which demonstrates mild to moderate amount of free air in right lower quadrant likely due to recent appendectomy.  10:52 PM Discussed with Dr. Derrell Lolling with general surgery who reviewed CT images and feels free air is related to recent appendectomy. No abscess. Patient is stable to follow-up with surgery in the outpatient setting.   Advised patient to call the office tomorrow to schedule an appointment for further evaluation.  Over-the-counter Tylenol as needed for pain. Strict ED precautions discussed with patient. Patient states understanding and agrees to plan. Patient discharged home in no acute distress and stable vitals  Final Clinical Impression(s) / ED Diagnoses Final diagnoses:  Right lower quadrant abdominal pain    Rx / DC Orders ED Discharge Orders     None        Mannie Stabile, PA-C 02/26/21 2256    Melene Plan, DO 02/26/21 2258

## 2021-02-26 NOTE — ED Notes (Signed)
Discharge instructions reviewed with patient. Patient verbalized understanding of instructions. Follow-up care and medications were reviewed. Patient ambulatory with steady gait. VSS upon discharge.  ?

## 2021-03-01 NOTE — Telephone Encounter (Signed)
Transition Care Management Unsuccessful Follow-up Telephone Call ° °Date of discharge and from where:  02/24/2021 from [facility] ° °Attempts:  3rd Attempt ° °Reason for unsuccessful TCM follow-up call:  Unable to reach patient ° ° ° °

## 2021-03-17 DIAGNOSIS — F4323 Adjustment disorder with mixed anxiety and depressed mood: Secondary | ICD-10-CM | POA: Diagnosis not present

## 2021-03-31 ENCOUNTER — Telehealth: Payer: Self-pay

## 2021-03-31 NOTE — Telephone Encounter (Signed)
Pt call transferred from the front office.  Pt was advised by the on call nurse that pt IUD may have come out and she would need to be seen in 24 hours.  Spoke with pt and pt informed me that she felt her vagina something plastic.  I asked pt if she could in today.  Pt informed me that she does have childcare and would to come in on Friday.  I confirmed with pt if she the device is not giving her discomfort.  Pt states that it is somewhat uncomfortable but would need to come in on Friday.  Pt call transferred to the front office to schedule appt.    Leonette Nutting  03/31/21

## 2021-04-02 ENCOUNTER — Encounter: Payer: Self-pay | Admitting: Family Medicine

## 2021-04-02 ENCOUNTER — Ambulatory Visit (INDEPENDENT_AMBULATORY_CARE_PROVIDER_SITE_OTHER): Payer: Medicaid Other | Admitting: Family Medicine

## 2021-04-02 ENCOUNTER — Other Ambulatory Visit: Payer: Self-pay

## 2021-04-02 VITALS — BP 116/76 | HR 93 | Wt 128.2 lb

## 2021-04-02 DIAGNOSIS — Z3009 Encounter for other general counseling and advice on contraception: Secondary | ICD-10-CM | POA: Diagnosis not present

## 2021-04-02 DIAGNOSIS — T8332XA Displacement of intrauterine contraceptive device, initial encounter: Secondary | ICD-10-CM

## 2021-04-02 DIAGNOSIS — Z30432 Encounter for removal of intrauterine contraceptive device: Secondary | ICD-10-CM

## 2021-04-02 MED ORDER — NORGESTIMATE-ETH ESTRADIOL 0.25-35 MG-MCG PO TABS: 1.0000 | ORAL_TABLET | Freq: Every day | ORAL | 11 refills | Status: DC

## 2021-04-02 NOTE — Assessment & Plan Note (Signed)
IUD clearly coming through cervical canal. Consent signed and removed with long kelly clamp without complication or any bleeding. Patient desired OCP's. No hx of HTN or blood clots, does not smoke. Rx sent for Ortho cyclen.

## 2021-04-02 NOTE — Progress Notes (Signed)
GYNECOLOGY OFFICE VISIT NOTE  History:   Eileen Abbott is a 31 y.o. G1P1001 here today for IUD concern.  Patient called office two days prior with concern for plastic in her vagina and possibility of IUD coming out Had post placental Liletta IUD placed 5/42022 This expelled spontaneously around the beginning of 08/2020 She had another Liletta placed on 08/25/2020 at her postpartum visit, uterus sounded to 6 cm  Today reports she has felt some cramping similar to when her IUD was expulsed before She reached in and felt the bottom of the IUD at her cervical os No intercourse since July No period since July   Health Maintenance Due  Topic Date Due   COVID-19 Vaccine (4 - Booster for Moderna series) 07/18/2020   INFLUENZA VACCINE  Never done    Past Medical History:  Diagnosis Date   Asthma    Attention deficit disorder    Fibromyalgia     Past Surgical History:  Procedure Laterality Date   LAPAROSCOPIC APPENDECTOMY N/A 02/20/2021   Procedure: APPENDECTOMY LAPAROSCOPIC;  Surgeon: Stechschulte, Hyman Hopes, MD;  Location: [facility] OR;  Service: General;  Laterality: N/A;   NO PAST SURGERIES      The following portions of the patient's history were reviewed and updated as appropriate: allergies, current medications, past family history, past medical history, past social history, past surgical history and problem list.   Health Maintenance:   Last pap: Lab Results  Component Value Date   DIAGPAP (A) 08/25/2020    - Atypical squamous cells of undetermined significance (ASC-US)   HPVHIGH Negative 08/25/2020   3 year follow up per [facility]  Last mammogram:  N/a    Review of Systems:  Pertinent items noted in HPI and remainder of comprehensive ROS otherwise negative.  Physical Exam:  BP 116/76    Pulse 93    Wt 128 lb 3.2 oz (58.2 kg)    BMI 25.04 kg/m  CONSTITUTIONAL: Well-developed, well-nourished female in no acute distress.  HEENT:  Normocephalic, atraumatic. External right  and left ear normal. No scleral icterus.  NECK: Normal range of motion, supple, no masses noted on observation SKIN: No rash noted. Not diaphoretic. No erythema. No pallor. MUSCULOSKELETAL: Normal range of motion. No edema noted. NEUROLOGIC: Alert and oriented to person, place, and time. Normal muscle tone coordination.  PSYCHIATRIC: Normal mood and affect. Normal behavior. Normal judgment and thought content. RESPIRATORY: Effort normal, no problems with respiration noted PELVIC:  Normal vagina and cervix. IUD strings seen, length about 6 cm, bottom of IUD seen at cervical external os and removed  Labs and Imaging No results found for this or any previous visit (from the past 168 hour(s)). No results found.    Assessment and Plan:   Problem List Items Addressed This Visit       Other   IUD complication (HCC) - Primary    IUD clearly coming through cervical canal. Consent signed and removed with long kelly clamp without complication or any bleeding. Patient desired OCP's. No hx of HTN or blood clots, does not smoke. Rx sent for Ortho cyclen.       Other Visit Diagnoses     Encounter for counseling regarding contraception       Relevant Medications   norgestimate-ethinyl estradiol (ORTHO-CYCLEN) 0.25-35 MG-MCG tablet       Routine preventative health maintenance measures emphasized. Please refer to After Visit Summary for other counseling recommendations.   Return for as needed.    Total face-to-face  time with patient: 15 minutes.  Over 50% of encounter was spent on counseling and coordination of care.   Venora Maples, MD/MPH Attending Family Medicine Physician, [facility] for [facility], [facility]

## 2021-04-02 NOTE — Progress Notes (Signed)
° ° °  GYNECOLOGY OFFICE PROCEDURE NOTE  Eileen Abbott is a 31 y.o. G1P1001 here for Liletta IUD removal. No GYN concerns.  Last pap smear was: Lab Results  Component Value Date   DIAGPAP (A) 08/25/2020    - Atypical squamous cells of undetermined significance (ASC-US)   HPVHIGH Negative 08/25/2020   Repeat in 3 years per [facility]  IUD Removal  Patient identified, informed consent performed, consent signed.  Patient was in the dorsal lithotomy position, normal external genitalia was noted.  A speculum was placed in the patient's vagina, normal discharge was noted, no lesions. The cervix was visualized, no lesions, no abnormal discharge.  The strings of the IUD were grasped and pulled using ring forceps. The IUD was removed in its entirety. Patient tolerated the procedure well.    Patient will use OCP's for contraception.  Routine preventative health maintenance measures emphasized.  Venora Maples, MD/MPH Attending Family Medicine Physician, [facility] for [facility], [facility]

## 2021-04-07 ENCOUNTER — Encounter: Payer: Self-pay | Admitting: *Deleted

## 2021-04-26 DIAGNOSIS — F4323 Adjustment disorder with mixed anxiety and depressed mood: Secondary | ICD-10-CM | POA: Diagnosis not present

## 2021-05-10 DIAGNOSIS — F4323 Adjustment disorder with mixed anxiety and depressed mood: Secondary | ICD-10-CM | POA: Diagnosis not present

## 2021-05-24 DIAGNOSIS — F4323 Adjustment disorder with mixed anxiety and depressed mood: Secondary | ICD-10-CM | POA: Diagnosis not present

## 2021-06-02 ENCOUNTER — Encounter: Payer: Self-pay | Admitting: Nurse Practitioner

## 2021-06-02 ENCOUNTER — Ambulatory Visit (INDEPENDENT_AMBULATORY_CARE_PROVIDER_SITE_OTHER): Payer: Medicaid Other | Admitting: Nurse Practitioner

## 2021-06-02 ENCOUNTER — Other Ambulatory Visit: Payer: Self-pay

## 2021-06-02 VITALS — BP 98/60 | HR 97 | Temp 98.2°F | Ht 60.0 in | Wt 132.0 lb

## 2021-06-02 DIAGNOSIS — F988 Other specified behavioral and emotional disorders with onset usually occurring in childhood and adolescence: Secondary | ICD-10-CM | POA: Diagnosis not present

## 2021-06-02 DIAGNOSIS — M797 Fibromyalgia: Secondary | ICD-10-CM

## 2021-06-02 MED ORDER — AMPHETAMINE-DEXTROAMPHETAMINE 15 MG PO TABS: 15.0000 mg | ORAL_TABLET | Freq: Every day | ORAL | 0 refills | Status: DC

## 2021-06-02 MED ORDER — DULOXETINE HCL 30 MG PO CPEP: 30.0000 mg | ORAL_CAPSULE | Freq: Every day | ORAL | 2 refills | Status: DC

## 2021-06-02 MED ORDER — AMPHETAMINE-DEXTROAMPHETAMINE 10 MG PO TABS: 10.0000 mg | ORAL_TABLET | Freq: Every day | ORAL | 0 refills | Status: DC

## 2021-06-02 NOTE — Progress Notes (Signed)
?Industrial/product designer as a Education administrator for [employer], FNP.,have documented all relevant documentation on the behalf of Minette Brine, FNP,as directed by  Minette Brine, FNP while in the presence of Minette Brine, [place]. ? ?This visit occurred during the SARS-CoV-2 public health emergency.  Safety protocols were in place, including screening questions prior to the visit, additional usage of staff PPE, and extensive cleaning of exam room while observing appropriate contact time as indicated for disinfecting solutions. ? ?Subjective:  ?  ? Patient ID: Eileen Abbott , female    DOB: [birth year] , 31 y.o.   MRN: [id number] ? ? ?Chief Complaint  ?Patient presents with  ? ADHD  ? ? ?HPI ? ?Patient is here for follow up on ADHD and fibromyalgia. She has been on adderral since age 31 y/o. She feels very scattered and difficulty concentrating. She took 15mg  in am and 10 mg in afternoon. She was also on duloxetine 60mg  but would like to start at a lower dose. has She was taken off of her medication when she was breastfeeding for the last 10 months. She has not been on medications for at least 1.5 years.  ?  ? ?Past Medical History:  ?Diagnosis Date  ? Asthma   ? Attention deficit disorder   ? Fibromyalgia   ?  ? ?Family History  ?Problem Relation Age of Onset  ? Diabetes Mother   ? Arthritis Mother   ? Fibromyalgia Mother   ? Irritable bowel syndrome Father   ? Vitiligo Father   ? Asthma Sister   ? Hypertension Maternal Grandmother   ? Arthritis Maternal Grandmother   ? Fibromyalgia Maternal Grandmother   ? Irritable bowel syndrome Maternal Grandfather   ? Cancer Paternal Grandmother   ? Cancer Paternal Grandfather   ? ? ? ?Current Outpatient Medications:  ?  amphetamine-dextroamphetamine (ADDERALL) 10 MG tablet, Take 1 tablet (10 mg total) by mouth daily. PM, Disp: 30 tablet, Rfl: 0 ?  amphetamine-dextroamphetamine (ADDERALL) 15 MG tablet, Take 1 tablet by mouth daily. In am, Disp: 30 tablet, Rfl: 0 ?  DULoxetine (CYMBALTA) 30 MG  capsule, Take 1 capsule (30 mg total) by mouth daily., Disp: 30 capsule, Rfl: 2 ?  norgestimate-ethinyl estradiol (ORTHO-CYCLEN) 0.25-35 MG-MCG tablet, Take 1 tablet by mouth daily., Disp: 28 tablet, Rfl: 11  ? ?Allergies  ?Allergen Reactions  ? Zofran [Ondansetron] Shortness Of Breath  ? Banana Nausea And Vomiting  ? Eggs Or Egg-Derived Products Nausea And Vomiting  ?  ? ?Review of Systems  ?Constitutional: Negative.   ?Respiratory: Negative.    ?Cardiovascular: Negative.   ?Gastrointestinal: Negative.   ?Neurological: Negative.    ? ?Today's Vitals  ? 06/02/21 1226  ?BP: 98/60  ?Pulse: 97  ?Temp: 98.2 ?F (36.8 ?C)  ?TempSrc: Oral  ?Weight: 132 lb (59.9 kg)  ?Height: 5' (1.524 m)  ? ?Body mass index is 25.78 kg/m?.  ?Wt Readings from Last 3 Encounters:  ?06/02/21 132 lb (59.9 kg)  ?04/02/21 128 lb 3.2 oz (58.2 kg)  ?02/26/21 130 lb (59 kg)  ? ? ?Objective:  ?Physical Exam ?Vitals reviewed.  ?Constitutional:   ?   General: She is not in acute distress. ?   Appearance: Normal appearance. She is well-developed.  ?Cardiovascular:  ?   Rate and Rhythm: Normal rate and regular rhythm.  ?   Pulses: Normal pulses.  ?   Heart sounds: Normal heart sounds. No murmur heard. ?Pulmonary:  ?   Effort: Pulmonary effort is normal. No respiratory distress.  ?  Breath sounds: Normal breath sounds. No wheezing.  ?Skin: ?   Capillary Refill: Capillary refill takes less than 2 seconds.  ?Neurological:  ?   General: No focal deficit present.  ?   Mental Status: She is alert and oriented to person, place, and time.  ?   Cranial Nerves: No cranial nerve deficit.  ?Psychiatric:     ?   Attention and Perception: Attention normal.     ?   Mood and Affect: Mood normal.     ?   Behavior: Behavior normal.     ?   Thought Content: Thought content normal.     ?   Cognition and Memory: Cognition normal.     ?   Judgment: Judgment normal.  ?  ? ?   ?Assessment And Plan:  ?   ?1. Fibromyalgia ?Comments: Will request records from her provider in  [place] ?- DULoxetine (CYMBALTA) 30 MG capsule; Take 1 capsule (30 mg total) by mouth daily.  Dispense: 30 capsule; Refill: 2 ? 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 In am  Dispense: 30 tablet; Refill: 0 ?- amphetamine-dextroamphetamine (ADDERALL) 10 MG tablet; Take 1 tablet (10 mg total) by mouth daily. PM  Dispense: 30 tablet; Refill: 0 ?  ? ? ?Patient was given opportunity to ask questions. Patient verbalized understanding of the plan and was able to repeat key elements of the plan. All questions were answered to their satisfaction.  ?Minette Brine, FNP  ? ?I, Minette Brine, FNP, have reviewed all documentation for this visit. The documentation on 06/02/21 for the exam, diagnosis, procedures, and orders are all accurate and complete.  ? ?IF YOU HAVE BEEN REFERRED TO A SPECIALIST, IT MAY TAKE 1-2 WEEKS TO SCHEDULE/PROCESS THE REFERRAL. IF YOU HAVE NOT HEARD FROM US/SPECIALIST IN TWO WEEKS, PLEASE GIVE [place] A CALL AT [phone] X [phone].  ? ?THE PATIENT IS ENCOURAGED TO PRACTICE SOCIAL DISTANCING DUE TO THE COVID-19 PANDEMIC.   ?

## 2021-06-02 NOTE — Patient Instructions (Signed)
Living With Attention Deficit Hyperactivity Disorder If you have been diagnosed with attention deficit hyperactivity disorder (ADHD), you may be relieved that you now know why you have felt or behaved a certain way. Still, you may feel overwhelmed about the treatment ahead. You may also wonder how to get the support you need and how to deal with the condition day-to-day. With treatment and support, you can live with ADHD and manage your symptoms. How to manage lifestyle changes Managing stress Stress is your body's reaction to life changes and events, both good and bad. To cope with the stress of an ADHD diagnosis, it may help to: Learn more about ADHD. Exercise regularly. Even a short daily walk can lower stress levels. Participate in training or education programs (including social skills training classes) that teach you to deal with symptoms.  Medicines Your health care provider may suggest certain medicines if he or she feels that they will help to improve your condition. Stimulant medicines are usually prescribed to treat ADHD, and therapy may also be prescribed. It is important to: Avoid using alcohol and other substances that may prevent your medicines from working properly (may interact). Talk with your pharmacist or health care provider about all the medicines that you take, their possible side effects, and what medicines are safe to take together. Make it your goal to take part in all treatment decisions (shared decision-making). Ask about possible side effects of medicines that your health care provider recommends, and tell him or her how you feel about having those side effects. It is best if shared decision-making with your health care provider is part of your total treatment plan. Relationships To strengthen your relationships with family members while treating your condition, consider taking part in family therapy. You might also attend self-help groups alone or with a loved one. Be  honest about how your symptoms affect your relationships. Make an effort to communicate respectfully instead of fighting, and find ways to show others that you care. Psychotherapy may be useful in helping you cope with how ADHD affects your relationships. How to recognize changes in your condition The following signs may mean that your treatment is working well and your condition is improving: Consistently being on time for appointments. Being more organized at home and work. Other people noticing improvements in your behavior. Achieving goals that you set for yourself. Thinking more clearly. The following signs may mean that your treatment is not working very well: Feeling impatience or more confusion. Missing, forgetting, or being late for appointments. An increasing sense of disorganization and messiness. More difficulty in reaching goals that you set for yourself. Loved ones becoming angry or frustrated with you. Follow these instructions at home: Take over-the-counter and prescription medicines only as told by your health care provider. Check with your health care provider before taking any new medicines. Create structure and an organized atmosphere at home. For example: Make a list of tasks, then rank them from most important to least important. Work on one task at a time until your listed tasks are done. Make a daily schedule and follow it consistently every day. Use an appointment calendar, and check it 2 or 3 times a day to keep on track. Keep it with you when you leave the house. Create spaces where you keep certain things, and always put things back in their places after you use them. Keep all follow-up visits as told by your health care provider. This is important. Where to find support Talking to others    Keep emotion out of important discussions and speak in a calm, logical way. Listen closely and patiently to your loved ones. Try to understand their point of view, and try to  avoid getting defensive. Take responsibility for the consequences of your actions. Ask that others do not take your behaviors personally. Aim to solve problems as they come up, and express your feelings instead of bottling them up. Talk openly about what you need from your loved ones and how they can support you. Consider going to family therapy sessions or having your family meet with a specialist who deals with ADHD-related behavior problems. Finances Not all insurance plans cover mental health care, so it is important to check with your insurance carrier. If paying for co-pays or counseling services is a problem, search for a local or county mental health care center. Public mental health care services may be offered there at a low cost or no cost when you are not able to see a private health care provider. If you are taking medicine for ADHD, you may be able to get the generic form, which may be less expensive than brand-name medicine. Some makers of prescription medicines also offer help to patients who cannot afford the medicines that they need. Questions to ask your health care provider: What are the risks and benefits of taking medicines? Would I benefit from therapy? How often should I follow up with a health care provider? Contact a health care provider if: You have side effects from your medicines, such as: Repeated muscle twitches, coughing, or speech outbursts. Sleep problems. Loss of appetite. Depression. New or worsening behavior problems. Dizziness. Unusually fast heartbeat. Stomach pains. Headaches. Get help right away if: You have a severe reaction to a medicine. Your behavior suddenly gets worse. Summary With treatment and support, you can live with ADHD and manage your symptoms. The medicines that are most often prescribed for ADHD are stimulants. Consider taking part in family therapy or self-help groups with family members or friends. When you talk with friends  and family about your ADHD, be patient and communicate openly. Take over-the-counter and prescription medicines only as told by your health care provider. Check with your health care provider before taking any new medicines. This information is not intended to replace advice given to you by your health care provider. Make sure you discuss any questions you have with your health care provider. Document Revised: 08/14/2019 Document Reviewed: 08/14/2019 Elsevier Patient Education  2022 Elsevier Inc.  

## 2021-06-08 DIAGNOSIS — F4321 Adjustment disorder with depressed mood: Secondary | ICD-10-CM | POA: Diagnosis not present

## 2021-06-22 DIAGNOSIS — F4323 Adjustment disorder with mixed anxiety and depressed mood: Secondary | ICD-10-CM | POA: Diagnosis not present

## 2021-06-26 ENCOUNTER — Other Ambulatory Visit: Payer: Self-pay | Admitting: Nurse Practitioner

## 2021-06-26 DIAGNOSIS — M797 Fibromyalgia: Secondary | ICD-10-CM

## 2021-07-01 ENCOUNTER — Other Ambulatory Visit: Payer: Self-pay

## 2021-07-01 ENCOUNTER — Other Ambulatory Visit: Payer: Self-pay | Admitting: Nurse Practitioner

## 2021-07-01 ENCOUNTER — Telehealth: Payer: Self-pay

## 2021-07-01 DIAGNOSIS — F988 Other specified behavioral and emotional disorders with onset usually occurring in childhood and adolescence: Secondary | ICD-10-CM

## 2021-07-01 NOTE — Telephone Encounter (Signed)
error 

## 2021-07-05 ENCOUNTER — Other Ambulatory Visit: Payer: Self-pay | Admitting: Nurse Practitioner

## 2021-07-05 DIAGNOSIS — F988 Other specified behavioral and emotional disorders with onset usually occurring in childhood and adolescence: Secondary | ICD-10-CM

## 2021-07-05 MED ORDER — AMPHETAMINE-DEXTROAMPHETAMINE 15 MG PO TABS: 15.0000 mg | ORAL_TABLET | Freq: Every day | ORAL | 0 refills | Status: DC

## 2021-07-05 MED ORDER — AMPHETAMINE-DEXTROAMPHETAMINE 10 MG PO TABS: 10.0000 mg | ORAL_TABLET | Freq: Every day | ORAL | 0 refills | Status: DC

## 2021-07-19 DIAGNOSIS — F4323 Adjustment disorder with mixed anxiety and depressed mood: Secondary | ICD-10-CM | POA: Diagnosis not present

## 2021-07-22 DIAGNOSIS — F988 Other specified behavioral and emotional disorders with onset usually occurring in childhood and adolescence: Secondary | ICD-10-CM | POA: Insufficient documentation

## 2021-07-22 DIAGNOSIS — B084 Enteroviral vesicular stomatitis with exanthem: Secondary | ICD-10-CM | POA: Diagnosis not present

## 2021-07-22 DIAGNOSIS — I10 Essential (primary) hypertension: Secondary | ICD-10-CM | POA: Diagnosis not present

## 2021-08-03 ENCOUNTER — Other Ambulatory Visit: Payer: Self-pay | Admitting: Nurse Practitioner

## 2021-08-03 DIAGNOSIS — F988 Other specified behavioral and emotional disorders with onset usually occurring in childhood and adolescence: Secondary | ICD-10-CM

## 2021-08-04 MED ORDER — AMPHETAMINE-DEXTROAMPHETAMINE 15 MG PO TABS: 15.0000 mg | ORAL_TABLET | Freq: Every day | ORAL | 0 refills | Status: DC

## 2021-08-04 MED ORDER — AMPHETAMINE-DEXTROAMPHETAMINE 10 MG PO TABS: 10.0000 mg | ORAL_TABLET | Freq: Every day | ORAL | 0 refills | Status: DC

## 2021-09-02 ENCOUNTER — Encounter: Payer: Self-pay | Admitting: Nurse Practitioner

## 2021-09-02 ENCOUNTER — Ambulatory Visit: Payer: Medicaid Other | Admitting: Nurse Practitioner

## 2021-09-02 VITALS — BP 114/62 | HR 109 | Temp 98.6°F | Ht 60.0 in | Wt 127.0 lb

## 2021-09-02 DIAGNOSIS — F988 Other specified behavioral and emotional disorders with onset usually occurring in childhood and adolescence: Secondary | ICD-10-CM | POA: Diagnosis not present

## 2021-09-02 DIAGNOSIS — Z8709 Personal history of other diseases of the respiratory system: Secondary | ICD-10-CM | POA: Diagnosis not present

## 2021-09-02 DIAGNOSIS — N6312 Unspecified lump in the right breast, upper inner quadrant: Secondary | ICD-10-CM

## 2021-09-02 DIAGNOSIS — R21 Rash and other nonspecific skin eruption: Secondary | ICD-10-CM | POA: Diagnosis not present

## 2021-09-02 DIAGNOSIS — M797 Fibromyalgia: Secondary | ICD-10-CM | POA: Diagnosis not present

## 2021-09-02 MED ORDER — AMPHETAMINE-DEXTROAMPHETAMINE 15 MG PO TABS: 15.0000 mg | ORAL_TABLET | Freq: Every day | ORAL | 0 refills | Status: DC

## 2021-09-02 MED ORDER — HYDROCORTISONE 0.5 % EX CREA: TOPICAL_CREAM | CUTANEOUS | 0 refills | Status: DC

## 2021-09-02 MED ORDER — ALBUTEROL SULFATE HFA 108 (90 BASE) MCG/ACT IN AERS: 2.0000 | INHALATION_SPRAY | Freq: Four times a day (QID) | RESPIRATORY_TRACT | 2 refills | Status: DC | PRN

## 2021-09-02 MED ORDER — AMPHETAMINE-DEXTROAMPHETAMINE 10 MG PO TABS: 10.0000 mg | ORAL_TABLET | Freq: Every day | ORAL | 0 refills | Status: DC

## 2021-09-02 NOTE — Progress Notes (Signed)
I,Tianna Badgett,acting as a Neurosurgeon for [employer], FNP.,have documented all relevant documentation on the behalf of Arnette Felts, FNP,as directed by  Arnette Felts, FNP while in the presence of Arnette Felts, FNP.  Subjective:     Patient ID: Eileen Abbott , female    DOB: 1991-02-07 , 31 y.o.   MRN: [id number]   Chief Complaint  Patient presents with   ADHD    HPI  Patient is here for follow up on ADHD and fibromyalgia.  Denies family history of breast lumps.     Past Medical History:  Diagnosis Date   Asthma    Attention deficit disorder    Fibromyalgia      Family History  Problem Relation Age of Onset   Diabetes Mother    Arthritis Mother    Fibromyalgia Mother    Irritable bowel syndrome Father    Vitiligo Father    Asthma Sister    Hypertension Maternal Grandmother    Arthritis Maternal Grandmother    Fibromyalgia Maternal Grandmother    Irritable bowel syndrome Maternal Grandfather    Cancer Paternal Grandmother    Cancer Paternal Grandfather      Current Outpatient Medications:    albuterol (VENTOLIN HFA) 108 (90 Base) MCG/ACT inhaler, Inhale 2 puffs into the lungs every 6 (six) hours as needed for wheezing or shortness of breath., Disp: 8 g, Rfl: 2   hydrocortisone cream 0.5 %, Apply to area twice a day as needed, Disp: 30 g, Rfl: 0   amphetamine-dextroamphetamine (ADDERALL) 10 MG tablet, Take 1 tablet (10 mg total) by mouth daily. PM, Disp: 30 tablet, Rfl: 0   amphetamine-dextroamphetamine (ADDERALL) 15 MG tablet, Take 1 tablet by mouth daily. In am, Disp: 30 tablet, Rfl: 0   DULoxetine (CYMBALTA) 30 MG capsule, TAKE 1 CAPSULE BY MOUTH EVERY DAY, Disp: 90 capsule, Rfl: 1   norgestimate-ethinyl estradiol (ORTHO-CYCLEN) 0.25-35 MG-MCG tablet, Take 1 tablet by mouth daily., Disp: 28 tablet, Rfl: 11   Allergies  Allergen Reactions   Zofran [Ondansetron] Shortness Of Breath   Banana Nausea And Vomiting   Eggs Or Egg-Derived Products Nausea And Vomiting      Review of Systems  Constitutional: Negative.   Respiratory: Negative.    Cardiovascular: Negative.   Gastrointestinal: Negative.   Skin:        Right breast lump x 2 weeks, denies pain. She is no longer breast feeding. Denies discharge   Neurological: Negative.      Today's Vitals   09/02/21 1604  BP: 114/62  Pulse: (!) 109  Temp: 98.6 F (37 C)  TempSrc: Oral  Weight: 127 lb (57.6 kg)  Height: 5' (1.524 m)   Body mass index is 24.8 kg/m.   Objective:  Physical Exam Vitals reviewed.  Cardiovascular:     Pulses: Normal pulses.     Heart sounds: Normal heart sounds.  Neurological:     General: No focal deficit present.     Mental Status: She is oriented to person, place, and time.     Cranial Nerves: No cranial nerve deficit.     Motor: No weakness.         Assessment And Plan:     1. 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 In am  Dispense: 30 tablet; Refill: 0 - amphetamine-dextroamphetamine (ADDERALL) 10 MG tablet; Take 1 tablet (10 mg total) by mouth daily. PM  Dispense: 30 tablet; Refill: 0  3. History of asthma - albuterol (VENTOLIN HFA) 108 (90 Base) MCG/ACT inhaler; Inhale 2 puffs into the lungs every 6 (six) hours as needed for wheezing or shortness of breath.  Dispense: 8 g; Refill: 2  4. Rash and nonspecific skin eruption - hydrocortisone cream 0.5 %; Apply to area twice a day as needed  Dispense: 30 g; Refill: 0  5. Breast lump on right side at 1 o'clock position Comments: firm mobile lump present, nontender - MM Digital [facility] Unilat R; Future     Patient was given opportunity to ask questions. Patient verbalized understanding of the plan and was able to repeat key  elements of the plan. All questions were answered to their satisfaction.  Arnette Felts, FNP   . I, Arnette Felts, FNP, have reviewed all documentation for this visit. The documentation on 09/02/21 for the exam, diagnosis, procedures, and orders are all accurate and complete  IF YOU HAVE BEEN REFERRED TO A SPECIALIST, IT MAY TAKE 1-2 WEEKS TO SCHEDULE/PROCESS THE REFERRAL. IF YOU HAVE NOT HEARD FROM US/SPECIALIST IN TWO WEEKS, PLEASE GIVE [place] A CALL AT [phone].   THE PATIENT IS ENCOURAGED TO PRACTICE SOCIAL DISTANCING DUE TO THE COVID-19 PANDEMIC.

## 2021-09-02 NOTE — Patient Instructions (Signed)
Attention Deficit Hyperactivity Disorder, Adult Attention deficit hyperactivity disorder (ADHD) is a mental health disorder that starts during childhood (neurodevelopmental disorder). For many people with ADHD, the disorder continues into the adult years. Treatment can help you manage your symptoms. What are the causes? The exact cause of ADHD is not known. Most experts believe genetics and environmental factors contribute to ADHD. What increases the risk? The following factors may make you more likely to develop this condition: Having a family history of ADHD. Being female. Being born to a mother who smoked or drank alcohol during pregnancy. Being exposed to lead or other toxins in the womb or early in life. Being born before 37 weeks of pregnancy (prematurely) or at a low birth weight. Having experienced a brain injury. What are the signs or symptoms? Symptoms of this condition depend on the type of ADHD. The two main types are inattentive and hyperactive-impulsive. Some people may have symptoms of both types. Symptoms of the inattentive type include: Difficulty paying attention. Making careless mistakes. Not following instructions. Being disorganized. Avoiding tasks that require time and attention. Losing and forgetting things. Being easily distracted. Symptoms of the hyperactive-impulsive type include: Restlessness. Talking too much. Interrupting. Difficulty with: Sitting still. Feeling motivated. Relaxing. Waiting in line or waiting for a turn. In adults, this condition may lead to certain problems, such as: Keeping jobs. Performing tasks at work. Having stable relationships. Being on time or keeping to a schedule. How is this diagnosed? This condition is diagnosed based on your current symptoms and your history of symptoms. The diagnosis can be made by a health care provider such as a primary care provider or a mental health care specialist. Your health care provider may use  a symptom checklist or a behavior rating scale to evaluate your symptoms. He or she may also want to talk with people who have observed your behaviors throughout your life. How is this treated? This condition can be treated with medicines and behavior therapy. Medicines may be the best option to reduce impulsive behaviors and improve attention. Your health care provider may recommend: Stimulant medicines. These are the most common medicines used for adult ADHD. They affect certain chemicals in the brain (neurotransmitters) and improve your ability to control your symptoms. A non-stimulant medicine for adult ADHD (atomoxetine). This medicine increases a neurotransmitter called norepinephrine. It may take weeks to months to see effects from this medicine. Counseling and behavioral management are also important for treating ADHD. Counseling is often used along with medicine. Your health care provider may suggest: Cognitive behavioral therapy (CBT). This type of therapy teaches you to replace negative thoughts and actions with positive thoughts and actions. When used as part of ADHD treatment, this therapy may also include: Coping strategies for organization, time management, impulse control, and stress reduction. Mindfulness and meditation training. Behavioral management. You may work with a coach who is specially trained to help people with ADHD manage and organize activities and function more effectively. Follow these instructions at home: Medicines  Take over-the-counter and prescription medicines only as told by your health care provider. Talk with your health care provider about the possible side effects of your medicines and how to manage them. Lifestyle  Do not use drugs. Do not drink alcohol if: Your health care provider tells you not to drink. You are pregnant, may be pregnant, or are planning to become pregnant. If you drink alcohol: Limit how much you use to: 0-1 drink a day for  women. 0-2 drinks a day   for men. Be aware of how much alcohol is in your drink. In the [place] one drink equals one 12 oz bottle of beer (355 mL), one 5 oz glass of wine (148 mL), or one 1 oz glass of hard liquor (44 mL). Get enough sleep. Eat a healthy diet. Exercise regularly. Exercise can help to reduce stress and anxiety. General instructions Learn as much as you can about adult ADHD, and work closely with your health care providers to find the treatments that work best for you. Follow the same schedule each day. Use reminder devices like notes, calendars, and phone apps to stay on time and organized. Keep all follow-up visits as told by your health care provider and therapist. This is important. Where to find more information A health care provider may be able to recommend resources that are available online or over the phone. You could start with: Attention Deficit Disorder Association (ADDA): www.add.org National Institute of Mental Health (NIMH): www.nimh.nih.gov Contact a health care provider if: Your symptoms continue to cause problems. You have side effects from your medicine, such as: Repeated muscle twitches, coughing, or speech outbursts. Sleep problems. Loss of appetite. Dizziness. Unusually fast heartbeat. Stomach pains. Headaches. You are struggling with anxiety, depression, or substance abuse. Get help right away if you: Have a severe reaction to a medicine. If you ever feel like you may hurt yourself or others, or have thoughts about taking your own life, get help right away. You can go to the nearest emergency department or call: Your local emergency services (911 in the [place] A suicide crisis helpline, such as the National Suicide Prevention Lifeline at 1-800-273-8255 or 988 in the [place] This is open 24 hours a day. Summary ADHD is a mental health disorder that starts during childhood (neurodevelopmental disorder) and often continues into the adult years. The  exact cause of ADHD is not known. Most experts believe genetics and environmental factors contribute to ADHD. There is no cure for ADHD, but treatment with medicine, cognitive behavioral therapy, or behavioral management can help you manage your condition. This information is not intended to replace advice given to you by your health care provider. Make sure you discuss any questions you have with your health care provider. Document Revised: 09/23/2020 Document Reviewed: 07/23/2018 Elsevier Patient Education  2023 Elsevier Inc.  

## 2021-09-20 ENCOUNTER — Other Ambulatory Visit: Payer: Self-pay | Admitting: Nurse Practitioner

## 2021-09-20 DIAGNOSIS — N6312 Unspecified lump in the right breast, upper inner quadrant: Secondary | ICD-10-CM

## 2021-10-04 ENCOUNTER — Other Ambulatory Visit: Payer: Self-pay | Admitting: Nurse Practitioner

## 2021-10-04 ENCOUNTER — Ambulatory Visit
Admission: RE | Admit: 2021-10-04 | Discharge: 2021-10-04 | Disposition: A | Discharge status: Home / Self Care | Payer: Medicaid Other | Source: Ambulatory Visit | Attending: Nurse Practitioner | Admitting: Nurse Practitioner

## 2021-10-04 DIAGNOSIS — F988 Other specified behavioral and emotional disorders with onset usually occurring in childhood and adolescence: Secondary | ICD-10-CM

## 2021-10-04 DIAGNOSIS — N6320 Unspecified lump in the left breast, unspecified quadrant: Secondary | ICD-10-CM

## 2021-10-04 DIAGNOSIS — N6312 Unspecified lump in the right breast, upper inner quadrant: Secondary | ICD-10-CM

## 2021-10-04 DIAGNOSIS — N6002 Solitary cyst of left breast: Secondary | ICD-10-CM

## 2021-10-05 MED ORDER — AMPHETAMINE-DEXTROAMPHETAMINE 15 MG PO TABS: 15.0000 mg | ORAL_TABLET | Freq: Every day | ORAL | 0 refills | Status: DC

## 2021-10-05 MED ORDER — AMPHETAMINE-DEXTROAMPHETAMINE 10 MG PO TABS: 10.0000 mg | ORAL_TABLET | Freq: Every day | ORAL | 0 refills | Status: DC

## 2021-10-07 ENCOUNTER — Other Ambulatory Visit: Payer: Medicaid Other

## 2021-10-11 ENCOUNTER — Ambulatory Visit
Admission: RE | Admit: 2021-10-11 | Discharge: 2021-10-11 | Disposition: A | Discharge status: Home / Self Care | Payer: Medicaid Other | Source: Ambulatory Visit | Attending: Nurse Practitioner | Admitting: Nurse Practitioner

## 2021-10-11 DIAGNOSIS — N6321 Unspecified lump in the left breast, upper outer quadrant: Secondary | ICD-10-CM | POA: Diagnosis not present

## 2021-10-11 DIAGNOSIS — N6002 Solitary cyst of left breast: Secondary | ICD-10-CM

## 2021-10-11 DIAGNOSIS — N6320 Unspecified lump in the left breast, unspecified quadrant: Secondary | ICD-10-CM

## 2021-10-11 DIAGNOSIS — J029 Acute pharyngitis, unspecified: Secondary | ICD-10-CM | POA: Diagnosis not present

## 2021-10-11 DIAGNOSIS — Z20822 Contact with and (suspected) exposure to covid-19: Secondary | ICD-10-CM | POA: Diagnosis not present

## 2021-10-11 DIAGNOSIS — B349 Viral infection, unspecified: Secondary | ICD-10-CM | POA: Diagnosis not present

## 2021-11-17 ENCOUNTER — Encounter: Payer: Medicaid Other | Admitting: Nurse Practitioner

## 2021-12-01 ENCOUNTER — Ambulatory Visit (INDEPENDENT_AMBULATORY_CARE_PROVIDER_SITE_OTHER): Payer: Medicaid Other | Admitting: Nurse Practitioner

## 2021-12-01 ENCOUNTER — Encounter: Payer: Self-pay | Admitting: Nurse Practitioner

## 2021-12-01 VITALS — BP 120/64 | HR 94 | Temp 98.2°F | Ht 60.0 in | Wt 131.6 lb

## 2021-12-01 DIAGNOSIS — Z0000 Encounter for general adult medical examination without abnormal findings: Secondary | ICD-10-CM

## 2021-12-01 DIAGNOSIS — Z8709 Personal history of other diseases of the respiratory system: Secondary | ICD-10-CM

## 2021-12-01 DIAGNOSIS — F988 Other specified behavioral and emotional disorders with onset usually occurring in childhood and adolescence: Secondary | ICD-10-CM | POA: Diagnosis not present

## 2021-12-01 DIAGNOSIS — M797 Fibromyalgia: Secondary | ICD-10-CM | POA: Diagnosis not present

## 2021-12-01 DIAGNOSIS — E559 Vitamin D deficiency, unspecified: Secondary | ICD-10-CM | POA: Diagnosis not present

## 2021-12-01 DIAGNOSIS — Z23 Encounter for immunization: Secondary | ICD-10-CM | POA: Diagnosis not present

## 2021-12-01 MED ORDER — AMPHETAMINE-DEXTROAMPHETAMINE 15 MG PO TABS: 15.0000 mg | ORAL_TABLET | Freq: Every day | ORAL | 0 refills | Status: DC

## 2021-12-01 MED ORDER — AMPHETAMINE-DEXTROAMPHETAMINE 10 MG PO TABS
10.0000 mg | ORAL_TABLET | Freq: Every day | ORAL | 0 refills | Status: DC
Start: 2021-12-01 — End: 2021-12-31

## 2021-12-01 MED ORDER — ALBUTEROL SULFATE HFA 108 (90 BASE) MCG/ACT IN AERS: 2.0000 | INHALATION_SPRAY | Freq: Four times a day (QID) | RESPIRATORY_TRACT | 2 refills | Status: DC | PRN

## 2021-12-01 MED ORDER — DULOXETINE HCL 30 MG PO CPEP
ORAL_CAPSULE | ORAL | 1 refills | Status: DC
Start: 2021-12-01 — End: 2022-06-14

## 2021-12-01 NOTE — Progress Notes (Signed)
I,Tianna Badgett,acting as a scribe for Janece Moore, FNP.,have documented all relevant documentation on the behalf of Janece Moore, FNP,as directed by  Janece Moore, FNP while in the presence of Janece Moore, FNP.  Subjective:     Patient ID: Eileen Abbott , female    DOB: 12/08/1990 , 31 y.o.   MRN: [id number]   Chief Complaint  Patient presents with   Annual Exam    HPI  Patient presents today for HM. No current concerns     Past Medical History:  Diagnosis Date   Asthma    Attention deficit disorder    Fibromyalgia      Family History  Problem Relation Age of Onset   Diabetes Mother    Arthritis Mother    Fibromyalgia Mother    Irritable bowel syndrome Father    Vitiligo Father    Prostate cancer Father    Asthma Sister    Hypertension Maternal Grandmother    Arthritis Maternal Grandmother    Fibromyalgia Maternal Grandmother    Irritable bowel syndrome Maternal Grandfather    Cancer Paternal Grandmother    Cancer Paternal Grandfather      Current Outpatient Medications:    hydrocortisone cream 0.5 %, Apply to area twice a day as needed, Disp: 30 g, Rfl: 0   norgestimate-ethinyl estradiol (ORTHO-CYCLEN) 0.25-35 MG-MCG tablet, Take 1 tablet by mouth daily., Disp: 28 tablet, Rfl: 11   albuterol (VENTOLIN HFA) 108 (90 Base) MCG/ACT inhaler, Inhale 2 puffs into the lungs every 6 (six) hours as needed for wheezing or shortness of breath., Disp: 8 g, Rfl: 2   amphetamine-dextroamphetamine (ADDERALL) 10 MG tablet, Take 1 tablet (10 mg total) by mouth daily. PM, Disp: 30 tablet, Rfl: 0   amphetamine-dextroamphetamine (ADDERALL) 15 MG tablet, Take 1 tablet by mouth daily. In am, Disp: 30 tablet, Rfl: 0   DULoxetine (CYMBALTA) 30 MG capsule, TAKE 1 CAPSULE BY MOUTH EVERY DAY, Disp: 90 capsule, Rfl: 1   Allergies  Allergen Reactions   Zofran [Ondansetron] Shortness Of Breath   Banana Nausea And Vomiting   Eggs Or Egg-Derived Products Nausea And Vomiting      The  patient states she uses OCP (estrogen/progesterone) for birth control.  Patient's last menstrual period was 11/17/2021 (approximate).. Negative for Dysmenorrhea and Negative for Menorrhagia. Negative for: breast discharge, breast lump(s), breast pain and breast self exam. Associated symptoms include abnormal vaginal bleeding. Pertinent negatives include abnormal bleeding (hematology), anxiety, decreased libido, depression, difficulty falling sleep, dyspareunia, history of infertility, nocturia, sexual dysfunction, sleep disturbances, urinary incontinence, urinary urgency, vaginal discharge and vaginal itching. Diet regular.  The patient states her exercise level is moderate 3 times a week, approximately 1 hour each time  The patient's tobacco use is:  Social History   Tobacco Use  Smoking Status Former   Packs/day: 0.50   Years: 7.00   Total pack years: 3.50   Types: Cigarettes  Smokeless Tobacco Never   She has been exposed to passive smoke. The patient's alcohol use is:  Social History   Substance and Sexual Activity  Alcohol Use Not Currently   Comment: socially   Additional information: Last pap 08/2020, next one scheduled for 08/2023.    Review of Systems  Constitutional: Negative.   HENT: Negative.    Eyes: Negative.   Respiratory: Negative.    Cardiovascular: Negative.   Gastrointestinal: Negative.   Endocrine: Negative.   Genitourinary: Negative.   Musculoskeletal: Negative.   Skin: Negative.   Allergic/Immunologic: Negative.     Neurological: Negative.   Hematological: Negative.   Psychiatric/Behavioral: Negative.       Today's Vitals   12/01/21 1112  BP: 120/64  Pulse: 94  Temp: 98.2 F (36.8 C)  TempSrc: Oral  Weight: 131 lb 9.6 oz (59.7 kg)  Height: 5' (1.524 m)   Body mass index is 25.7 kg/m.  Wt Readings from Last 3 Encounters:  12/01/21 131 lb 9.6 oz (59.7 kg)  09/02/21 127 lb (57.6 kg)  06/02/21 132 lb (59.9 kg)    Objective:  Physical  Exam Vitals reviewed.  Constitutional:      General: She is not in acute distress.    Appearance: Normal appearance. She is well-developed.  HENT:     Head: Normocephalic and atraumatic.     Right Ear: Hearing, tympanic membrane, ear canal and external ear normal. There is no impacted cerumen.     Left Ear: Hearing, tympanic membrane, ear canal and external ear normal. There is no impacted cerumen.     Nose:     Comments: Deferred - masked    Mouth/Throat:     Comments: Deferred - masked Eyes:     General: Lids are normal.     Extraocular Movements: Extraocular movements intact.     Conjunctiva/sclera: Conjunctivae normal.     Pupils: Pupils are equal, round, and reactive to light.     Funduscopic exam:    Right eye: No papilledema.        Left eye: No papilledema.  Neck:     Thyroid: No thyroid mass.     Vascular: No carotid bruit.  Cardiovascular:     Rate and Rhythm: Normal rate and regular rhythm.     Pulses: Normal pulses.     Heart sounds: Normal heart sounds. No murmur heard. Pulmonary:     Effort: Pulmonary effort is normal. No respiratory distress.     Breath sounds: Normal breath sounds. No wheezing.  Chest:     Chest wall: No mass.  Breasts:    Tanner Score is 5.     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 No tenderness.     Left: Normal. No mass or tenderness.    Abdominal:     General: Abdomen is flat. Bowel sounds are normal. There is no distension.     Palpations: Abdomen is soft.     Tenderness: There is no abdominal tenderness.  Musculoskeletal:        General: No swelling or tenderness. Normal range of motion.     Cervical back: Full passive range of motion without pain, normal range of motion and neck supple.     Right lower leg: No edema.     Left lower leg: No edema.  Lymphadenopathy:     Upper Body:     Right upper body: No supraclavicular, axillary or pectoral adenopathy.     Left upper body: No  supraclavicular, axillary or pectoral adenopathy.  Skin:    General: Skin is warm and dry.     Capillary Refill: Capillary refill takes less than 2 seconds.  Neurological:     General: No focal deficit present.     Mental Status: She is alert and oriented to person, place, and time.     Cranial Nerves: No cranial nerve deficit.     Sensory: No sensory deficit.     Motor: No weakness.  Psychiatric:        Mood and Affect: Mood normal.  Behavior: Behavior normal.        Thought Content: Thought content normal.        Judgment: Judgment normal.         Assessment And Plan:     1. Encounter for annual physical exam Behavior modifications discussed and diet history reviewed.   Pt will continue to exercise regularly and modify diet with low GI, plant based foods and decrease intake of processed foods.  Recommend intake of daily multivitamin, Vitamin D, and calcium.  Recommend self breast exams monthly the week after he cycle for preventive screenings, as well as recommend immunizations that include influenza, TDAP - CBC  2. Need for influenza vaccination Influenza vaccine administered Encouraged to take Tylenol as needed for fever or muscle aches.= - Flu Vaccine QUAD 6+ mos PF IM (Fluarix Quad PF)  3. 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 PM  Dispense: 30 tablet; Refill: 0 - amphetamine-dextroamphetamine (ADDERALL) 15 MG tablet; Take 1 tablet by mouth daily. In am  Dispense: 30 tablet; Refill: 0  5. History of asthma Comments: Had to use her albuterol last week due to being around smoke - albuterol (VENTOLIN HFA) 108 (90 Base) MCG/ACT inhaler; Inhale 2 puffs into the  lungs every 6 (six) hours as needed for wheezing or shortness of breath.  Dispense: 8 g; Refill: 2  6. Fibromyalgia Comments: Will request records from her provider in [place] - DULoxetine (CYMBALTA) 30 MG capsule; TAKE 1 CAPSULE BY MOUTH EVERY DAY  Dispense: 90 capsule; Refill: 1     Patient was given opportunity to ask questions. Patient verbalized understanding of the plan and was able to repeat key elements of the plan. All questions were answered to their satisfaction.   Minette Brine, FNP   I, Minette Brine, FNP, have reviewed all documentation for this visit. The documentation on 12/01/21 for the exam, diagnosis, procedures, and orders are all accurate and complete.   THE PATIENT IS ENCOURAGED TO PRACTICE SOCIAL DISTANCING DUE TO THE COVID-19 PANDEMIC.

## 2021-12-01 NOTE — Patient Instructions (Signed)

## 2021-12-02 LAB — CBC
Hematocrit: 43.8 % (ref 34.0–46.6)
Hemoglobin: 15.1 g/dL (ref 11.1–15.9)
MCH: 31.1 pg (ref 26.6–33.0)
MCHC: 34.5 g/dL (ref 31.5–35.7)
MCV: 90 fL (ref 79–97)
Platelets: 278 10*3/uL (ref 150–450)
RBC: 4.85 x10E6/uL (ref 3.77–5.28)
RDW: 11.8 % (ref 11.7–15.4)
WBC: 5.7 10*3/uL (ref 3.4–10.8)

## 2021-12-02 LAB — CMP14+EGFR
ALT: 15 IU/L (ref 0–32)
AST: 15 IU/L (ref 0–40)
Albumin/Globulin Ratio: 1.6 (ref 1.2–2.2)
Albumin: 4.5 g/dL (ref 3.9–4.9)
Alkaline Phosphatase: 47 IU/L (ref 44–121)
BUN/Creatinine Ratio: 7 — ABNORMAL LOW (ref 9–23)
BUN: 6 mg/dL (ref 6–20)
Bilirubin Total: 0.3 mg/dL (ref 0.0–1.2)
CO2: 22 mmol/L (ref 20–29)
Calcium: 9.5 mg/dL (ref 8.7–10.2)
Chloride: 103 mmol/L (ref 96–106)
Creatinine, Ser: 0.88 mg/dL (ref 0.57–1.00)
Globulin, Total: 2.8 g/dL (ref 1.5–4.5)
Glucose: 85 mg/dL (ref 70–99)
Potassium: 4.4 mmol/L (ref 3.5–5.2)
Sodium: 140 mmol/L (ref 134–144)
Total Protein: 7.3 g/dL (ref 6.0–8.5)
eGFR: 89 mL/min/{1.73_m2} (ref >59)

## 2021-12-02 LAB — VITAMIN D 25 HYDROXY (VIT D DEFICIENCY, FRACTURES): Vit D, 25-Hydroxy: 39.1 ng/mL (ref 30.0–100.0)

## 2021-12-31 ENCOUNTER — Other Ambulatory Visit: Payer: Self-pay | Admitting: Nurse Practitioner

## 2021-12-31 DIAGNOSIS — F988 Other specified behavioral and emotional disorders with onset usually occurring in childhood and adolescence: Secondary | ICD-10-CM

## 2022-01-03 ENCOUNTER — Other Ambulatory Visit (HOSPITAL_COMMUNITY): Payer: Self-pay

## 2022-01-03 MED ORDER — AMPHETAMINE-DEXTROAMPHETAMINE 10 MG PO TABS
10.0000 mg | ORAL_TABLET | Freq: Every evening | ORAL | 0 refills | Status: DC
  Filled 2022-01-03 – 2022-01-17 (×2): qty 30, 30d supply, fill #0

## 2022-01-03 MED ORDER — AMPHETAMINE-DEXTROAMPHETAMINE 15 MG PO TABS
15.0000 mg | ORAL_TABLET | Freq: Every morning | ORAL | 0 refills | Status: DC
  Filled 2022-01-03 – 2022-01-17 (×2): qty 30, 30d supply, fill #0

## 2022-01-04 ENCOUNTER — Other Ambulatory Visit (HOSPITAL_COMMUNITY): Payer: Self-pay

## 2022-01-15 ENCOUNTER — Other Ambulatory Visit (HOSPITAL_COMMUNITY): Payer: Self-pay

## 2022-01-17 ENCOUNTER — Other Ambulatory Visit (HOSPITAL_COMMUNITY): Payer: Self-pay

## 2022-02-15 IMAGING — US US OB < 14 WEEKS - US OB TV
1 series · 15 of 28 positions shown · non-contrast
Comparison: None

CLINICAL DATA: First trimester pregnancy, abdominal cramping, LMP
09/01/2019

EXAM:
OBSTETRIC <14 WK US AND TRANSVAGINAL OB US
TECHNIQUE: Both transabdominal and transvaginal ultrasound examinations were
performed for complete evaluation of the gestation as well as the
maternal uterus, adnexal regions, and pelvic cul-de-sac.
Transvaginal technique was performed to assess early pregnancy.

[Series 1: us ob < 14 weeks - us ob tv · 15 of 68 slices shown]
[im 1/68]
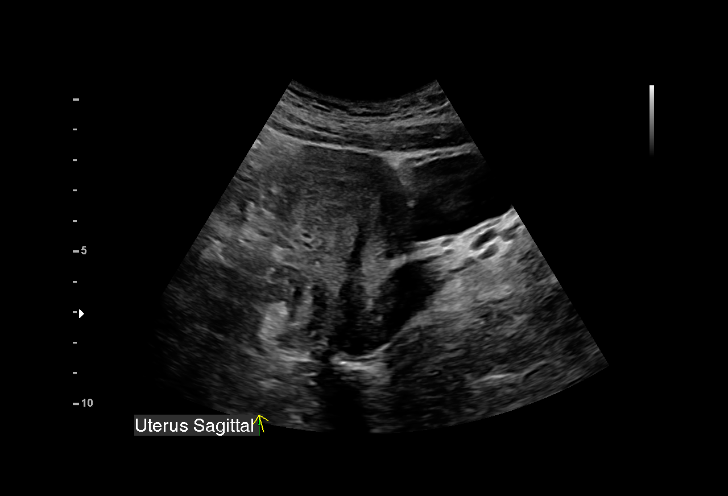
[im 5/68]
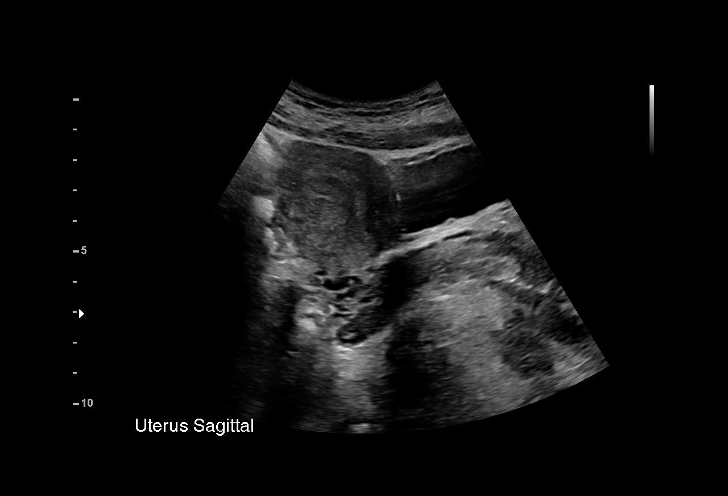
[im 10/68]
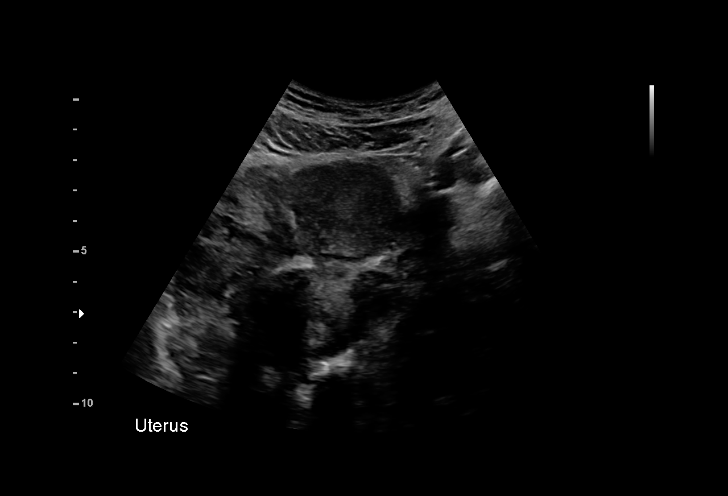
[im 15/68]
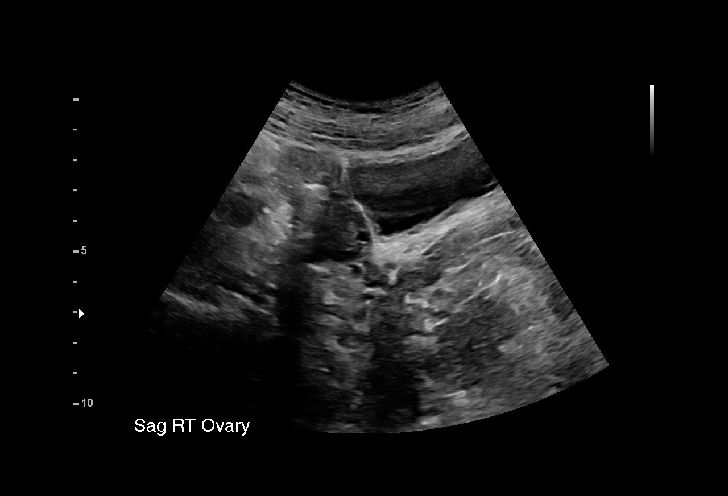
[im 20/68]
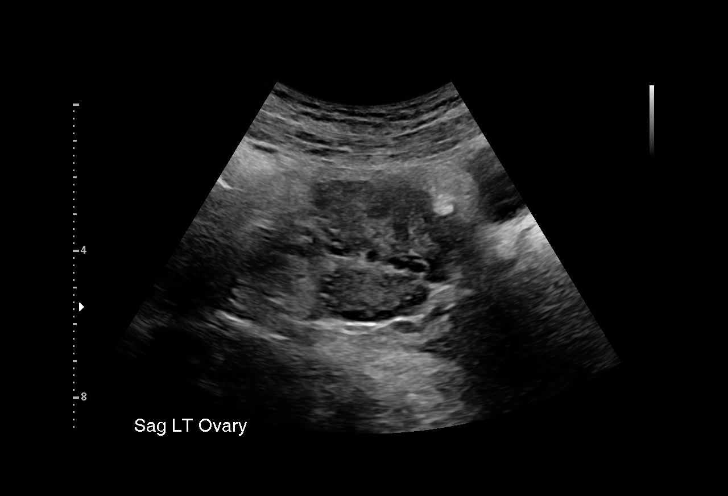
[im 25/68]
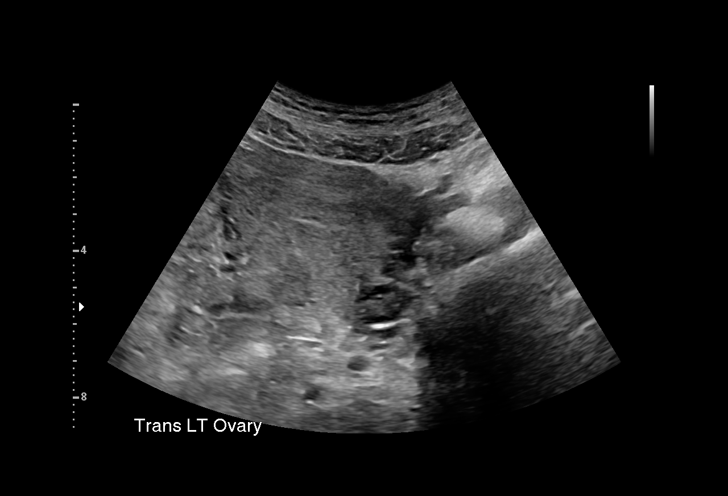
[im 30/68]
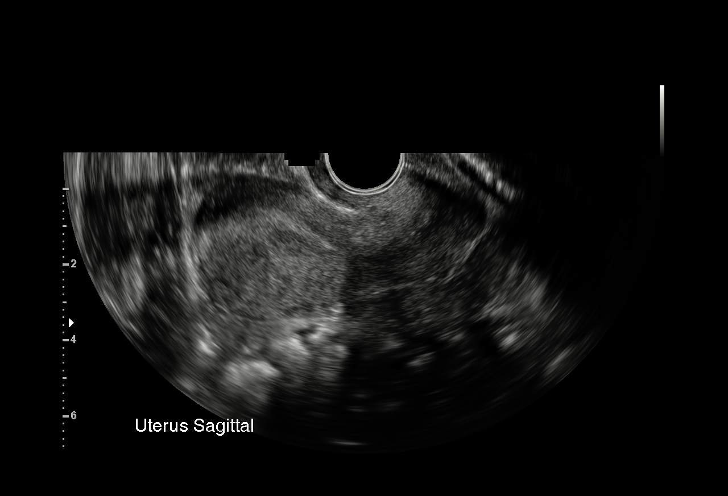
[im 35/68]
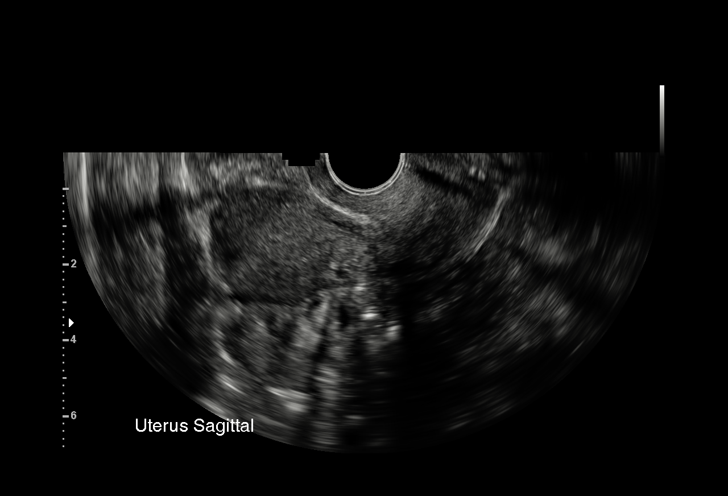
[im 38/68]
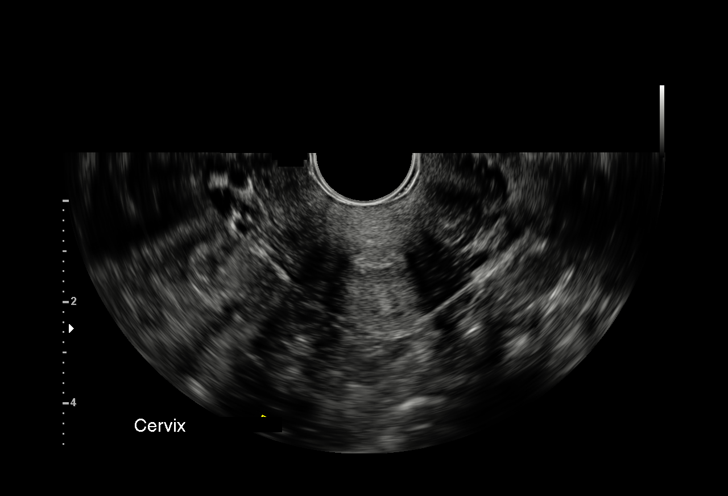
[im 43/68]
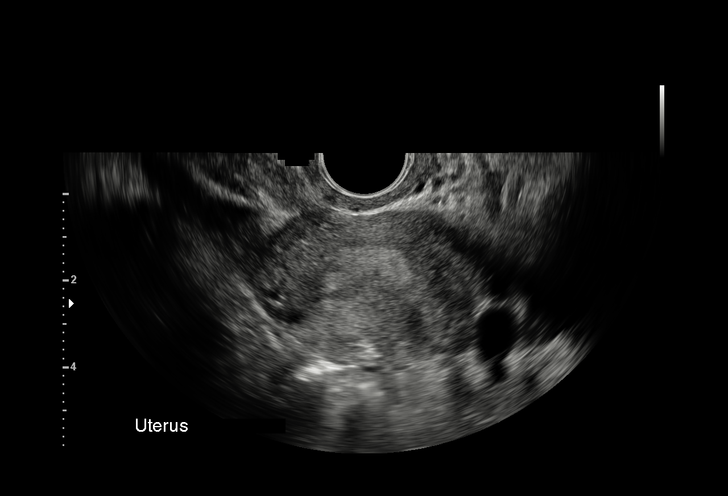
[im 48/68]
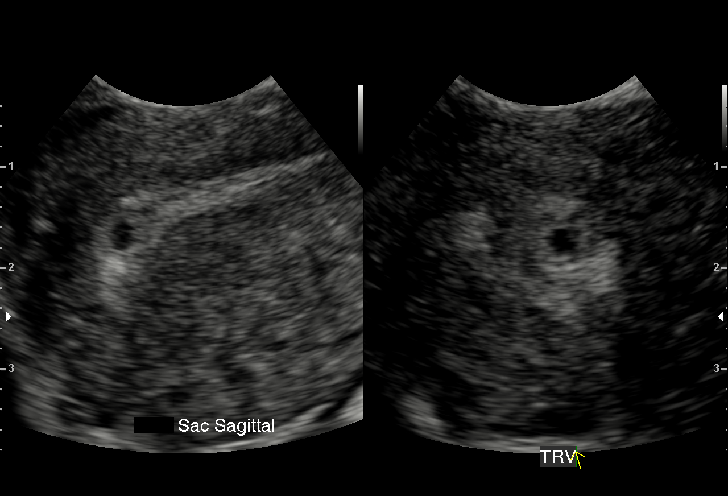
[im 53/68]
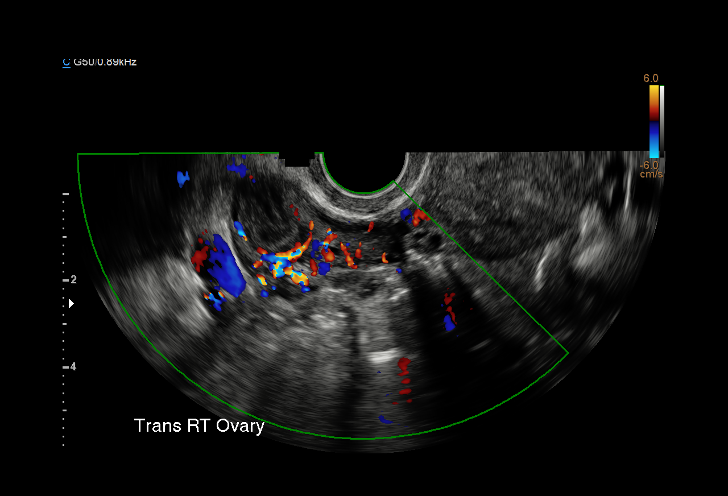
[im 58/68]
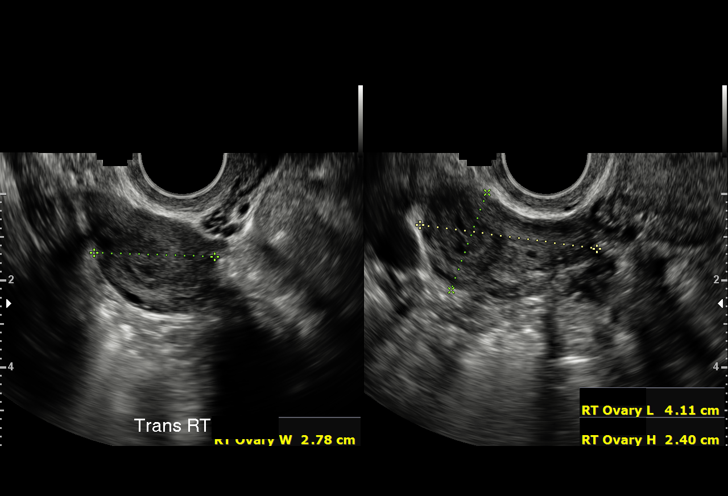
[im 63/68]
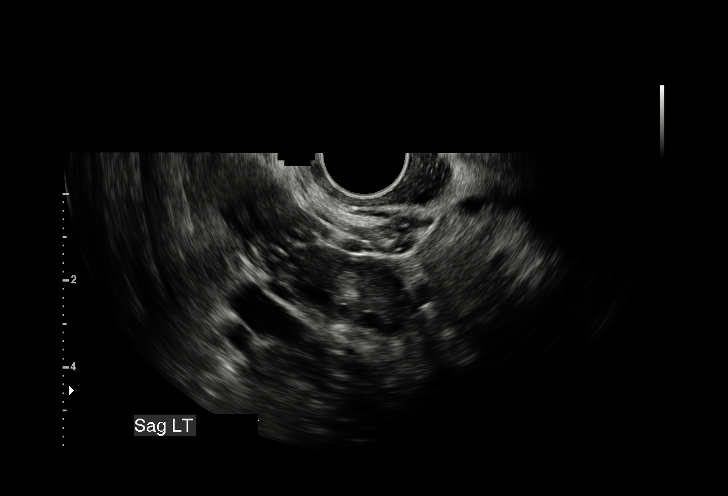
[im 68/68]
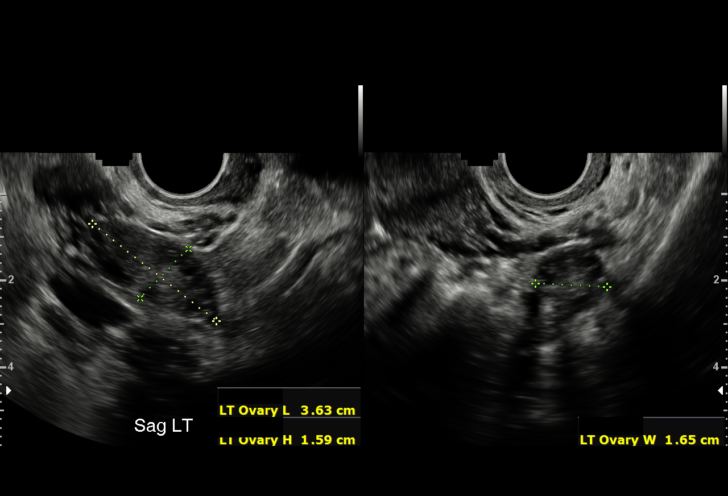

[15 of 28 positions shown; findings below may reference images not displayed]

FINDINGS: Intrauterine gestational sac: Present, single, tiny

Yolk sac:  Not identified

Embryo:  Not identified

Cardiac Activity: N/A

Heart Rate: N/A  bpm

MSD: 2.5 mm   4 w   6 d

Subchorionic hemorrhage:  None visualized.

Maternal uterus/adnexae:

RIGHT ovary measures 2.8 x 4.1 x 2.4 cm and contains a small corpus
luteum.

LEFT ovary normal size and morphology 3.6 x 1.6 x 1.7 cm.

Maternal uterus anteverted and otherwise unremarkable.

No free pelvic fluid or adnexal masses.
IMPRESSION: Tiny gestational sac in uterus with mean sac diameter corresponding
to 4 weeks 6 days EGA.

No fetal pole identified to establish viability; if clinically
indicated, follow-up imaging can be performed in 14 days to assess
viability.

## 2022-03-01 DIAGNOSIS — R0602 Shortness of breath: Secondary | ICD-10-CM | POA: Diagnosis not present

## 2022-03-01 DIAGNOSIS — U071 COVID-19: Secondary | ICD-10-CM | POA: Diagnosis not present

## 2022-03-01 DIAGNOSIS — R0981 Nasal congestion: Secondary | ICD-10-CM | POA: Diagnosis not present

## 2022-03-06 IMAGING — US US OB TRANSVAGINAL
1 series · 15 of 28 positions shown · non-contrast
Comparison: 11/08/2019

CLINICAL DATA: Spotting

EXAM:
TRANSVAGINAL OB ULTRASOUND
TECHNIQUE: Transvaginal ultrasound was performed for complete evaluation of the
gestation as well as the maternal uterus, adnexal regions, and
pelvic cul-de-sac.

[Series 1: us ob transvaginal · 15 of 45 slices shown]
[im 1/45]
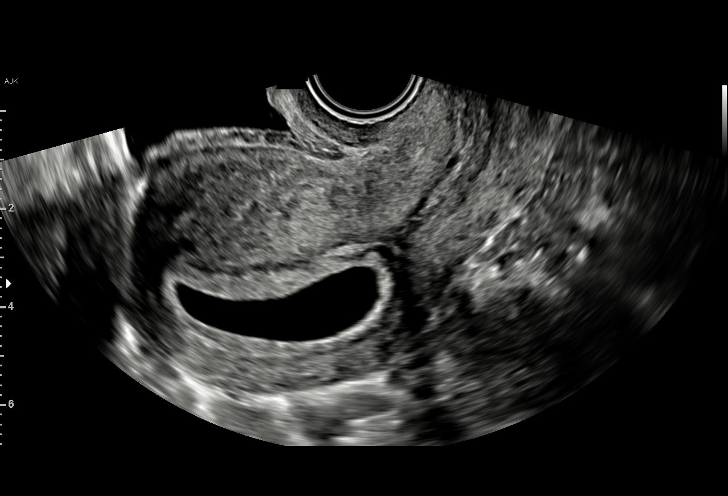
[im 4/45]
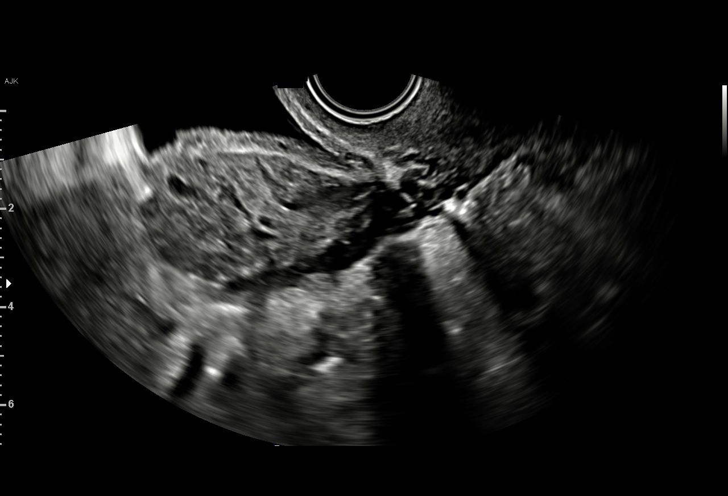
[im 7/45]
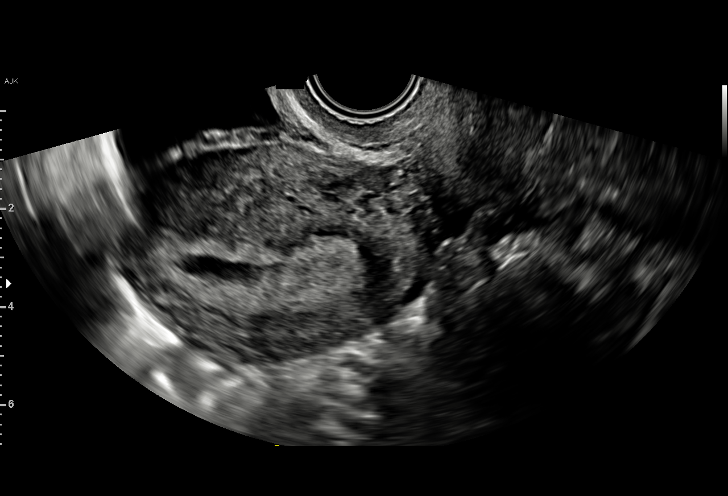
[im 10/45]
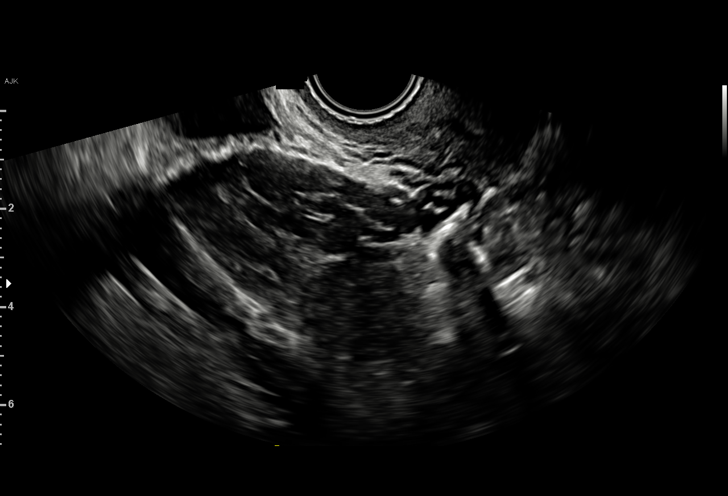
[im 14/45]
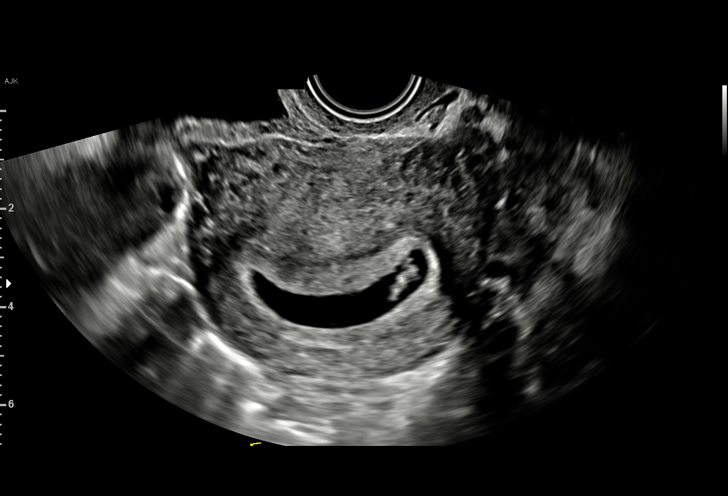
[im 17/45]
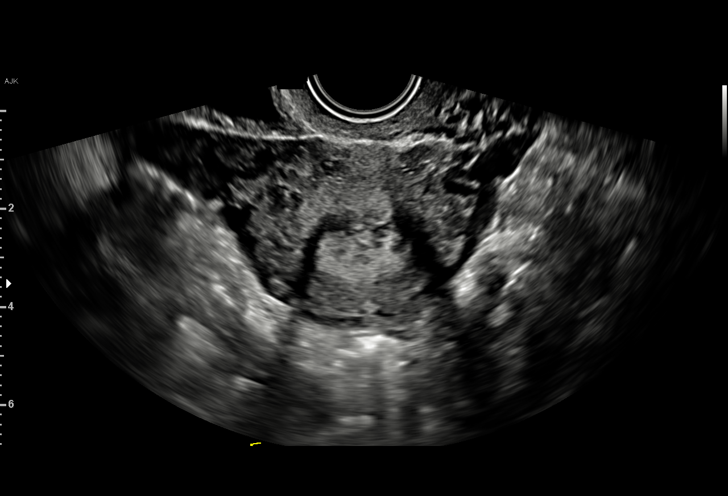
[im 20/45]
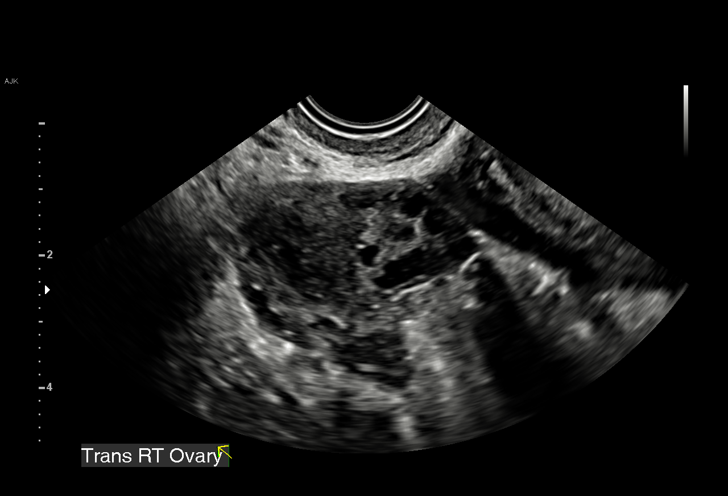
[im 23/45]
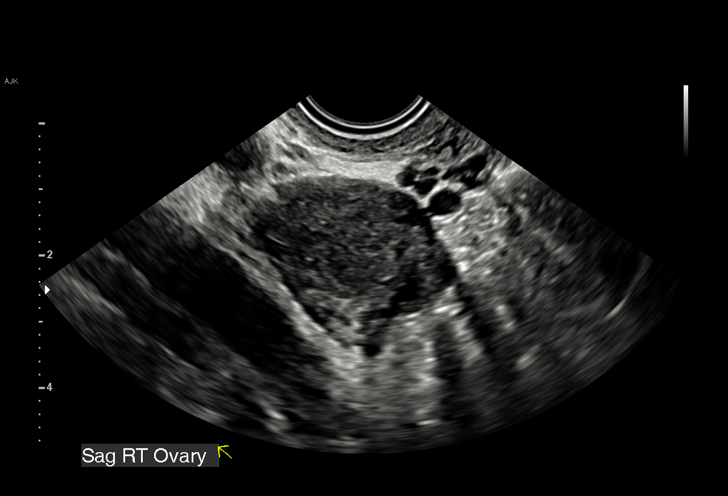
[im 25/45]
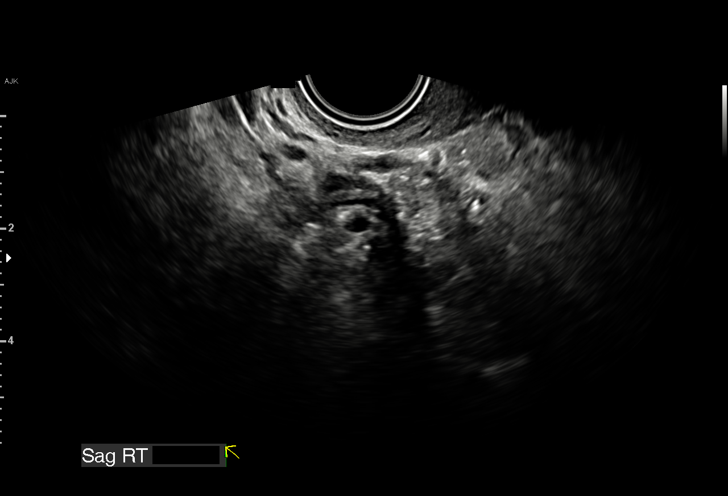
[im 28/45]
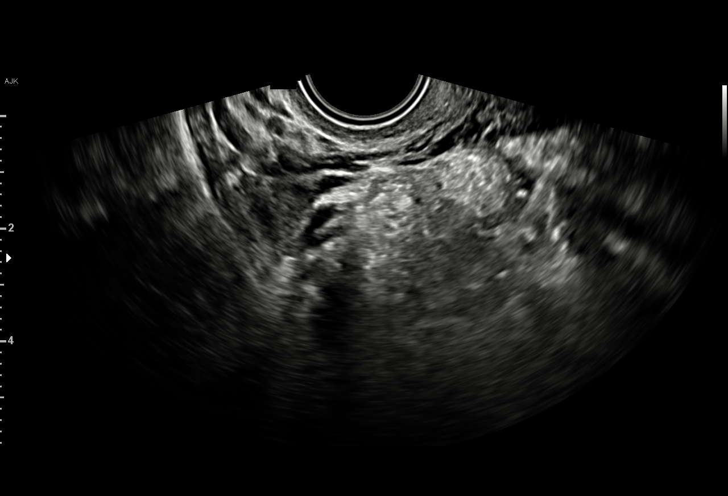
[im 31/45]
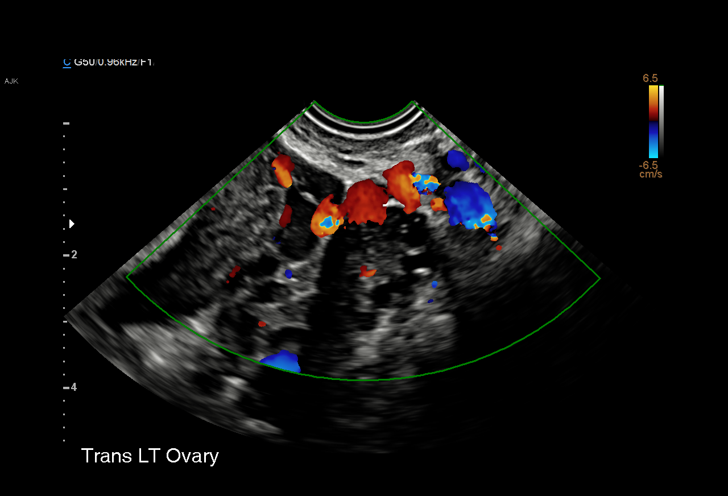
[im 35/45]
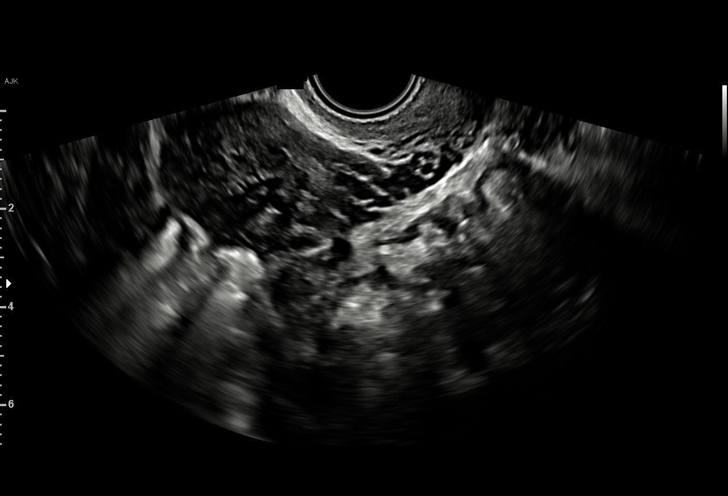
[im 38/45]
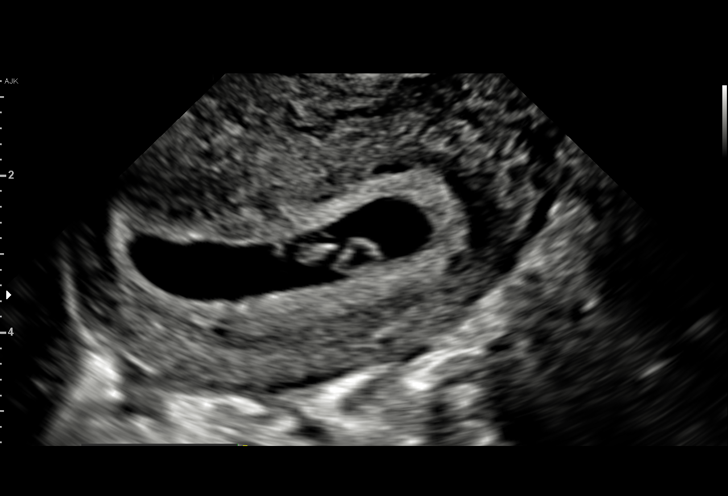
[im 41/45]
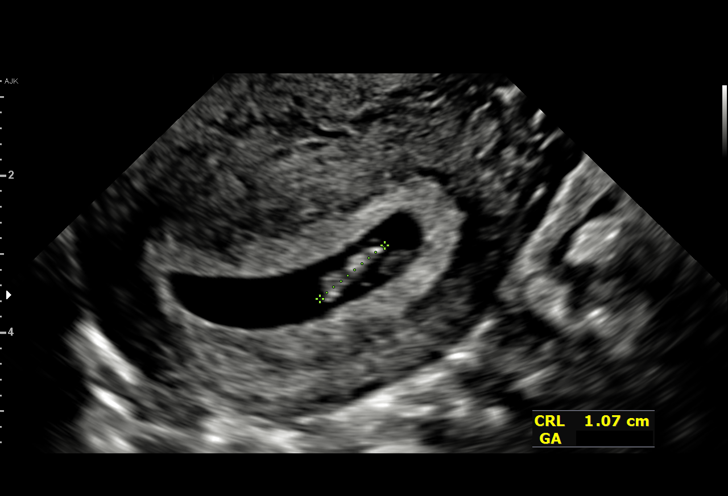
[im 45/45]
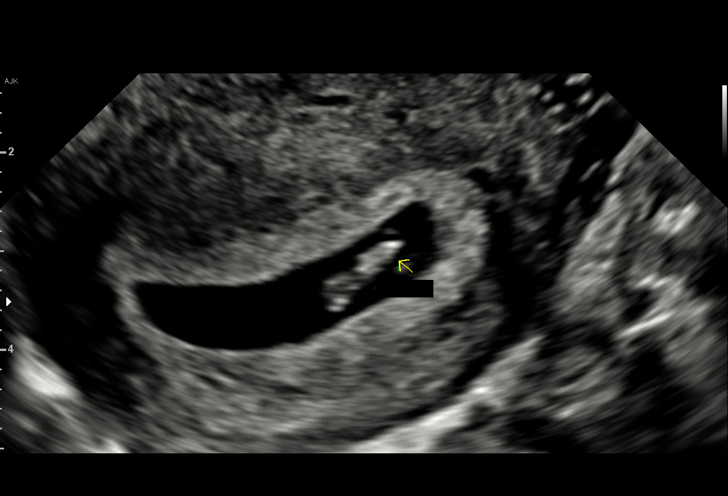

[15 of 28 positions shown; findings below may reference images not displayed]

FINDINGS: Intrauterine gestational sac: Single

Yolk sac:  Visualized.

Embryo:  Visualized.

Cardiac Activity: Visualized.

Heart Rate: 138 bpm

CRL: 10.7 mm   7 w 1 d                  US EDC: 07/14/2020

Subchorionic hemorrhage:  None visualized.

Maternal uterus/adnexae: Ovaries are within normal limits. The left
ovary measures 3.6 x 1.8 x 2.1 cm. The right ovary measures 3.1 x
2.2 x 3.5 cm and contains a corpus luteum. No significant free fluid
IMPRESSION: 1. Single viable intrauterine pregnancy with visualized embryo and
cardiac activity as above.
2. Otherwise negative examination

## 2022-03-15 ENCOUNTER — Ambulatory Visit: Payer: Medicaid Other | Admitting: Nurse Practitioner

## 2022-03-15 ENCOUNTER — Encounter: Payer: Self-pay | Admitting: Nurse Practitioner

## 2022-03-15 VITALS — BP 110/78 | HR 114 | Ht 60.0 in | Wt 146.4 lb

## 2022-03-15 DIAGNOSIS — F988 Other specified behavioral and emotional disorders with onset usually occurring in childhood and adolescence: Secondary | ICD-10-CM

## 2022-03-15 DIAGNOSIS — M797 Fibromyalgia: Secondary | ICD-10-CM

## 2022-03-15 DIAGNOSIS — Z6828 Body mass index (BMI) 28.0-28.9, adult: Secondary | ICD-10-CM | POA: Diagnosis not present

## 2022-03-15 MED ORDER — AMPHETAMINE-DEXTROAMPHETAMINE 10 MG PO TABS
10.0000 mg | ORAL_TABLET | Freq: Every evening | ORAL | 0 refills | Status: DC
Start: 2022-03-15 — End: 2022-04-15

## 2022-03-15 MED ORDER — AMPHETAMINE-DEXTROAMPHETAMINE 15 MG PO TABS: 15.0000 mg | ORAL_TABLET | Freq: Every morning | ORAL | 0 refills | Status: DC

## 2022-03-15 NOTE — Patient Instructions (Signed)
Attention Deficit Hyperactivity Disorder, Adult Attention deficit hyperactivity disorder (ADHD) is a mental health disorder that starts during childhood. For many people with ADHD, the disorder continues into the adult years. Treatment can help you manage your symptoms. There are three main types of ADHD: Inattentive. With this type, adults have difficulty paying attention. This may affect cognitive abilities. Hyperactive-impulsive. With this type, adults have a lot of energy and have difficulty controlling their behavior. Combination type. Some people may have symptoms of both types. What are the causes? The exact cause of ADHD is not known. Most experts believe a person's genes and environment possibly contribute to ADHD. What increases the risk? The following factors may make you more likely to develop this condition: Having a first-degree relative such as a parent, brother, or sister, with the condition. Being born before 37 weeks of pregnancy (prematurely) or at a low birth weight. Being born to a mother who smoked tobacco or drank alcohol during pregnancy. Having experienced a brain injury. Being exposed to lead or other toxins in the womb or early in life. What are the signs or symptoms? Symptoms of this condition depend on the type of ADHD. Symptoms of the inattentive type include: Difficulty paying attention or following instructions. Often making simple mistakes. Being disorganized. Avoiding tasks that require time and attention. Losing and forgetting things. Symptoms of the hyperactive-impulsive type include: Restlessness. Talking out of turn, interrupting others, or talking too much. Difficulty with: Sitting still. Feeling motivated. Relaxing. Waiting in line or waiting for a turn. People with the combination type have symptoms of both of the other types. In adults, this condition may lead to certain problems, such as: Keeping jobs. Performing tasks at work. Having  stable relationships. Being on time or keeping to a schedule. How is this diagnosed? This condition is diagnosed based on your current symptoms and your history of symptoms. The diagnosis can be made by a health care provider such as a primary care provider or a mental health care specialist. Your health care provider may use a symptom checklist or a behavior rating scale to evaluate your symptoms. Your health care provider may also want to talk with people who have observed your behaviors throughout your life. How is this treated? This condition can be treated with medicines and behavior therapy. Medicines may be the best option to reduce impulsive behaviors and improve attention. Your health care provider may recommend: Stimulant medicines. These are the most common medicines used for adult ADHD. They affect certain chemicals in the brain (neurotransmitters) and improve your ability to control your symptoms. A non-stimulant medicine. These medicines can also improve focus, attention, and impulsive behavior. It may take weeks to months to see the effects of this medicine. Counseling and behavioral management are also important for treating ADHD. Counseling is often used along with medicine. Your health care provider may suggest: Cognitive behavioral therapy (CBT). This type of therapy teaches you to replace negative thoughts and actions with positive thoughts and actions. When used as part of ADHD treatment, this therapy may also include: Coping strategies for organization, time management, impulse control, and stress reduction. Mindfulness and meditation training. Behavioral management. You may work with a coach who is specially trained to help people with ADHD manage and organize activities and function more effectively. Follow these instructions at home: Medicines  Take over-the-counter and prescription medicines only as told by your health care provider. Talk with your health care provider  about the possible side effects of your medicines and   how to manage them. Alcohol use Do not drink alcohol if: Your health care provider tells you not to drink. You are pregnant, may be pregnant, or are planning to become pregnant. If you drink alcohol: Limit how much you use to: 0-1 drink a day for women. 0-2 drinks a day for men. Know how much alcohol is in your drink. In the [place] one drink equals one 12 oz bottle of beer (355 mL), one 5 oz glass of wine (148 mL), or one 1 oz glass of hard liquor (44 mL). Lifestyle  Do not use illegal drugs. Get enough sleep. Eat a healthy diet. Exercise regularly. Exercise can help to reduce stress and anxiety. General instructions Learn as much as you can about adult ADHD, and work closely with your health care providers to find the treatments that work best for you. Follow the same schedule each day. Use reminder devices like notes, calendars, and phone apps to stay on time and organized. Keep all follow-up visits. Your health care provider will need to monitor your condition and adjust your treatment over time. Where to find more information A health care provider may be able to recommend resources that are available online or over the phone. You could start with: Attention Deficit Disorder Association (ADDA): add.org National Institute of Mental Health (NIMH): nimh.nih.gov Contact a health care provider if: Your symptoms continue to cause problems. You have side effects from your medicine, such as: Repeated muscle twitches, coughing, or speech outbursts. Sleep problems. Loss of appetite. Dizziness. Unusually fast heartbeat. Stomach pains. Headaches. You are struggling with anxiety, depression, or substance abuse. Get help right away if: You have a severe reaction to a medicine. This symptom may be an emergency. Get help right away. Call 911. Do not wait to see if the symptom will go away. Do not drive yourself to the hospital. Take  one of these steps if you feel like you may hurt yourself or others, or have thoughts about taking your own life: Go to your nearest emergency room. Call 911. Call the National Suicide Prevention Lifeline at 1-800-273-8255 or 988. This is open 24 hours a day Text the Crisis Text Line at 741741. Summary ADHD is a mental health disorder that starts during childhood and often continues into your adult years. The exact cause of ADHD is not known. Most experts believe genetics and environmental factors contribute to ADHD. There is no cure for ADHD, but treatment with medicine, cognitive behavioral therapy, or behavioral management can help you manage your condition. This information is not intended to replace advice given to you by your health care provider. Make sure you discuss any questions you have with your health care provider. Document Revised: 06/18/2021 Document Reviewed: 06/18/2021 Elsevier Patient Education  2023 Elsevier Inc.  

## 2022-03-15 NOTE — Progress Notes (Signed)
I,Victoria T Hamilton,acting as a Education administrator for Minette Brine, FNP.,have documented all relevant documentation on the behalf of Minette Brine, FNP,as directed by  Minette Brine, FNP while in the presence of Minette Brine, [place].    Subjective:     Patient ID: Eileen Abbott , female    DOB: [birth year] , 32 y.o.   MRN: [id number]   Chief Complaint  Patient presents with   ADHD    HPI  Patient is here for follow up on ADHD and fibromyalgia. She has been doing well with taking her Cymbalta. Denies family history of breast lumps.     Past Medical History:  Diagnosis Date   Asthma    Attention deficit disorder    Fibromyalgia      Family History  Problem Relation Age of Onset   Diabetes Mother    Arthritis Mother    Fibromyalgia Mother    Irritable bowel syndrome Father    Vitiligo Father    Prostate cancer Father    Asthma Sister    Hypertension Maternal Grandmother    Arthritis Maternal Grandmother    Fibromyalgia Maternal Grandmother    Irritable bowel syndrome Maternal Grandfather    Cancer Paternal Grandmother    Cancer Paternal Grandfather      Current Outpatient Medications:    albuterol (VENTOLIN HFA) 108 (90 Base) MCG/ACT inhaler, Inhale 2 puffs into the lungs every 6 (six) hours as needed for wheezing or shortness of breath., Disp: 8 g, Rfl: 2   DULoxetine (CYMBALTA) 30 MG capsule, TAKE 1 CAPSULE BY MOUTH EVERY DAY, Disp: 90 capsule, Rfl: 1   hydrocortisone cream 0.5 %, Apply to area twice a day as needed, Disp: 30 g, Rfl: 0   norgestimate-ethinyl estradiol (ORTHO-CYCLEN) 0.25-35 MG-MCG tablet, Take 1 tablet by mouth daily., Disp: 28 tablet, Rfl: 11   amphetamine-dextroamphetamine (ADDERALL) 10 MG tablet, Take 1 tablet (10 mg total) by mouth every evening., Disp: 30 tablet, Rfl: 0   amphetamine-dextroamphetamine (ADDERALL) 15 MG tablet, Take 1 tablet by mouth every morning, Disp: 30 tablet, Rfl: 0   Allergies  Allergen Reactions   Zofran [Ondansetron] Shortness Of  Breath   Banana Nausea And Vomiting   Eggs Or Egg-Derived Products Nausea And Vomiting     Review of Systems  Constitutional: Negative.   Respiratory: Negative.    Cardiovascular: Negative.   Neurological: Negative.   Psychiatric/Behavioral: Negative.       Today's Vitals   03/15/22 1556  BP: 110/78  Pulse: (!) 114  SpO2: 98%  Weight: 146 lb 6.4 oz (66.4 kg)  Height: 5' (1.524 m)   Body mass index is 28.59 kg/m.  Wt Readings from Last 3 Encounters:  03/15/22 146 lb 6.4 oz (66.4 kg)  12/01/21 131 lb 9.6 oz (59.7 kg)  09/02/21 127 lb (57.6 kg)    Objective:  Physical Exam Constitutional:      General: She is not in acute distress.    Appearance: Normal appearance.  Cardiovascular:     Rate and Rhythm: Normal rate and regular rhythm.     Pulses: Normal pulses.     Heart sounds: Normal heart sounds. No murmur heard. Pulmonary:     Effort: Pulmonary effort is normal. No respiratory distress.     Breath sounds: Normal breath sounds.  Skin:    General: Skin is warm and dry.     Capillary Refill: Capillary refill takes less than 2 seconds.  Neurological:     General: No focal deficit present.  Mental Status: She is alert and oriented to person, place, and time.     Cranial Nerves: No cranial nerve deficit.     Motor: No weakness.         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 - amphetamine-dextroamphetamine (ADDERALL) 10 MG tablet; Take 1 tablet (10 mg total) by mouth every evening.  Dispense: 30 tablet; Refill: 0 - amphetamine-dextroamphetamine (ADDERALL) 15 MG tablet; Take 1 tablet by mouth every morning  Dispense: 30 tablet; Refill: 0  2. Fibromyalgia Comments: Currently controlled.  3. Body mass index (BMI) of 28.0 to 28.9 in adult     Patient was given opportunity to ask questions. Patient verbalized understanding of the plan and was able to repeat key  elements of the plan. All questions were answered to their satisfaction.  Minette Brine, FNP   I, Minette Brine, FNP, have reviewed all documentation for this visit. The documentation on 03/15/22 for the exam, diagnosis, procedures, and orders are all accurate and complete.   IF YOU HAVE BEEN REFERRED TO A SPECIALIST, IT MAY TAKE 1-2 WEEKS TO SCHEDULE/PROCESS THE REFERRAL. IF YOU HAVE NOT HEARD FROM US/SPECIALIST IN TWO WEEKS, PLEASE GIVE [place] A CALL AT [phone] X [phone].   THE PATIENT IS ENCOURAGED TO PRACTICE SOCIAL DISTANCING DUE TO THE COVID-19 PANDEMIC.

## 2022-04-15 ENCOUNTER — Other Ambulatory Visit: Payer: Self-pay | Admitting: Nurse Practitioner

## 2022-04-15 DIAGNOSIS — R21 Rash and other nonspecific skin eruption: Secondary | ICD-10-CM

## 2022-04-15 DIAGNOSIS — F988 Other specified behavioral and emotional disorders with onset usually occurring in childhood and adolescence: Secondary | ICD-10-CM

## 2022-04-15 MED ORDER — HYDROCORTISONE 0.5 % EX CREA: TOPICAL_CREAM | CUTANEOUS | 0 refills | Status: AC

## 2022-04-17 MED ORDER — AMPHETAMINE-DEXTROAMPHETAMINE 15 MG PO TABS: 15.0000 mg | ORAL_TABLET | Freq: Every morning | ORAL | 0 refills | Status: DC

## 2022-04-17 MED ORDER — AMPHETAMINE-DEXTROAMPHETAMINE 10 MG PO TABS: 10.0000 mg | ORAL_TABLET | Freq: Every evening | ORAL | 0 refills | Status: DC

## 2022-04-25 ENCOUNTER — Other Ambulatory Visit: Payer: Self-pay | Admitting: Family Medicine

## 2022-04-25 DIAGNOSIS — Z3009 Encounter for other general counseling and advice on contraception: Secondary | ICD-10-CM

## 2022-05-12 ENCOUNTER — Telehealth: Payer: Self-pay | Admitting: Family Medicine

## 2022-05-12 DIAGNOSIS — Z3009 Encounter for other general counseling and advice on contraception: Secondary | ICD-10-CM

## 2022-05-12 MED ORDER — NORGESTIMATE-ETH ESTRADIOL 0.25-35 MG-MCG PO TABS: 1.0000 | ORAL_TABLET | Freq: Every day | ORAL | 2 refills | Status: DC

## 2022-05-12 NOTE — Telephone Encounter (Signed)
Called pt and discussed her concern. She stated that she ran out of pills 2 weeks ago. She is not currently sexually active. I advised that I will send in Rx for 3 months of pills. She should begin taking on first Eileen Abbott of next period. She also should use condoms if she resumes sexual activity until the next pack of pills is completed. She voiced understanding. Pt has Annual Gyn exam scheduled on 4/4.

## 2022-05-12 NOTE — Telephone Encounter (Signed)
Rx for [employer] Estradiol

## 2022-05-16 ENCOUNTER — Other Ambulatory Visit: Payer: Self-pay | Admitting: Nurse Practitioner

## 2022-05-16 DIAGNOSIS — F988 Other specified behavioral and emotional disorders with onset usually occurring in childhood and adolescence: Secondary | ICD-10-CM

## 2022-05-16 MED ORDER — AMPHETAMINE-DEXTROAMPHETAMINE 15 MG PO TABS: 15.0000 mg | ORAL_TABLET | Freq: Every morning | ORAL | 0 refills | Status: DC

## 2022-05-16 MED ORDER — AMPHETAMINE-DEXTROAMPHETAMINE 10 MG PO TABS: 10.0000 mg | ORAL_TABLET | Freq: Every evening | ORAL | 0 refills | Status: DC

## 2022-06-09 HISTORY — PX: REFRACTIVE SURGERY: SHX103

## 2022-06-14 ENCOUNTER — Ambulatory Visit: Payer: BC Managed Care – PPO | Admitting: Nurse Practitioner

## 2022-06-14 ENCOUNTER — Encounter: Payer: Self-pay | Admitting: Nurse Practitioner

## 2022-06-14 VITALS — BP 112/58 | HR 111 | Temp 98.0°F | Ht 60.0 in | Wt 149.4 lb

## 2022-06-14 DIAGNOSIS — F988 Other specified behavioral and emotional disorders with onset usually occurring in childhood and adolescence: Secondary | ICD-10-CM

## 2022-06-14 DIAGNOSIS — Z79899 Other long term (current) drug therapy: Secondary | ICD-10-CM | POA: Diagnosis not present

## 2022-06-14 DIAGNOSIS — Z23 Encounter for immunization: Secondary | ICD-10-CM

## 2022-06-14 DIAGNOSIS — M797 Fibromyalgia: Secondary | ICD-10-CM | POA: Diagnosis not present

## 2022-06-14 MED ORDER — DULOXETINE HCL 30 MG PO CPEP
ORAL_CAPSULE | ORAL | 1 refills | Status: DC
Start: 2022-06-14 — End: 2023-02-15

## 2022-06-14 MED ORDER — AMPHETAMINE-DEXTROAMPHETAMINE 10 MG PO TABS
10.0000 mg | ORAL_TABLET | Freq: Every evening | ORAL | 0 refills | Status: DC
Start: 2022-06-14 — End: 2022-07-20

## 2022-06-14 MED ORDER — AMPHETAMINE-DEXTROAMPHETAMINE 15 MG PO TABS: 15.0000 mg | ORAL_TABLET | Freq: Every morning | ORAL | 0 refills | Status: DC

## 2022-06-14 NOTE — Progress Notes (Signed)
I,Sheena H Holbrook,acting as a Neurosurgeon for Arnette Felts, FNP.,have documented all relevant documentation on the behalf of Arnette Felts, FNP,as directed by  Arnette Felts, FNP while in the presence of Arnette Felts, FNP.    Subjective:     Patient ID: Eileen Abbott , female    DOB: [birth year] , 32 y.o.   MRN: [id number]   Chief Complaint  Patient presents with   Medical Management of Chronic Issues    HPI  Patient presents today for ADD follow up.       Past Medical History:  Diagnosis Date   Asthma    Attention deficit disorder    Fibromyalgia      Family History  Problem Relation Age of Onset   Diabetes Mother    Arthritis Mother    Fibromyalgia Mother    Irritable bowel syndrome Father    Vitiligo Father    Prostate cancer Father    Asthma Sister    Hypertension Maternal Grandmother    Arthritis Maternal Grandmother    Fibromyalgia Maternal Grandmother    Irritable bowel syndrome Maternal Grandfather    Cancer Paternal Grandmother    Cancer Paternal Grandfather      Current Outpatient Medications:    zolpidem (AMBIEN) 10 MG tablet, Take 10 mg by mouth at bedtime., Disp: , Rfl:    albuterol (VENTOLIN HFA) 108 (90 Base) MCG/ACT inhaler, Inhale 2 puffs into the lungs every 6 (six) hours as needed for wheezing or shortness of breath., Disp: 8 g, Rfl: 2   amphetamine-dextroamphetamine (ADDERALL) 10 MG tablet, Take 1 tablet (10 mg total) by mouth every evening., Disp: 30 tablet, Rfl: 0   amphetamine-dextroamphetamine (ADDERALL) 15 MG tablet, Take 1 tablet by mouth every morning, Disp: 30 tablet, Rfl: 0   DULoxetine (CYMBALTA) 30 MG capsule, TAKE 1 CAPSULE BY MOUTH EVERY DAY, Disp: 90 capsule, Rfl: 1   hydrocortisone cream 0.5 %, Apply to area twice a day as needed, Disp: 30 g, Rfl: 0   Norethindrone Acetate-Ethinyl Estrad-FE (LOESTRIN 24 FE) 1-20 MG-MCG(24) tablet, Take 1 tablet by mouth daily., Disp: 28 tablet, Rfl: 11   Allergies  Allergen Reactions   Zofran  [Ondansetron] Shortness Of Breath   Banana Nausea And Vomiting   Egg-Derived Products Nausea And Vomiting     Review of Systems  Constitutional: Negative.   Respiratory: Negative.    Cardiovascular: Negative.   Neurological: Negative.   Psychiatric/Behavioral: Negative.       Today's Vitals   06/14/22 1636  BP: (!) 112/58  Pulse: (!) 111  Temp: 98 F (36.7 C)  TempSrc: Oral  SpO2: 99%  Weight: 149 lb 6.4 oz (67.8 kg)  Height: 5' (1.524 m)   Body mass index is 29.18 kg/m.   Objective:  Physical Exam Vitals reviewed.  Constitutional:      General: She is not in acute distress.    Appearance: Normal appearance.  Cardiovascular:     Rate and Rhythm: Normal rate and regular rhythm.     Pulses: Normal pulses.     Heart sounds: Normal heart sounds. No murmur heard. Pulmonary:     Effort: Pulmonary effort is normal. No respiratory distress.     Breath sounds: Normal breath sounds.  Skin:    General: Skin is warm and dry.     Capillary Refill: Capillary refill takes less than 2 seconds.  Neurological:     General: No focal deficit present.     Mental Status: She is alert and oriented to  person, place, and time.     Cranial Nerves: No cranial nerve deficit.     Motor: No weakness.         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 - amphetamine-dextroamphetamine (ADDERALL) 10 MG tablet; Take 1 tablet (10 mg total) by mouth every evening.  Dispense: 30 tablet; Refill: 0 - amphetamine-dextroamphetamine (ADDERALL) 15 MG tablet; Take 1 tablet by mouth every morning  Dispense: 30 tablet; Refill: 0 - CMP14+EGFR  2. Fibromyalgia Comments: Stable, Controlled with Cymbalta - DULoxetine (CYMBALTA) 30 MG capsule; TAKE 1 CAPSULE BY MOUTH EVERY DAY  Dispense: 90 capsule; Refill: 1  3. Need for COVID-19 vaccine Covid 19 vaccine given in office observed for 15 minutes without any  adverse reaction - Pfizer Fall 2023 Covid-19 Vaccine 47yrs and older  4. Other long term (current) drug therapy - CMP14+EGFR     Patient was given opportunity to ask questions. Patient verbalized understanding of the plan and was able to repeat key elements of the plan. All questions were answered to their satisfaction.  Arnette Felts, FNP   I, Arnette Felts, FNP, have reviewed all documentation for this visit. The documentation on 06/14/22 for the exam, diagnosis, procedures, and orders are all accurate and complete.   IF YOU HAVE BEEN REFERRED TO A SPECIALIST, IT MAY TAKE 1-2 WEEKS TO SCHEDULE/PROCESS THE REFERRAL. IF YOU HAVE NOT HEARD FROM US/SPECIALIST IN TWO WEEKS, PLEASE GIVE [place] A CALL AT [phone].   THE PATIENT IS ENCOURAGED TO PRACTICE SOCIAL DISTANCING DUE TO THE COVID-19 PANDEMIC.

## 2022-06-15 LAB — CMP14+EGFR
ALT: 25 IU/L (ref 0–32)
AST: 22 IU/L (ref 0–40)
Albumin/Globulin Ratio: 1.6 (ref 1.2–2.2)
Albumin: 4.5 g/dL (ref 3.9–4.9)
Alkaline Phosphatase: 44 IU/L (ref 44–121)
BUN/Creatinine Ratio: 11 (ref 9–23)
BUN: 10 mg/dL (ref 6–20)
Bilirubin Total: <0.2 mg/dL (ref 0.0–1.2)
CO2: 25 mmol/L (ref 20–29)
Calcium: 9.4 mg/dL (ref 8.7–10.2)
Chloride: 102 mmol/L (ref 96–106)
Creatinine, Ser: 0.9 mg/dL (ref 0.57–1.00)
Globulin, Total: 2.9 g/dL (ref 1.5–4.5)
Glucose: 101 mg/dL — ABNORMAL HIGH (ref 70–99)
Potassium: 4.7 mmol/L (ref 3.5–5.2)
Sodium: 140 mmol/L (ref 134–144)
Total Protein: 7.4 g/dL (ref 6.0–8.5)
eGFR: 87 mL/min/{1.73_m2} (ref >59)

## 2022-06-16 ENCOUNTER — Ambulatory Visit: Payer: Medicaid Other | Admitting: Obstetrics & Gynecology

## 2022-06-17 ENCOUNTER — Other Ambulatory Visit: Payer: Self-pay

## 2022-06-17 ENCOUNTER — Encounter: Payer: Self-pay | Admitting: Advanced Practice Midwife

## 2022-06-17 ENCOUNTER — Ambulatory Visit (INDEPENDENT_AMBULATORY_CARE_PROVIDER_SITE_OTHER): Payer: BC Managed Care – PPO | Admitting: Advanced Practice Midwife

## 2022-06-17 ENCOUNTER — Other Ambulatory Visit (HOSPITAL_COMMUNITY)
Admission: RE | Admit: 2022-06-17 | Discharge: 2022-06-17 | Disposition: A | Discharge status: Home / Self Care | Payer: BC Managed Care – PPO | Source: Ambulatory Visit | Attending: Obstetrics & Gynecology | Admitting: Obstetrics & Gynecology

## 2022-06-17 VITALS — BP 129/89 | HR 118 | Ht 60.0 in | Wt 148.0 lb

## 2022-06-17 DIAGNOSIS — Z3009 Encounter for other general counseling and advice on contraception: Secondary | ICD-10-CM

## 2022-06-17 DIAGNOSIS — R87610 Atypical squamous cells of undetermined significance on cytologic smear of cervix (ASC-US): Secondary | ICD-10-CM | POA: Insufficient documentation

## 2022-06-17 DIAGNOSIS — Z113 Encounter for screening for infections with a predominantly sexual mode of transmission: Secondary | ICD-10-CM | POA: Diagnosis not present

## 2022-06-17 DIAGNOSIS — Z0000 Encounter for general adult medical examination without abnormal findings: Secondary | ICD-10-CM | POA: Diagnosis not present

## 2022-06-17 MED ORDER — NORETHIN ACE-ETH ESTRAD-FE 1-20 MG-MCG(24) PO TABS: 1.0000 | ORAL_TABLET | Freq: Every day | ORAL | 11 refills | Status: DC

## 2022-06-17 NOTE — Progress Notes (Signed)
   Subjective:     Eileen Abbott is a 32 y.o. female here at [facility] for a routine exam.  Current complaints: none.  Personal health questionnaire reviewed: yes.  Do you have a primary care provider? yes Do you feel safe at home? yes  Flowsheet Row Office Visit from 06/17/2022 in [facility] [employer] [facility]  PHQ-2 Total Score 0       There are no preventive care reminders to display for this patient.   Risk factors for chronic health problems: Smoking: Alchohol/how much: Pt BMI: Body mass index is 28.9 kg/m.   Gynecologic History Patient's last menstrual period was 06/17/2022. Contraception: OCP (estrogen/progesterone) Last Pap: 2022. Results were: ASCUS, neg HPV Last mammogram: n/a.   Obstetric History OB History  Gravida Para Term Preterm AB Living  1 1 1     1   SAB IAB Ectopic Multiple Live Births        0 1    # Outcome Date GA Lbr Len/2nd Weight Sex Delivery Anes PTL Lv  1 Term 07/15/20 [redacted]w[redacted]d / 00:23 6 lb 6.3 oz (2.9 kg) M Vag-Spont None  LIV     The following portions of the patient's history were reviewed and updated as appropriate: allergies, current medications, past family history, past medical history, past social history, past surgical history, and problem list.  Review of Systems Pertinent items noted in HPI and remainder of comprehensive ROS otherwise negative.    Objective:  BP 129/89   Pulse (!) 118   Ht 5' (1.524 m)   Wt 148 lb (67.1 kg)   LMP 06/17/2022   BMI 28.90 kg/m     VS reviewed, nursing note reviewed,  Constitutional: well developed, well nourished, no distress HEENT: normocephalic, thyroid without enlargement or mass HEART: RRR, no murmurs rubs/gallops RESP: clear and equal to auscultation bilaterally in all lobes  Breast Exam:  Deferred with low risks and shared decision making, discussed recommendation to start mammogram between 40-50 yo/ Abdomen: soft Neuro: alert and oriented x 3 Skin:  warm, dry Psych: affect normal Pelvic exam: Deferred        Assessment/Plan:   1. ASCUS of cervix with negative high risk HPV --08/2020, recommend repeat in 3 years per [facility]  2. Encounter for well woman exam without gynecological exam --Overall doing well. --Low risk for breast cancer, discussed importance of self breast exams, CBE today deferred  - RPR+HBsAg+HCVAb+...  3. Routine screening for STI (sexually transmitted infection)  - RPR+HBsAg+HCVAb+... - Cervicovaginal ancillary only( [place])    4. Encounter for counseling regarding contraception --desires to continue OCPs.  Pt on estrogen pill, hx regular light periods so discussed low dose pill option. Pt to try low dose, similar monophasic with 7 day placebo week, will notify provider if problems and can switch back to Ortho Cyclen if desired.   - Norethindrone Acetate-Ethinyl Estrad-FE (LOESTRIN 24 FE) 1-20 MG-MCG(24) tablet; Take 1 tablet by mouth daily.  Dispense: 28 tablet; Refill: 11   Return in about 1 year (around 06/17/2023) for annual exam.   Sharen Counter, CNM 12:48 PM

## 2022-06-18 LAB — RPR+HBSAG+HCVAB+...
HIV Screen 4th Generation wRfx: NONREACTIVE
Hep C Virus Ab: NONREACTIVE
Hepatitis B Surface Ag: NEGATIVE
RPR Ser Ql: NONREACTIVE

## 2022-06-20 LAB — CERVICOVAGINAL ANCILLARY ONLY
Chlamydia: NEGATIVE
Comment: NEGATIVE
Comment: NEGATIVE
Comment: NORMAL
Neisseria Gonorrhea: NEGATIVE
Trichomonas: NEGATIVE

## 2022-07-20 ENCOUNTER — Other Ambulatory Visit: Payer: Self-pay | Admitting: Nurse Practitioner

## 2022-07-20 DIAGNOSIS — F988 Other specified behavioral and emotional disorders with onset usually occurring in childhood and adolescence: Secondary | ICD-10-CM

## 2022-07-21 MED ORDER — AMPHETAMINE-DEXTROAMPHETAMINE 10 MG PO TABS: 10.0000 mg | ORAL_TABLET | Freq: Every evening | ORAL | 0 refills | Status: DC

## 2022-07-21 MED ORDER — AMPHETAMINE-DEXTROAMPHETAMINE 15 MG PO TABS: 15.0000 mg | ORAL_TABLET | Freq: Every morning | ORAL | 0 refills | Status: DC

## 2022-07-23 IMAGING — US US MFM OB FOLLOW-UP
1 series · 13 of 28 positions shown · non-contrast
Comparison: none

[Series 1: us mfm ob follow-up · 68 acquisitions, 13 frames shown]
[im 3/68]
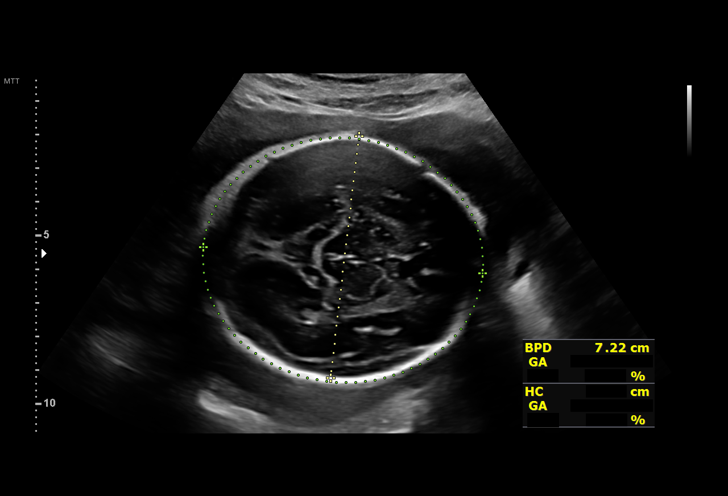
[im 8/68]
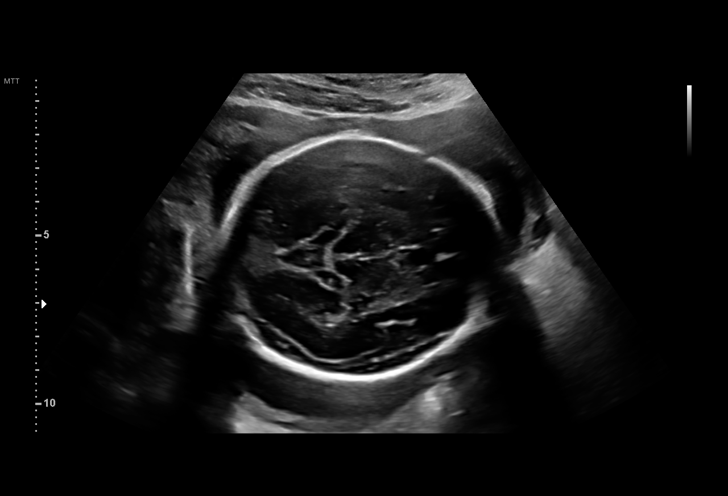
[im 13/68]
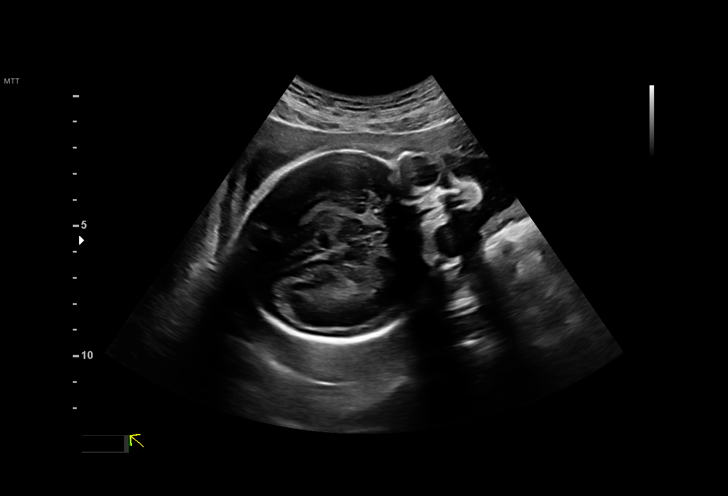
[im 18/68]
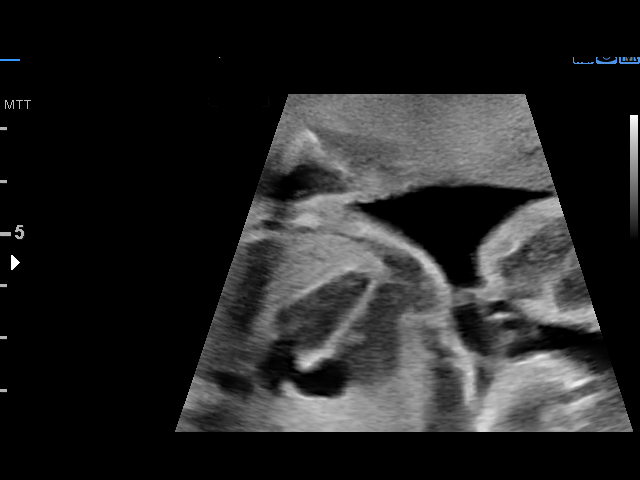
[im 23/68]
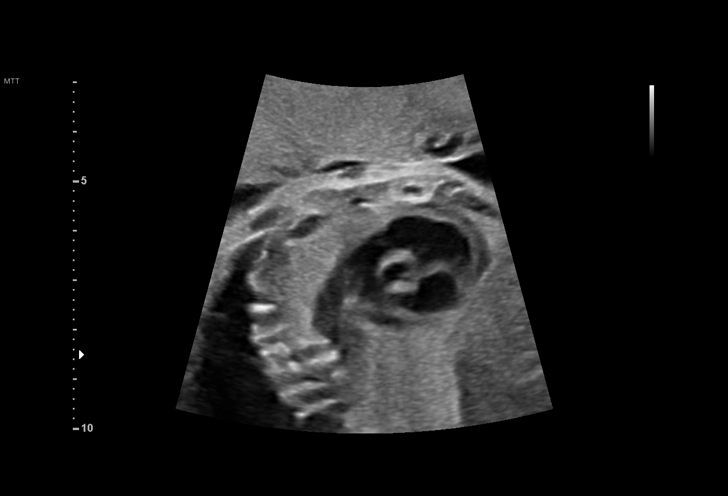
[im 28/68]
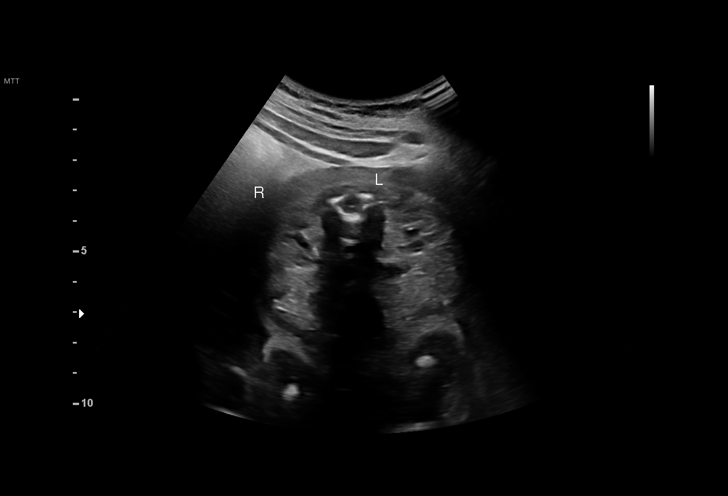
[im 35/68]
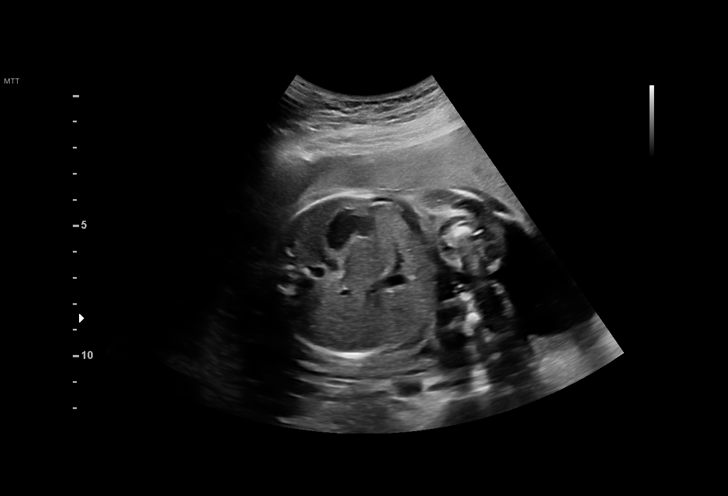
[im 40/68]
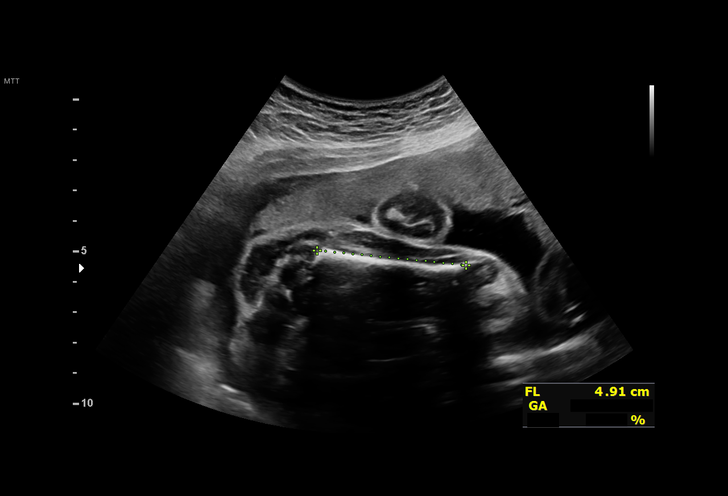
[im 45/68]
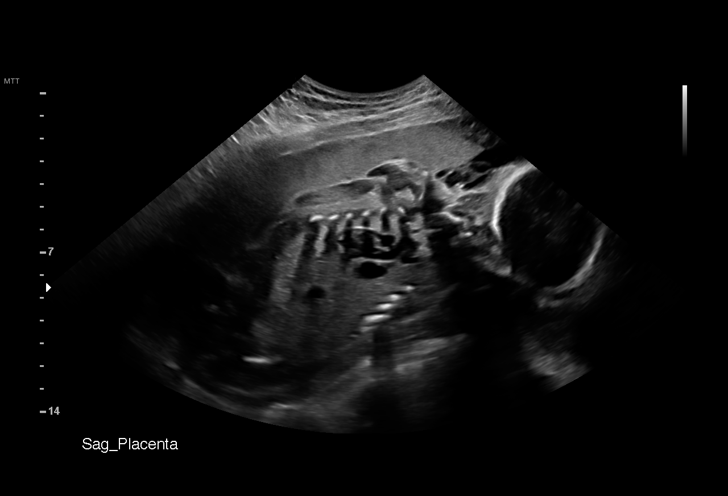
[im 50/68]
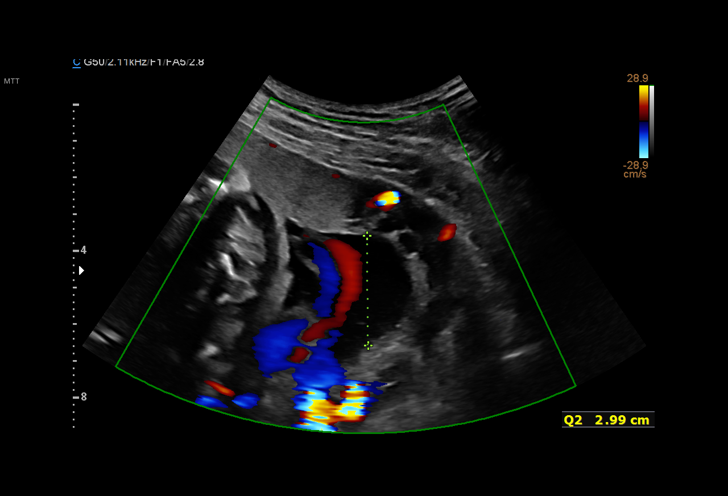
[im 55/68]
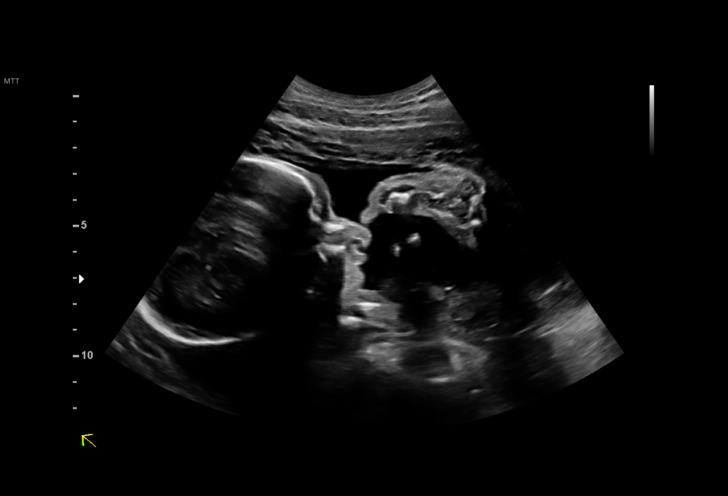
[im 60/68]
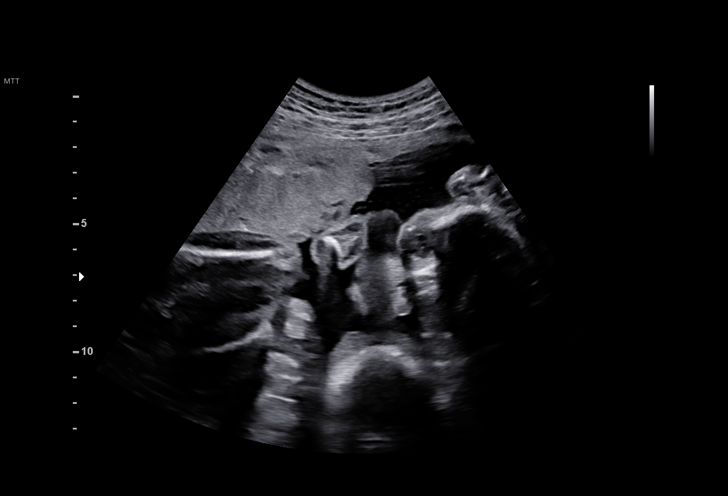
[im 65/68]
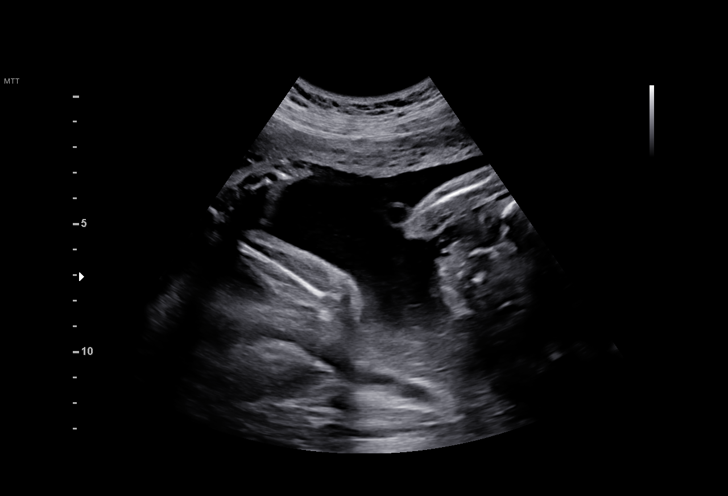

[13 of 28 positions shown; findings below may reference images not displayed]

Indications

 Marginal insertion of umbilical cord affecting
 management of mother in second trimester
 27 weeks gestation of pregnancy
 Genetic carrier (SMA)
 Rh negative state in antepartum
 LR NIPS
 Encounter for other antenatal screening
 follow-up
Fetal Evaluation

 Num Of Fetuses:         1
 Fetal Heart Rate(bpm):  144
 Cardiac Activity:       Observed
 Presentation:           Cephalic
 Placenta:               Anterior
 P. Cord Insertion:      Marginal insertion previously

 Amniotic Fluid
 AFI FV:      Within normal limits

 AFI Sum(cm)     %Tile       Largest Pocket(cm)
 12.7            33

 RUQ(cm)       RLQ(cm)       LUQ(cm)        LLQ(cm)
 2             3.1           3
Biometry
 BPD:      71.9  mm     G. Age:  28w 6d         91  %    CI:         84.8   %    70 - 86
                                                         FL/HC:      19.8   %    18.6 -
 HC:       246   mm     G. Age:  26w 5d         15  %    HC/AC:      1.09        1.05 -
 AC:      226.7  mm     G. Age:  27w 0d         43  %    FL/BPD:     67.9   %    71 - 87
 FL:       48.8  mm     G. Age:  26w 3d         19  %    FL/AC:      21.5   %    20 - 24
 CER:      30.2  mm     G. Age:  26w 2d         53  %
 LV:        6.6  mm

 Est. FW:     999  gm      2 lb 3 oz     34  %
OB History

 Gravidity:    1         Term:   0        Prem:   0        SAB:   0
 TOP:          0       Ectopic:  0        Living: 0
Gestational Age

 LMP:           28w 0d        Date:  10/01/19                 EDD:   07/07/20
 U/S Today:     27w 2d                                        EDD:   07/12/20
 Best:          27w 0d     Det. By:  Early Ultrasound         EDD:   07/14/20
                                     (11/27/19)
Anatomy

 Cranium:               Appears normal         Aortic Arch:            Appears normal
 Cavum:                 Appears normal         Ductal Arch:            Previously seen
 Ventricles:            Appears normal         Diaphragm:              Appears normal
 Choroid Plexus:        Previously seen        Stomach:                Appears normal, left
                                                                       sided
 Cerebellum:            Appears normal         Abdomen:                Previously seen
 Posterior Fossa:       Previously seen        Abdominal Wall:         Previously seen
 Nuchal Fold:           Previously seen        Cord Vessels:           Previously seen
 Face:                  Orbits and profile     Kidneys:                Appear normal
                        previously seen
 Lips:                  Previously seen        Bladder:                Appears normal
 Thoracic:              Previously seen        Spine:                  Previously seen
 Heart:                 Appears normal         Upper Extremities:      Previously seen
                        (4CH, axis, and
                        situs)
 RVOT:                  Appears normal         Lower Extremities:      Previously seen
 LVOT:                  Appears normal

 Other:  Male gender, Heels/feet, open hands/5th digits, VC, 3VV, 3VTV, and
         Lenses visualized previously. Technically difficult due to fetal position.
Cervix Uterus Adnexa

 Cervix
 Normal appearance by transabdominal scan.

 Uterus
 No abnormality visualized.

 Right Ovary
 Not visualized.
 Left Ovary
 Within normal limits.

 Cul De Sac
 No free fluid seen.

 Adnexa
 No abnormality visualized.
Comments

 This patient was seen for a follow up growth scan due to a
 marginal placental cord insertion that was noted on her prior
 ultrasound exam.  She denies any problems since her last
 exam.
 She was informed that the fetal growth and amniotic fluid
 level appears appropriate for her gestational age.
 A follow up exam was scheduled in 5 weeks.

## 2022-08-19 ENCOUNTER — Other Ambulatory Visit: Payer: Self-pay | Admitting: Nurse Practitioner

## 2022-08-19 DIAGNOSIS — F988 Other specified behavioral and emotional disorders with onset usually occurring in childhood and adolescence: Secondary | ICD-10-CM

## 2022-08-25 MED ORDER — AMPHETAMINE-DEXTROAMPHETAMINE 10 MG PO TABS: 10.0000 mg | ORAL_TABLET | Freq: Every evening | ORAL | 0 refills | Status: DC

## 2022-08-25 MED ORDER — AMPHETAMINE-DEXTROAMPHETAMINE 15 MG PO TABS: 15.0000 mg | ORAL_TABLET | Freq: Every morning | ORAL | 0 refills | Status: DC

## 2022-09-27 ENCOUNTER — Other Ambulatory Visit: Payer: Self-pay | Admitting: Nurse Practitioner

## 2022-09-27 DIAGNOSIS — F988 Other specified behavioral and emotional disorders with onset usually occurring in childhood and adolescence: Secondary | ICD-10-CM

## 2022-10-05 MED ORDER — AMPHETAMINE-DEXTROAMPHETAMINE 15 MG PO TABS: 15.0000 mg | ORAL_TABLET | Freq: Every morning | ORAL | 0 refills | Status: DC

## 2022-10-05 MED ORDER — AMPHETAMINE-DEXTROAMPHETAMINE 10 MG PO TABS: 10.0000 mg | ORAL_TABLET | Freq: Every evening | ORAL | 0 refills | Status: DC

## 2022-11-08 ENCOUNTER — Other Ambulatory Visit: Payer: Self-pay | Admitting: Nurse Practitioner

## 2022-11-08 DIAGNOSIS — F988 Other specified behavioral and emotional disorders with onset usually occurring in childhood and adolescence: Secondary | ICD-10-CM

## 2022-11-08 MED ORDER — AMPHETAMINE-DEXTROAMPHETAMINE 15 MG PO TABS: 15.0000 mg | ORAL_TABLET | Freq: Every morning | ORAL | 0 refills | Status: DC

## 2022-11-08 MED ORDER — AMPHETAMINE-DEXTROAMPHETAMINE 10 MG PO TABS: 10.0000 mg | ORAL_TABLET | Freq: Every evening | ORAL | 0 refills | Status: DC

## 2022-11-15 ENCOUNTER — Other Ambulatory Visit: Payer: Self-pay | Admitting: Nurse Practitioner

## 2022-11-15 DIAGNOSIS — Z8709 Personal history of other diseases of the respiratory system: Secondary | ICD-10-CM

## 2022-11-16 ENCOUNTER — Other Ambulatory Visit: Payer: Self-pay | Admitting: Family Medicine

## 2022-11-16 DIAGNOSIS — Z3009 Encounter for other general counseling and advice on contraception: Secondary | ICD-10-CM

## 2022-12-08 ENCOUNTER — Encounter: Payer: Medicaid Other | Admitting: Nurse Practitioner

## 2022-12-15 ENCOUNTER — Encounter: Payer: Medicaid Other | Admitting: Nurse Practitioner

## 2023-01-02 ENCOUNTER — Ambulatory Visit: Payer: BC Managed Care – PPO | Admitting: Nurse Practitioner

## 2023-01-02 ENCOUNTER — Encounter: Payer: Self-pay | Admitting: Nurse Practitioner

## 2023-01-02 VITALS — BP 120/80 | HR 107 | Temp 98.6°F | Ht 60.0 in | Wt 158.6 lb

## 2023-01-02 DIAGNOSIS — F988 Other specified behavioral and emotional disorders with onset usually occurring in childhood and adolescence: Secondary | ICD-10-CM | POA: Diagnosis not present

## 2023-01-02 DIAGNOSIS — Z23 Encounter for immunization: Secondary | ICD-10-CM

## 2023-01-02 DIAGNOSIS — Z6830 Body mass index (BMI) 30.0-30.9, adult: Secondary | ICD-10-CM

## 2023-01-02 MED ORDER — AMPHETAMINE-DEXTROAMPHETAMINE 10 MG PO TABS: 10.0000 mg | ORAL_TABLET | Freq: Every evening | ORAL | 0 refills | Status: DC

## 2023-01-02 MED ORDER — AMPHETAMINE-DEXTROAMPHETAMINE 15 MG PO TABS: 15.0000 mg | ORAL_TABLET | Freq: Every morning | ORAL | 0 refills | Status: DC

## 2023-01-02 NOTE — Assessment & Plan Note (Signed)
Influenza vaccine administered Encouraged to take Tylenol as needed for fever or muscle aches.

## 2023-01-02 NOTE — Assessment & Plan Note (Signed)
She is encouraged to strive for BMI less than 30 to decrease cardiac risk. Advised to aim for at least 150 minutes of exercise per week.

## 2023-01-02 NOTE — Assessment & Plan Note (Signed)
Overall she is doing well with current doses of Adderall.

## 2023-01-02 NOTE — Progress Notes (Signed)
Eileen Abbott, CMA,acting as a Neurosurgeon for Eileen Felts, FNP.,have documented all relevant documentation on the behalf of Eileen Felts, FNP,as directed by  Eileen Felts, FNP while in the presence of Eileen Felts, FNP.  Subjective:  Patient ID: Eileen Abbott , female    DOB: December 02, 1990 , 32 y.o.   MRN: [id number]  Chief Complaint  Patient presents with   ADHD    HPI  Patient presents today for a ADD follow up, Patient reports compliance with medication. Patient denies any chest pain, SOB, or headaches. Patient has no concerns today.   BP Readings from Last 3 Encounters: 01/02/23 : 120/80 06/17/22 : 129/89 06/14/22 : Marland Kitchen 112/58       Past Medical History:  Diagnosis Date   Asthma    Attention deficit disorder    Fibromyalgia      Family History  Problem Relation Age of Onset   Diabetes Mother    Arthritis Mother    Fibromyalgia Mother    Irritable bowel syndrome Father    Vitiligo Father    Prostate cancer Father    Asthma Sister    Hypertension Maternal Grandmother    Arthritis Maternal Grandmother    Fibromyalgia Maternal Grandmother    Irritable bowel syndrome Maternal Grandfather    Cancer Paternal Grandmother    Cancer Paternal Grandfather      Current Outpatient Medications:    DULoxetine (CYMBALTA) 30 MG capsule, TAKE 1 CAPSULE BY MOUTH EVERY DAY, Disp: 90 capsule, Rfl: 1   hydrocortisone cream 0.5 %, Apply to area twice a day as needed, Disp: 30 g, Rfl: 0   Norethindrone Acetate-Ethinyl Estrad-FE (LOESTRIN 24 FE) 1-20 MG-MCG(24) tablet, Take 1 tablet by mouth daily., Disp: 28 tablet, Rfl: 11   VENTOLIN HFA 108 (90 Base) MCG/ACT inhaler, TAKE 2 PUFFS BY MOUTH EVERY 6 HOURS AS NEEDED FOR WHEEZE OR SHORTNESS OF BREATH, Disp: 18 each, Rfl: 2   amphetamine-dextroamphetamine (ADDERALL) 10 MG tablet, Take 1 tablet (10 mg total) by mouth every evening., Disp: 30 tablet, Rfl: 0   [START ON 03/03/2023] amphetamine-dextroamphetamine (ADDERALL) 10 MG tablet, Take 1  tablet (10 mg total) by mouth every evening., Disp: 30 tablet, Rfl: 0   [START ON 02/01/2023] amphetamine-dextroamphetamine (ADDERALL) 10 MG tablet, Take 1 tablet (10 mg total) by mouth every evening., Disp: 30 tablet, Rfl: 0   [START ON 03/03/2023] amphetamine-dextroamphetamine (ADDERALL) 15 MG tablet, Take 1 tablet by mouth every morning, Disp: 30 tablet, Rfl: 0   [START ON 02/01/2023] amphetamine-dextroamphetamine (ADDERALL) 15 MG tablet, Take 1 tablet by mouth every morning, Disp: 30 tablet, Rfl: 0   amphetamine-dextroamphetamine (ADDERALL) 15 MG tablet, Take 1 tablet by mouth every morning, Disp: 30 tablet, Rfl: 0   zolpidem (AMBIEN) 10 MG tablet, Take 10 mg by mouth at bedtime. (Patient not taking: Reported on 01/02/2023), Disp: , Rfl:    Allergies  Allergen Reactions   Zofran [Ondansetron] Shortness Of Breath   Banana Nausea And Vomiting   Egg-Derived Products Nausea And Vomiting     Review of Systems  Constitutional: Negative.   HENT: Negative.    Eyes: Negative.   Respiratory: Negative.    Cardiovascular: Negative.   Gastrointestinal: Negative.   Neurological: Negative.   Psychiatric/Behavioral: Negative.       Today's Vitals   01/02/23 1502  BP: 120/80  Pulse: (!) 107  Temp: 98.6 F (37 C)  TempSrc: Oral  Weight: 158 lb 9.6 oz (71.9 kg)  Height: 5' (1.524 m)  PainSc: 0-No pain  Body mass index is 30.97 kg/m.  Wt Readings from Last 3 Encounters:  01/02/23 158 lb 9.6 oz (71.9 kg)  06/17/22 148 lb (67.1 kg)  06/14/22 149 lb 6.4 oz (67.8 kg)      Objective:  Physical Exam Vitals reviewed.  Constitutional:      General: She is not in acute distress.    Appearance: Normal appearance.  Cardiovascular:     Rate and Rhythm: Normal rate and regular rhythm.     Pulses: Normal pulses.     Heart sounds: Normal heart sounds. No murmur heard. Pulmonary:     Effort: Pulmonary effort is normal. No respiratory distress.     Breath sounds: Normal breath sounds.   Skin:    General: Skin is warm and dry.     Capillary Refill: Capillary refill takes less than 2 seconds.  Neurological:     General: No focal deficit present.     Mental Status: She is alert and oriented to person, place, and time.     Cranial Nerves: No cranial nerve deficit.     Motor: No weakness.         Assessment And Plan:  ADD (attention deficit disorder) without hyperactivity Assessment & Plan: Overall she is doing well with current doses of Adderall.   Orders: -     Amphetamine-Dextroamphetamine; Take 1 tablet (10 mg total) by mouth every evening.  Dispense: 30 tablet; Refill: 0 -     Amphetamine-Dextroamphetamine; Take 1 tablet by mouth every morning  Dispense: 30 tablet; Refill: 0 -     Amphetamine-Dextroamphetamine; Take 1 tablet by mouth every morning  Dispense: 30 tablet; Refill: 0 -     Amphetamine-Dextroamphetamine; Take 1 tablet by mouth every morning  Dispense: 30 tablet; Refill: 0 -     Amphetamine-Dextroamphetamine; Take 1 tablet (10 mg total) by mouth every evening.  Dispense: 30 tablet; Refill: 0 -     Amphetamine-Dextroamphetamine; Take 1 tablet (10 mg total) by mouth every evening.  Dispense: 30 tablet; Refill: 0  Need for influenza vaccination Assessment & Plan: Influenza vaccine administered Encouraged to take Tylenol as needed for fever or muscle aches.   Orders: -     Flu vaccine trivalent PF, 6mos and older(Flulaval,Afluria,Fluarix,Fluzone)  Body mass index (BMI) 30.0-30.9, adult Assessment & Plan: She is encouraged to strive for BMI less than 30 to decrease cardiac risk. Advised to aim for at least 150 minutes of exercise per week.     Return in about 3 months (around 04/04/2023) for 3 month HM and ADD.  Patient was given opportunity to ask questions. Patient verbalized understanding of the plan and was able to repeat key elements of the plan. All questions were answered to their satisfaction.    Eileen Sparrow, FNP, have reviewed all  documentation for this visit. The documentation on 01/02/23 for the exam, diagnosis, procedures, and orders are all accurate and complete.   IF YOU HAVE BEEN REFERRED TO A SPECIALIST, IT MAY TAKE 1-2 WEEKS TO SCHEDULE/PROCESS THE REFERRAL. IF YOU HAVE NOT HEARD FROM US/SPECIALIST IN TWO WEEKS, PLEASE GIVE [place] A CALL AT [phone] X [phone].

## 2023-02-08 ENCOUNTER — Other Ambulatory Visit: Payer: Self-pay | Admitting: Nurse Practitioner

## 2023-02-08 DIAGNOSIS — F988 Other specified behavioral and emotional disorders with onset usually occurring in childhood and adolescence: Secondary | ICD-10-CM

## 2023-02-13 ENCOUNTER — Other Ambulatory Visit: Payer: Self-pay | Admitting: Nurse Practitioner

## 2023-02-13 DIAGNOSIS — F988 Other specified behavioral and emotional disorders with onset usually occurring in childhood and adolescence: Secondary | ICD-10-CM

## 2023-02-15 ENCOUNTER — Other Ambulatory Visit: Payer: Self-pay

## 2023-02-15 ENCOUNTER — Other Ambulatory Visit: Payer: Self-pay | Admitting: Nurse Practitioner

## 2023-02-15 DIAGNOSIS — F988 Other specified behavioral and emotional disorders with onset usually occurring in childhood and adolescence: Secondary | ICD-10-CM

## 2023-02-15 DIAGNOSIS — Z8709 Personal history of other diseases of the respiratory system: Secondary | ICD-10-CM

## 2023-02-15 DIAGNOSIS — M797 Fibromyalgia: Secondary | ICD-10-CM

## 2023-02-15 MED ORDER — AMPHETAMINE-DEXTROAMPHETAMINE 15 MG PO TABS: 15.0000 mg | ORAL_TABLET | Freq: Every morning | ORAL | 0 refills | Status: DC

## 2023-02-15 MED ORDER — DULOXETINE HCL 30 MG PO CPEP: ORAL_CAPSULE | ORAL | 1 refills | Status: DC

## 2023-02-15 MED ORDER — AMPHETAMINE-DEXTROAMPHETAMINE 10 MG PO TABS: 10.0000 mg | ORAL_TABLET | Freq: Every evening | ORAL | 0 refills | Status: DC

## 2023-02-15 MED ORDER — ALBUTEROL SULFATE HFA 108 (90 BASE) MCG/ACT IN AERS: 2.0000 | INHALATION_SPRAY | Freq: Four times a day (QID) | RESPIRATORY_TRACT | 2 refills | Status: DC | PRN

## 2023-04-04 ENCOUNTER — Ambulatory Visit (INDEPENDENT_AMBULATORY_CARE_PROVIDER_SITE_OTHER): Payer: BC Managed Care – PPO | Admitting: Nurse Practitioner

## 2023-04-04 ENCOUNTER — Encounter: Payer: Self-pay | Admitting: Nurse Practitioner

## 2023-04-04 VITALS — BP 120/72 | HR 102 | Temp 98.5°F | Ht 60.0 in | Wt 161.8 lb

## 2023-04-04 DIAGNOSIS — Z0000 Encounter for general adult medical examination without abnormal findings: Secondary | ICD-10-CM | POA: Diagnosis not present

## 2023-04-04 DIAGNOSIS — M797 Fibromyalgia: Secondary | ICD-10-CM

## 2023-04-04 DIAGNOSIS — E66811 Obesity, class 1: Secondary | ICD-10-CM

## 2023-04-04 DIAGNOSIS — Z23 Encounter for immunization: Secondary | ICD-10-CM | POA: Diagnosis not present

## 2023-04-04 DIAGNOSIS — Z6831 Body mass index (BMI) 31.0-31.9, adult: Secondary | ICD-10-CM

## 2023-04-04 DIAGNOSIS — F988 Other specified behavioral and emotional disorders with onset usually occurring in childhood and adolescence: Secondary | ICD-10-CM | POA: Diagnosis not present

## 2023-04-04 DIAGNOSIS — Z79899 Other long term (current) drug therapy: Secondary | ICD-10-CM

## 2023-04-04 DIAGNOSIS — E6609 Other obesity due to excess calories: Secondary | ICD-10-CM

## 2023-04-04 DIAGNOSIS — E559 Vitamin D deficiency, unspecified: Secondary | ICD-10-CM

## 2023-04-04 NOTE — Progress Notes (Signed)
Madelaine Bhat, CMA,acting as a Neurosurgeon for Arnette Felts, FNP.,have documented all relevant documentation on the behalf of Arnette Felts, FNP,as directed by  Arnette Felts, FNP while in the presence of Arnette Felts, FNP.  Subjective:    Patient ID: Eileen Abbott , female    DOB: Aug 08, 1990 , 33 y.o.   MRN: [id number]  Chief Complaint  Patient presents with   Annual Exam    HPI  Patient presents today for HM, Patient reports compliance with medication. Patient denies any chest pain, SOB, or headaches. Patient has no concerns today.      Past Medical History:  Diagnosis Date   Asthma    Attention deficit disorder    Fibromyalgia      Family History  Problem Relation Age of Onset   Diabetes Mother    Arthritis Mother    Fibromyalgia Mother    Irritable bowel syndrome Father    Vitiligo Father    Prostate cancer Father    Asthma Sister    Hypertension Maternal Grandmother    Arthritis Maternal Grandmother    Fibromyalgia Maternal Grandmother    Irritable bowel syndrome Maternal Grandfather    Cancer Paternal Grandmother    Cancer Paternal Grandfather      Current Outpatient Medications:    albuterol (VENTOLIN HFA) 108 (90 Base) MCG/ACT inhaler, Inhale 2 puffs into the lungs every 6 (six) hours as needed for wheezing or shortness of breath., Disp: 18 each, Rfl: 2   amphetamine-dextroamphetamine (ADDERALL) 10 MG tablet, Take 1 tablet (10 mg total) by mouth every evening., Disp: 30 tablet, Rfl: 0   amphetamine-dextroamphetamine (ADDERALL) 15 MG tablet, Take 1 tablet by mouth every morning, Disp: 30 tablet, Rfl: 0   DULoxetine (CYMBALTA) 30 MG capsule, TAKE 1 CAPSULE BY MOUTH EVERY DAY, Disp: 90 capsule, Rfl: 1   hydrocortisone cream 0.5 %, Apply to area twice a day as needed, Disp: 30 g, Rfl: 0   Norethindrone Acetate-Ethinyl Estrad-FE (LOESTRIN 24 FE) 1-20 MG-MCG(24) tablet, Take 1 tablet by mouth daily., Disp: 28 tablet, Rfl: 11   Allergies  Allergen Reactions   Zofran  [Ondansetron] Shortness Of Breath   Banana Nausea And Vomiting   Egg-Derived Products Nausea And Vomiting      The patient states she uses OCP (estrogen/progesterone) for birth control. Patient's last menstrual period was 03/29/2023.. Negative for Dysmenorrhea and Negative for Menorrhagia. Negative for: breast discharge, breast lump(s), breast pain and breast self exam. Associated symptoms include abnormal vaginal bleeding. Pertinent negatives include abnormal bleeding (hematology), anxiety, decreased libido, depression, difficulty falling sleep, dyspareunia, history of infertility, nocturia, sexual dysfunction, sleep disturbances, urinary incontinence, urinary urgency, vaginal discharge and vaginal itching. Diet regular; she does want to eat more fruit; eats a vegetable more days than not.The patient states her exercise level is minimal with abs daily thinks she needs to add some cardio.   The patient's tobacco use is:  Social History   Tobacco Use  Smoking Status Former   Current packs/day: 0.50   Average packs/day: 0.5 packs/day for 7.0 years (3.5 ttl pk-yrs)   Types: Cigarettes  Smokeless Tobacco Never   She has been exposed to passive smoke. The patient's alcohol use is:  Social History   Substance and Sexual Activity  Alcohol Use Not Currently   Comment: socially   Additional information: Last pap 08/25/2020, next one scheduled for 08/25/2025 per health maintenance.    Review of Systems  Constitutional: Negative.   HENT: Negative.    Eyes: Negative.  Respiratory: Negative.    Cardiovascular: Negative.   Gastrointestinal: Negative.   Endocrine: Negative.   Genitourinary: Negative.   Musculoskeletal: Negative.   Skin: Negative.   Allergic/Immunologic: Negative.   Neurological: Negative.   Hematological: Negative.   Psychiatric/Behavioral: Negative.       Today's Vitals   04/04/23 1423  BP: 120/72  Pulse: (!) 102  Temp: 98.5 F (36.9 C)  TempSrc: Oral  Weight: 161  lb 12.8 oz (73.4 kg)  Height: 5' (1.524 m)  PainSc: 0-No pain   Body mass index is 31.6 kg/m.  Wt Readings from Last 3 Encounters:  04/04/23 161 lb 12.8 oz (73.4 kg)  01/02/23 158 lb 9.6 oz (71.9 kg)  06/17/22 148 lb (67.1 kg)     Objective:  Physical Exam Vitals reviewed.  Constitutional:      General: She is not in acute distress.    Appearance: Normal appearance. She is well-developed.  HENT:     Head: Normocephalic and atraumatic.     Right Ear: Hearing, tympanic membrane, ear canal and external ear normal. There is no impacted cerumen.     Left Ear: Hearing, tympanic membrane, ear canal and external ear normal. There is no impacted cerumen.     Nose: Nose normal.     Mouth/Throat:     Mouth: Mucous membranes are moist.  Eyes:     General: Lids are normal.     Extraocular Movements: Extraocular movements intact.     Conjunctiva/sclera: Conjunctivae normal.     Pupils: Pupils are equal, round, and reactive to light.     Funduscopic exam:    Right eye: No papilledema.        Left eye: No papilledema.  Neck:     Thyroid: No thyroid mass.     Vascular: No carotid bruit.  Cardiovascular:     Rate and Rhythm: Normal rate and regular rhythm.     Pulses: Normal pulses.     Heart sounds: Normal heart sounds. No murmur heard. Pulmonary:     Effort: Pulmonary effort is normal. No respiratory distress.     Breath sounds: Normal breath sounds. No wheezing.  Chest:     Chest wall: No mass.  Breasts:    Tanner Score is 5.     Right: No mass or tenderness.     Left: Normal. No mass or tenderness.  Abdominal:     General: Abdomen is flat. Bowel sounds are normal. There is no distension.     Palpations: Abdomen is soft.     Tenderness: There is no abdominal tenderness.  Musculoskeletal:        General: No swelling or tenderness. Normal range of motion.     Cervical back: Full passive range of motion without pain, normal range of motion and neck supple.     Right lower leg:  No edema.     Left lower leg: No edema.  Lymphadenopathy:     Upper Body:     Right upper body: No supraclavicular, axillary or pectoral adenopathy.     Left upper body: No supraclavicular, axillary or pectoral adenopathy.  Skin:    General: Skin is warm and dry.     Capillary Refill: Capillary refill takes less than 2 seconds.  Neurological:     General: No focal deficit present.     Mental Status: She is alert and oriented to person, place, and time.     Cranial Nerves: No cranial nerve deficit.     Sensory: No  sensory deficit.     Motor: No weakness.  Psychiatric:        Mood and Affect: Mood normal.        Behavior: Behavior normal.        Thought Content: Thought content normal.        Judgment: Judgment normal.         Assessment And Plan:     Encounter for annual health examination Assessment & Plan: Behavior modifications discussed and diet history reviewed.   Pt will continue to exercise regularly and modify diet with low GI, plant based foods and decrease intake of processed foods.  Recommend intake of daily multivitamin, Vitamin D, and calcium.  Recommend monthly self breast exams for preventive screenings, as well as recommend immunizations that include influenza, TDAP    Class 1 obesity due to excess calories with body mass index (BMI) of 31.0 to 31.9 in adult, unspecified whether serious comorbidity present Assessment & Plan: She is encouraged to strive for BMI less than 30 to decrease cardiac risk. Advised to aim for at least 150 minutes of exercise per week. Admits need to add more cardio    ADD (attention deficit disorder) without hyperactivity Assessment & Plan: Overall she is doing well with current doses of Adderall. Not due for refill  Orders: -     CMP14+EGFR  Need for pneumococcal vaccination Assessment & Plan: Prevnar 20 given in office  Orders: -     Pneumococcal conjugate vaccine 20-valent  COVID-19 vaccine administered Assessment &  Plan: Covid 19 vaccine given in office observed for 15 minutes without any adverse reaction   Orders: -     Pfizer Comirnaty Covid-19 Vaccine 31yrs & older  Vitamin D deficiency Assessment & Plan: Will check vitamin D level and supplement as needed.    Also encouraged to spend 15 minutes in the sun daily.     Fibromyalgia Assessment & Plan: Stable, no recent flares   Other long term (current) drug therapy -     CBC with Differential/Platelet     Return in about 3 months (around 07/03/2023) for ADD. Patient was given opportunity to ask questions. Patient verbalized understanding of the plan and was able to repeat key elements of the plan. All questions were answered to their satisfaction.   Arnette Felts, FNP  I, Arnette Felts, FNP, have reviewed all documentation for this visit. The documentation on 04/04/23 for the exam, diagnosis, procedures, and orders are all accurate and complete.

## 2023-04-04 NOTE — Patient Instructions (Signed)
Goal to exercise 150 minutes per week with at least 2 days of strength training You can incorporate jumping rope to get cardio Encouraged to park further when at the store, take stairs instead of elevators and to walk in place during commercials. Increase water intake to at least one gallon of water daily.

## 2023-04-05 LAB — CMP14+EGFR
ALT: 30 [IU]/L (ref 0–32)
AST: 34 [IU]/L (ref 0–40)
Albumin: 4.3 g/dL (ref 3.9–4.9)
Alkaline Phosphatase: 54 [IU]/L (ref 44–121)
BUN/Creatinine Ratio: 8 — ABNORMAL LOW (ref 9–23)
BUN: 7 mg/dL (ref 6–20)
Bilirubin Total: <0.2 mg/dL (ref 0.0–1.2)
CO2: 24 mmol/L (ref 20–29)
Calcium: 9.4 mg/dL (ref 8.7–10.2)
Chloride: 103 mmol/L (ref 96–106)
Creatinine, Ser: 0.91 mg/dL (ref 0.57–1.00)
Globulin, Total: 2.7 g/dL (ref 1.5–4.5)
Glucose: 107 mg/dL — ABNORMAL HIGH (ref 70–99)
Potassium: 4.7 mmol/L (ref 3.5–5.2)
Sodium: 140 mmol/L (ref 134–144)
Total Protein: 7 g/dL (ref 6.0–8.5)
eGFR: 85 mL/min/{1.73_m2} (ref >59)

## 2023-04-05 LAB — CBC WITH DIFFERENTIAL/PLATELET
Basophils Absolute: 0.1 10*3/uL (ref 0.0–0.2)
Basos: 1 %
EOS (ABSOLUTE): 0.3 10*3/uL (ref 0.0–0.4)
Eos: 3 %
Hematocrit: 45.4 % (ref 34.0–46.6)
Hemoglobin: 14.7 g/dL (ref 11.1–15.9)
Immature Grans (Abs): 0 10*3/uL (ref 0.0–0.1)
Immature Granulocytes: 0 %
Lymphocytes Absolute: 2.4 10*3/uL (ref 0.7–3.1)
Lymphs: 28 %
MCH: 29.9 pg (ref 26.6–33.0)
MCHC: 32.4 g/dL (ref 31.5–35.7)
MCV: 93 fL (ref 79–97)
Monocytes Absolute: 0.4 10*3/uL (ref 0.1–0.9)
Monocytes: 5 %
Neutrophils Absolute: 5.4 10*3/uL (ref 1.4–7.0)
Neutrophils: 63 %
Platelets: 287 10*3/uL (ref 150–450)
RBC: 4.91 x10E6/uL (ref 3.77–5.28)
RDW: 12.8 % (ref 11.7–15.4)
WBC: 8.5 10*3/uL (ref 3.4–10.8)

## 2023-04-12 ENCOUNTER — Encounter: Payer: Self-pay | Admitting: Nurse Practitioner

## 2023-04-12 DIAGNOSIS — E66811 Obesity, class 1: Secondary | ICD-10-CM | POA: Insufficient documentation

## 2023-04-12 DIAGNOSIS — E559 Vitamin D deficiency, unspecified: Secondary | ICD-10-CM | POA: Insufficient documentation

## 2023-04-12 DIAGNOSIS — Z23 Encounter for immunization: Secondary | ICD-10-CM | POA: Insufficient documentation

## 2023-04-12 DIAGNOSIS — Z0000 Encounter for general adult medical examination without abnormal findings: Secondary | ICD-10-CM | POA: Insufficient documentation

## 2023-04-12 NOTE — Assessment & Plan Note (Addendum)
Overall she is doing well with current doses of Adderall. Not due for refill

## 2023-04-12 NOTE — Assessment & Plan Note (Signed)
Behavior modifications discussed and diet history reviewed.   Pt will continue to exercise regularly and modify diet with low GI, plant based foods and decrease intake of processed foods.  Recommend intake of daily multivitamin, Vitamin D, and calcium.  Recommend monthly self breast exams for preventive screenings, as well as recommend immunizations that include influenza, TDAP

## 2023-04-12 NOTE — Assessment & Plan Note (Signed)
Stable , no recent flares

## 2023-04-12 NOTE — Assessment & Plan Note (Signed)
Will check vitamin D level and supplement as needed.    Also encouraged to spend 15 minutes in the sun daily.

## 2023-04-12 NOTE — Assessment & Plan Note (Signed)
She is encouraged to strive for BMI less than 30 to decrease cardiac risk. Advised to aim for at least 150 minutes of exercise per week. Admits need to add more cardio

## 2023-04-12 NOTE — Assessment & Plan Note (Signed)
Prevnar 20 given in office

## 2023-04-12 NOTE — Assessment & Plan Note (Signed)
Covid 19 vaccine given in office observed for 15 minutes without any adverse reaction

## 2023-04-17 ENCOUNTER — Other Ambulatory Visit: Payer: Self-pay | Admitting: Nurse Practitioner

## 2023-04-17 DIAGNOSIS — F988 Other specified behavioral and emotional disorders with onset usually occurring in childhood and adolescence: Secondary | ICD-10-CM

## 2023-04-17 MED ORDER — AMPHETAMINE-DEXTROAMPHETAMINE 15 MG PO TABS: 15.0000 mg | ORAL_TABLET | Freq: Every morning | ORAL | 0 refills | Status: DC

## 2023-04-17 MED ORDER — AMPHETAMINE-DEXTROAMPHETAMINE 10 MG PO TABS: 10.0000 mg | ORAL_TABLET | Freq: Every evening | ORAL | 0 refills | Status: DC

## 2023-05-31 IMAGING — CT CT ABD-PELV W/ CM
2 of 4 series · 16 of 46 positions shown, 18 images · IV contrast (APPLIED)
Comparison: None.

CLINICAL DATA: Right lower quadrant abdominal pain x2 days,
nausea/vomiting

EXAM:
CT ABDOMEN AND PELVIS WITH CONTRAST
TECHNIQUE: Multidetector CT imaging of the abdomen and pelvis was performed
using the standard protocol following bolus administration of
intravenous contrast.
CONTRAST:  100mL OMNIPAQUE IOHEXOL 300 MG/ML  SOLN

[Series 3: abd/ pelvis 5.0 i30f 2 · axial · 0.82mm/px · z∈[+868,+1228]mm · 13 of 80 slices shown, 15 images]
[im 4/80  soft-tissue]
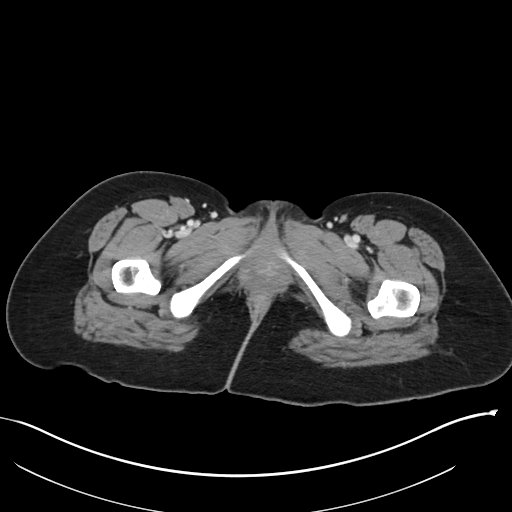
[im 4/80  bone]
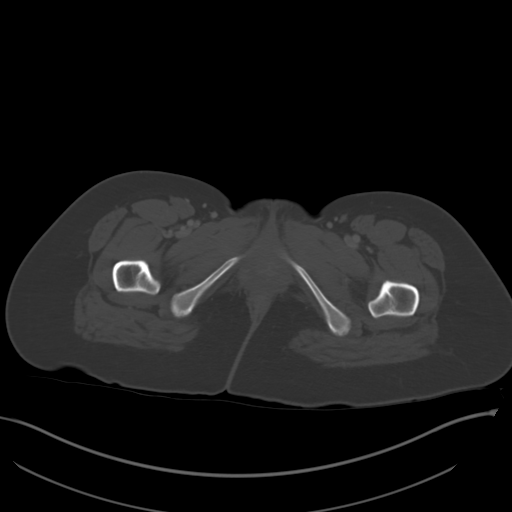
[im 10/80  soft-tissue]
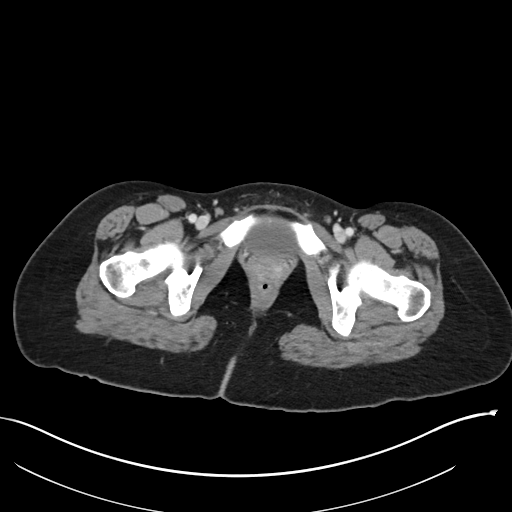
[im 16/80  soft-tissue]
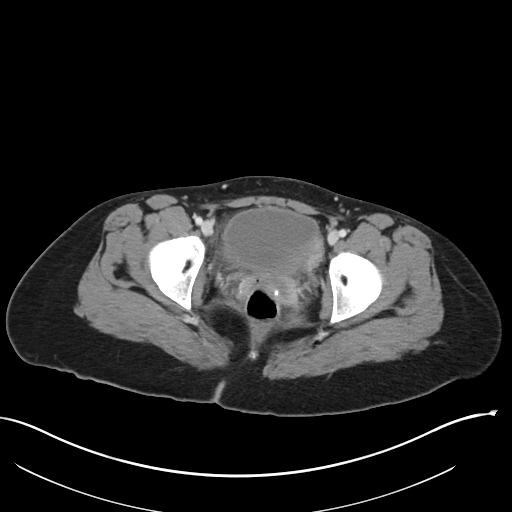
[im 23/80  soft-tissue]
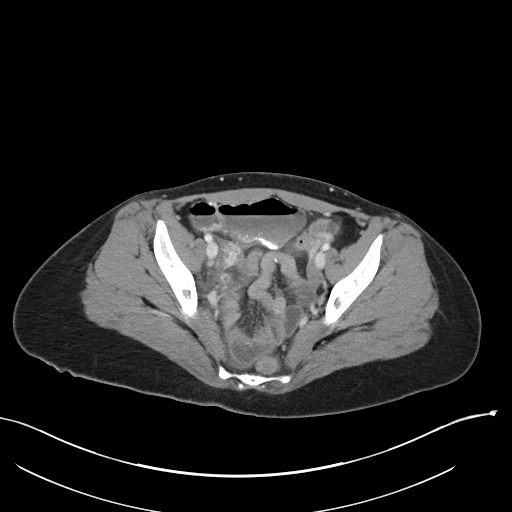
[im 29/80  soft-tissue]
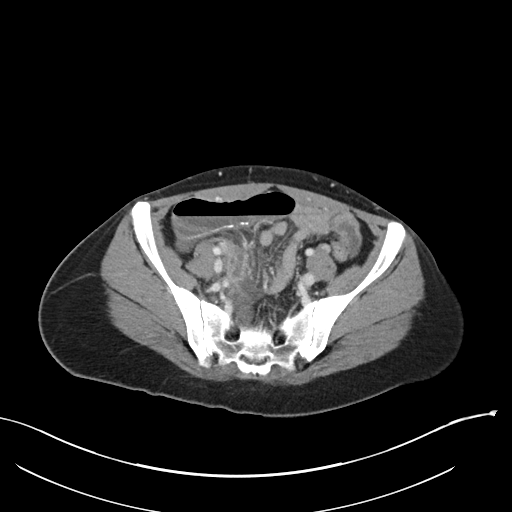
[im 35/80  soft-tissue]
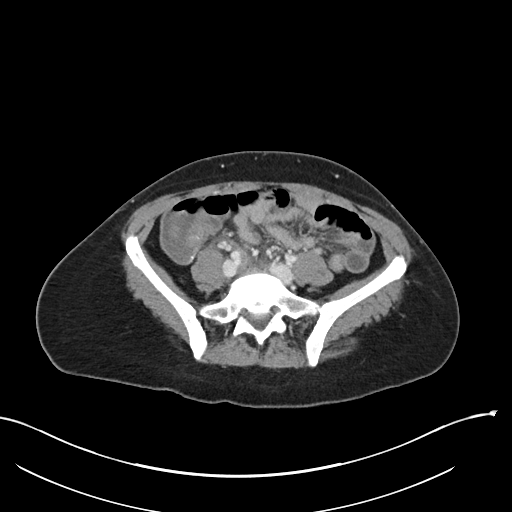
[im 42/80  soft-tissue]
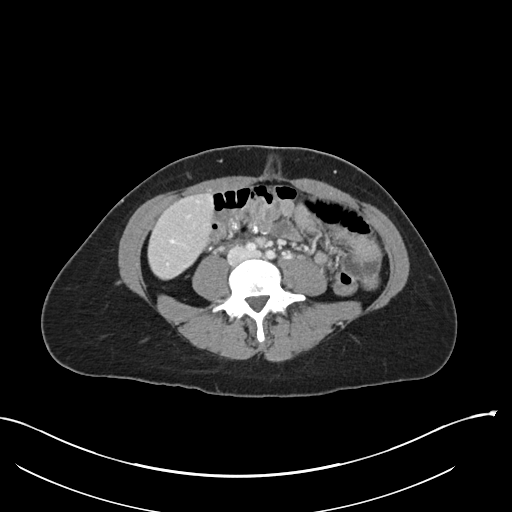
[im 45/80  soft-tissue]
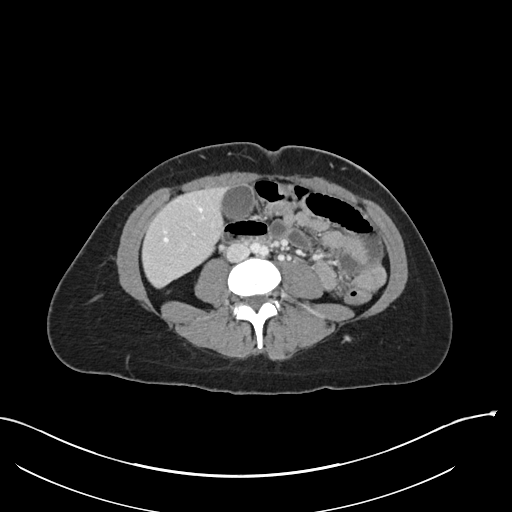
[im 51/80  soft-tissue]
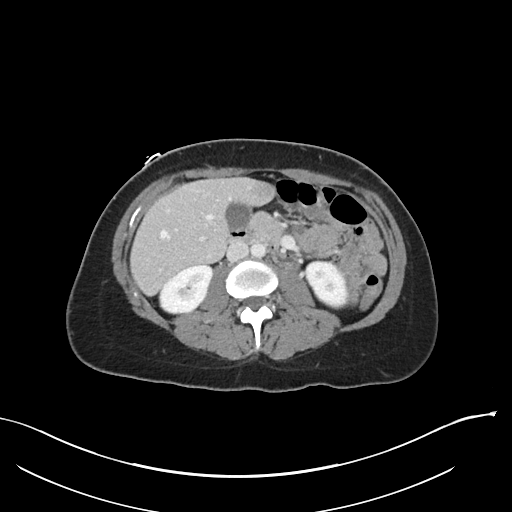
[im 51/80  bone]
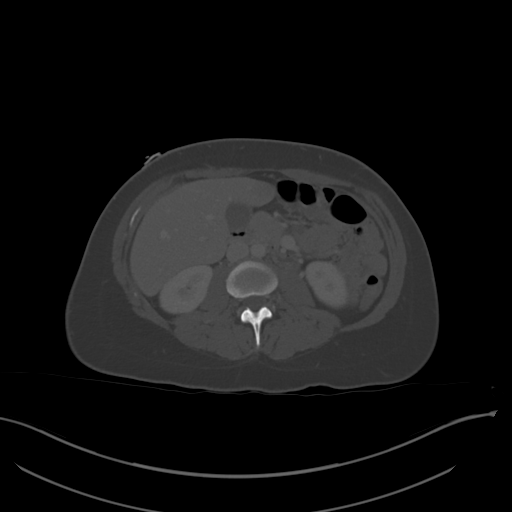
[im 57/80  soft-tissue]
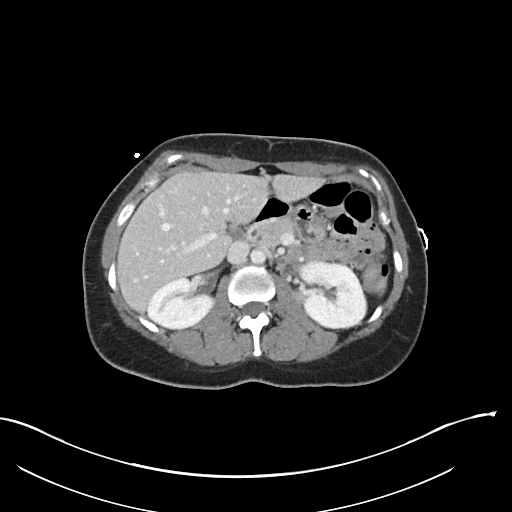
[im 64/80  soft-tissue]
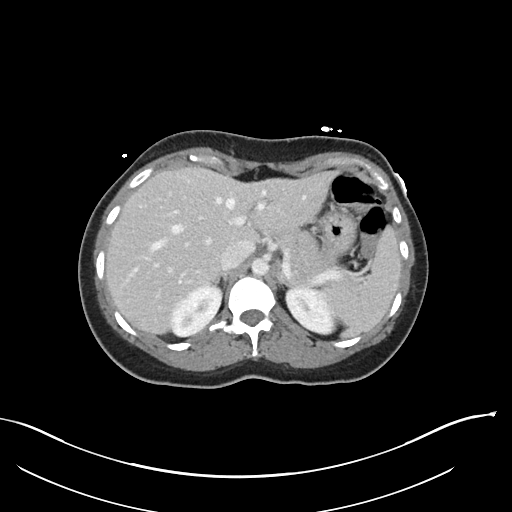
[im 70/80  soft-tissue]
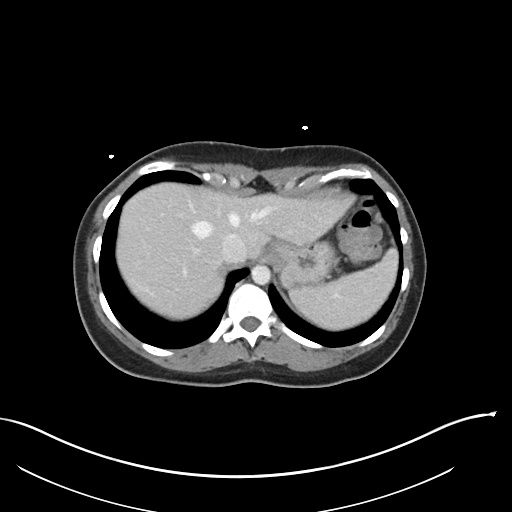
[im 76/80  soft-tissue]
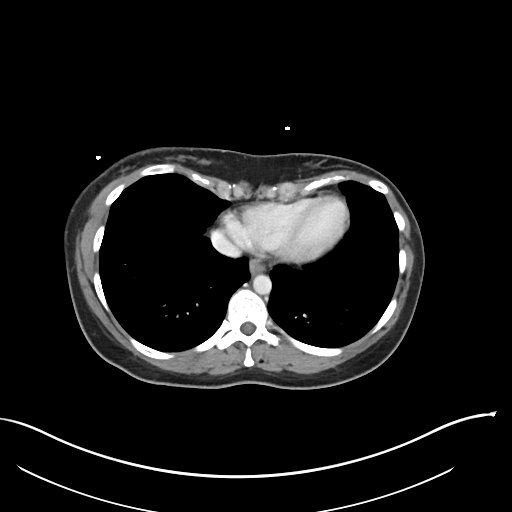

[Series 6: coronal soft tissue · coronal · 0.74mm/px · 3 of 75 slices shown]
[im 25/75  soft-tissue]
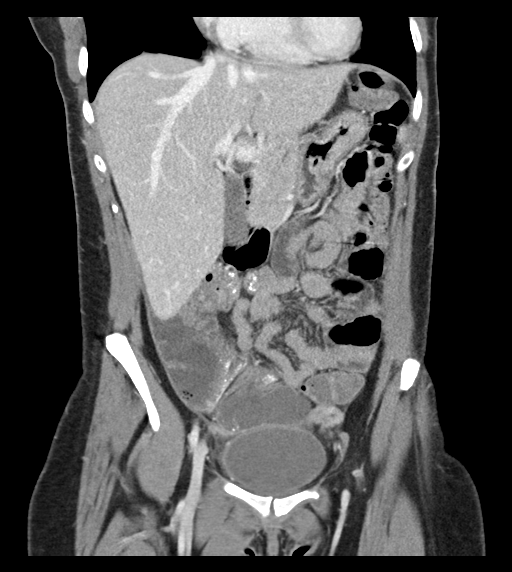
[im 33/75  soft-tissue]
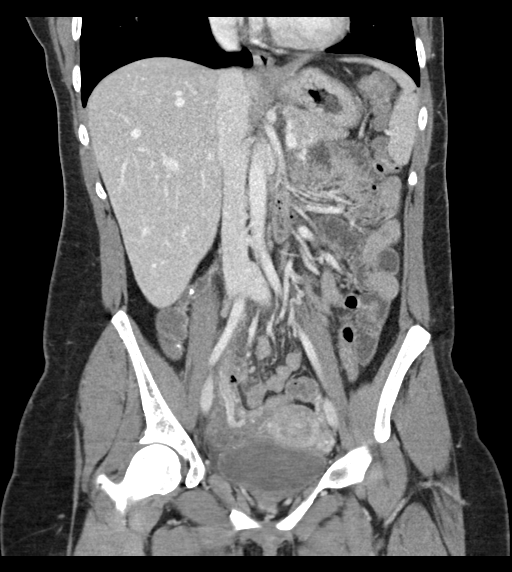
[im 42/75  soft-tissue]
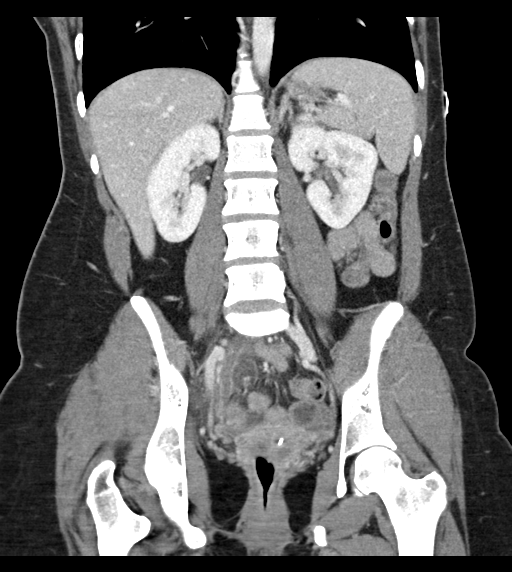

[16 of 46 positions shown; findings below may reference images not displayed]

FINDINGS: Lower chest: Lung bases are clear.

Hepatobiliary: Liver is within normal limits, noting focal
fat/altered perfusion along the falciform ligament.

Gallbladder is unremarkable. No intrahepatic or extrahepatic ductal
dilatation.

Pancreas: Within normal limits.

Spleen: Within normal limits.

Adrenals/Urinary Tract: Adrenal glands are within normal limits.

Kidneys are within normal limits.  No hydronephrosis.

Bladder is within normal limits.

Stomach/Bowel: Stomach is within normal limits.

No evidence of bowel obstruction.

Abnormal appendix, with focal dilatation measuring up to 12 mm
distally (coronal image 40), and a suspected proximal appendicolith
(coronal image 32), suspicious for acute appendicitis. Adjacent
localized, irregular extraluminal gas (coronal image 35) raises
concern for focal perforation. No drainable fluid
collection/abscess.

Vascular/Lymphatic: No evidence of abdominal aortic aneurysm.

No suspicious abdominopelvic lymphadenopathy.

Reproductive: Uterus is notable for an IUD in satisfactory position.

Bilateral ovaries are within normal limits.

Other: No abdominopelvic ascites.

No free air.

Musculoskeletal: Visualized osseous structures are within normal
limits.
IMPRESSION: Suspected acute appendicitis with adjacent extraluminal gas, raising
concern for focal perforation. No drainable fluid
collection/abscess. No free air.

## 2023-06-14 ENCOUNTER — Telehealth: Payer: Self-pay | Admitting: Family Medicine

## 2023-06-14 ENCOUNTER — Other Ambulatory Visit: Payer: Self-pay

## 2023-06-14 DIAGNOSIS — Z3009 Encounter for other general counseling and advice on contraception: Secondary | ICD-10-CM

## 2023-06-14 MED ORDER — NORETHIN ACE-ETH ESTRAD-FE 1-20 MG-MCG(24) PO TABS: 1.0000 | ORAL_TABLET | Freq: Every day | ORAL | 3 refills | Status: DC

## 2023-06-14 NOTE — Addendum Note (Signed)
 Addended byVidal Schwalbe on: 06/14/2023 09:22 AM   Modules accepted: Orders

## 2023-06-14 NOTE — Telephone Encounter (Signed)
 Verified name and DOB. Informed patient she is due for her gynecology annual visit and we will refill her birth control for 3 months. Patient understood.  Will route message to admin pool to schedule patient annual visit appointment.  Felecia Shelling CMA

## 2023-06-14 NOTE — Telephone Encounter (Signed)
 Patient is requesting that she has her birth control (Norethindrone Acetate-Ethinyl Estrad-FE) sent to her pharmacy she says she called and they have no refills for her.

## 2023-07-03 ENCOUNTER — Ambulatory Visit (INDEPENDENT_AMBULATORY_CARE_PROVIDER_SITE_OTHER): Payer: BC Managed Care – PPO | Admitting: Nurse Practitioner

## 2023-07-03 ENCOUNTER — Encounter: Payer: Self-pay | Admitting: Nurse Practitioner

## 2023-07-03 VITALS — BP 120/84 | HR 104 | Temp 99.2°F | Ht 60.0 in | Wt 164.0 lb

## 2023-07-03 DIAGNOSIS — F988 Other specified behavioral and emotional disorders with onset usually occurring in childhood and adolescence: Secondary | ICD-10-CM | POA: Diagnosis not present

## 2023-07-03 MED ORDER — AMPHETAMINE-DEXTROAMPHETAMINE 10 MG PO TABS: 10.0000 mg | ORAL_TABLET | Freq: Every evening | ORAL | 0 refills | Status: DC

## 2023-07-03 MED ORDER — AMPHETAMINE-DEXTROAMPHETAMINE 15 MG PO TABS: 15.0000 mg | ORAL_TABLET | Freq: Every morning | ORAL | 0 refills | Status: DC

## 2023-07-03 NOTE — Assessment & Plan Note (Signed)
 She is doing well with her medications. Will send 3 month refill with "do not refill dates".

## 2023-07-03 NOTE — Progress Notes (Signed)
 Del Favia, CMA,acting as a Neurosurgeon for Eileen Epley, FNP.,have documented all relevant documentation on the behalf of Eileen Epley, FNP,as directed by  Eileen Epley, FNP while in the presence of Eileen Epley, FNP.  Subjective:  Patient ID: Eileen Abbott , female    DOB: [birth year] , 33 y.o.   MRN: [id number]  No chief complaint on file.   HPI  Patient presents today for a ADD follow up, Patient reports compliance with medication. Patient denies any chest pain, SOB, or headaches. Patient has no concerns today.      Past Medical History:  Diagnosis Date   Asthma    Attention deficit disorder    Fibromyalgia      Family History  Problem Relation Age of Onset   Diabetes Mother    Arthritis Mother    Fibromyalgia Mother    Irritable bowel syndrome Father    Vitiligo Father    Prostate cancer Father    Asthma Sister    Hypertension Maternal Grandmother    Arthritis Maternal Grandmother    Fibromyalgia Maternal Grandmother    Irritable bowel syndrome Maternal Grandfather    Cancer Paternal Grandmother    Cancer Paternal Grandfather      Current Outpatient Medications:    albuterol  (VENTOLIN  HFA) 108 (90 Base) MCG/ACT inhaler, Inhale 2 puffs into the lungs every 6 (six) hours as needed for wheezing or shortness of breath., Disp: 18 each, Rfl: 2   DULoxetine  (CYMBALTA ) 30 MG capsule, TAKE 1 CAPSULE BY MOUTH EVERY DAY, Disp: 90 capsule, Rfl: 1   hydrocortisone  cream 0.5 %, Apply to area twice a day as needed, Disp: 30 g, Rfl: 0   Norethindrone Acetate-Ethinyl Estrad-FE (LOESTRIN 24 FE) 1-20 MG-MCG(24) tablet, Take 1 tablet by mouth daily., Disp: 28 tablet, Rfl: 3   [START ON 08/02/2023] amphetamine -dextroamphetamine  (ADDERALL) 10 MG tablet, Take 1 tablet (10 mg total) by mouth every evening., Disp: 30 tablet, Rfl: 0   amphetamine -dextroamphetamine  (ADDERALL) 10 MG tablet, Take 1 tablet (10 mg total) by mouth every evening., Disp: 30 tablet, Rfl: 0   [START ON 09/01/2023]  amphetamine -dextroamphetamine  (ADDERALL) 10 MG tablet, Take 1 tablet (10 mg total) by mouth every evening., Disp: 30 tablet, Rfl: 0   amphetamine -dextroamphetamine  (ADDERALL) 15 MG tablet, Take 1 tablet by mouth every morning, Disp: 30 tablet, Rfl: 0   [START ON 09/01/2023] amphetamine -dextroamphetamine  (ADDERALL) 15 MG tablet, Take 1 tablet by mouth every morning, Disp: 30 tablet, Rfl: 0   [START ON 08/02/2023] amphetamine -dextroamphetamine  (ADDERALL) 15 MG tablet, Take 1 tablet by mouth every morning, Disp: 30 tablet, Rfl: 0   Allergies  Allergen Reactions   Zofran  [Ondansetron ] Shortness Of Breath   Banana Nausea And Vomiting   Egg-Derived Products Nausea And Vomiting     Review of Systems  Constitutional: Negative.   HENT: Negative.    Eyes: Negative.   Respiratory: Negative.    Cardiovascular: Negative.   Gastrointestinal: Negative.   Neurological: Negative.   Psychiatric/Behavioral: Negative.       Today's Vitals   07/03/23 1148  BP: 120/84  Pulse: (!) 104  Temp: 99.2 F (37.3 C)  TempSrc: Oral  Weight: 164 lb (74.4 kg)  Height: 5' (1.524 m)  PainSc: 0-No pain   Body mass index is 32.03 kg/m.  Wt Readings from Last 3 Encounters:  07/03/23 164 lb (74.4 kg)  04/04/23 161 lb 12.8 oz (73.4 kg)  01/02/23 158 lb 9.6 oz (71.9 kg)     Objective:  Physical Exam Vitals and  nursing note reviewed.  Constitutional:      General: She is not in acute distress.    Appearance: Normal appearance.  Cardiovascular:     Rate and Rhythm: Normal rate and regular rhythm.     Pulses: Normal pulses.     Heart sounds: Normal heart sounds. No murmur heard. Pulmonary:     Effort: Pulmonary effort is normal. No respiratory distress.     Breath sounds: Normal breath sounds. No wheezing.  Skin:    General: Skin is warm and dry.     Capillary Refill: Capillary refill takes less than 2 seconds.  Neurological:     General: No focal deficit present.     Mental Status: She is alert and  oriented to person, place, and time.     Cranial Nerves: No cranial nerve deficit.     Motor: No weakness.         Assessment And Plan:  ADD (attention deficit disorder) without hyperactivity Assessment & Plan: She is doing well with her medications. Will send 3 month refill with "do not refill dates".   Orders: -     Amphetamine -Dextroamphetamine ; Take 1 tablet by mouth every morning  Dispense: 30 tablet; Refill: 0 -     Amphetamine -Dextroamphetamine ; Take 1 tablet (10 mg total) by mouth every evening.  Dispense: 30 tablet; Refill: 0 -     Amphetamine -Dextroamphetamine ; Take 1 tablet by mouth every morning  Dispense: 30 tablet; Refill: 0 -     Amphetamine -Dextroamphetamine ; Take 1 tablet (10 mg total) by mouth every evening.  Dispense: 30 tablet; Refill: 0 -     Amphetamine -Dextroamphetamine ; Take 1 tablet by mouth every morning  Dispense: 30 tablet; Refill: 0 -     Amphetamine -Dextroamphetamine ; Take 1 tablet (10 mg total) by mouth every evening.  Dispense: 30 tablet; Refill: 0    Return in about 3 months (around 10/02/2023) for ADD.  Patient was given opportunity to ask questions. Patient verbalized understanding of the plan and was able to repeat key elements of the plan. All questions were answered to their satisfaction.    Inge Mangle, FNP, have reviewed all documentation for this visit. The documentation on 07/03/23 for the exam, diagnosis, procedures, and orders are all accurate and complete.   IF YOU HAVE BEEN REFERRED TO A SPECIALIST, IT MAY TAKE 1-2 WEEKS TO SCHEDULE/PROCESS THE REFERRAL. IF YOU HAVE NOT HEARD FROM US /SPECIALIST IN TWO WEEKS, PLEASE GIVE US  A CALL AT [phone].

## 2023-07-24 ENCOUNTER — Ambulatory Visit: Admitting: Obstetrics and Gynecology

## 2023-07-24 ENCOUNTER — Other Ambulatory Visit (HOSPITAL_COMMUNITY)
Admission: RE | Admit: 2023-07-24 | Discharge: 2023-07-24 | Disposition: A | Discharge status: Home / Self Care | Source: Ambulatory Visit | Attending: Obstetrics and Gynecology | Admitting: Obstetrics and Gynecology

## 2023-07-24 ENCOUNTER — Encounter: Payer: Self-pay | Admitting: Obstetrics and Gynecology

## 2023-07-24 ENCOUNTER — Other Ambulatory Visit: Payer: Self-pay

## 2023-07-24 VITALS — BP 132/90 | HR 91 | Wt 165.0 lb

## 2023-07-24 DIAGNOSIS — R87610 Atypical squamous cells of undetermined significance on cytologic smear of cervix (ASC-US): Secondary | ICD-10-CM

## 2023-07-24 DIAGNOSIS — Z01419 Encounter for gynecological examination (general) (routine) without abnormal findings: Secondary | ICD-10-CM | POA: Diagnosis not present

## 2023-07-24 DIAGNOSIS — Z124 Encounter for screening for malignant neoplasm of cervix: Secondary | ICD-10-CM | POA: Diagnosis not present

## 2023-07-24 DIAGNOSIS — Z1331 Encounter for screening for depression: Secondary | ICD-10-CM | POA: Diagnosis not present

## 2023-07-24 NOTE — Progress Notes (Signed)
 ANNUAL EXAM Patient name: Eileen Abbott MRN [id number]  Date of birth: 04/02/1990 Chief Complaint:   Gynecologic Exam  History of Present Illness:   Eileen Abbott is a 33 y.o. G1P1001 being seen today for a routine annual exam.  Current complaints: none  Menstrual concerns? No  monthly cycles Breast or nipple changes? No  Contraception use? Yes OCPs Sexually active? No   Patient's last menstrual period was 06/28/2023.   The pregnancy intention screening data noted above was reviewed. Potential methods of contraception were discussed. The patient elected to proceed with No data recorded.   Last pap     Component Value Date/Time   DIAGPAP (A) 08/25/2020 0917    - Atypical squamous cells of undetermined significance (ASC-US )   HPVHIGH Negative 08/25/2020 0917   ADEQPAP  08/25/2020 0917    Satisfactory for evaluation; transformation zone component PRESENT.    Last mammogram: n/a.  Last colonoscopy: n/a.      07/03/2023   11:51 AM 06/17/2022    9:43 AM 06/14/2022    4:36 PM 03/15/2022    3:56 PM 04/02/2021   11:30 AM  Depression screen PHQ 2/9  Decreased Interest 0 0 0 0 0  Down, Depressed, Hopeless 0 0 0 0 0  PHQ - 2 Score 0 0 0 0 0  Altered sleeping 0 0   0  Tired, decreased energy 0 0   0  Change in appetite 0 0   0  Feeling bad or failure about yourself  0 0   0  Trouble concentrating 0 0   0  Moving slowly or fidgety/restless 0 0   0  Suicidal thoughts 0 0   0  PHQ-9 Score 0 0   0  Difficult doing work/chores Not difficult at all            07/03/2023   11:52 AM 06/17/2022    9:43 AM 04/02/2021   11:30 AM 10/08/2020    8:29 AM  GAD 7 : Generalized Anxiety Score  Nervous, Anxious, on Edge 0 0 0 0  Control/stop worrying 0 0 0 0  Worry too much - different things 0 0 0 0  Trouble relaxing 0 0 0 0  Restless 0 0 0 0  Easily annoyed or irritable 0 0 0 0  Afraid - awful might happen 0 0 0 0  Total GAD 7 Score 0 0 0 0  Anxiety Difficulty Not difficult at all         Review of Systems:   Pertinent items are noted in HPI Denies any headaches, blurred vision, fatigue, shortness of breath, chest pain, abdominal pain, abnormal vaginal discharge/itching/odor/irritation, problems with periods, bowel movements, urination, or intercourse unless otherwise stated above. Pertinent History Reviewed:  Reviewed past medical,surgical, social and family history.  Reviewed problem list, medications and allergies. Physical Assessment:  There were no vitals filed for this visit.There is no height or weight on file to calculate BMI.        Physical Examination:   General appearance - well appearing, and in no distress  Mental status - alert, oriented to person, place, and time  Psych:  She has a normal mood and affect  Skin - warm and dry, normal color, no suspicious lesions noted  Chest - effort normal, all lung fields clear to auscultation bilaterally  Heart - normal rate and regular rhythm  Abdomen - soft, nontender, nondistended, no masses or organomegaly  Pelvic -  VULVA: normal appearing vulva  with no masses, tenderness or lesions   VAGINA: normal appearing vagina with normal color and discharge, no lesions   CERVIX: normal appearing cervix without discharge or lesions, no CMT  Thin prep pap is done with HR HPV cotesting  UTERUS: uterus is felt to be normal size, shape, consistency and nontender   ADNEXA: No adnexal masses or tenderness noted.  Extremities:  No swelling or varicosities noted  Chaperone present for exam  No results found for this or any previous visit (from the past 24 hours).    Assessment & Plan:  1. Well woman exam with routine gynecological exam (Primary) - Cervical cancer screening: Discussed guidelines. Pap with HPV collected - GC/CT: not indicated - Birth Control: OCPs - Breast Health: Encouraged self breast awareness/SBE. Teaching provided.  - F/U 12 months and prn  2. Screening for cervical cancer 3. ASCUS of cervix  with negative high risk HPV Pap collected today  - Cytology - PAP   Follow-up: Return if symptoms worsen or fail to improve, for Annual GYN.  Kiki Pelton, MD 07/24/2023 1:50 PM

## 2023-07-27 LAB — CYTOLOGY - PAP
Comment: NEGATIVE
Diagnosis: NEGATIVE
Diagnosis: REACTIVE
High risk HPV: NEGATIVE

## 2023-07-28 ENCOUNTER — Ambulatory Visit: Payer: Self-pay | Admitting: Obstetrics and Gynecology

## 2023-08-15 ENCOUNTER — Other Ambulatory Visit: Payer: Self-pay | Admitting: Nurse Practitioner

## 2023-08-15 DIAGNOSIS — M797 Fibromyalgia: Secondary | ICD-10-CM

## 2023-09-12 ENCOUNTER — Other Ambulatory Visit: Payer: Self-pay | Admitting: Nurse Practitioner

## 2023-09-12 DIAGNOSIS — Z8709 Personal history of other diseases of the respiratory system: Secondary | ICD-10-CM

## 2023-09-13 ENCOUNTER — Other Ambulatory Visit: Payer: Self-pay | Admitting: Lactation Services

## 2023-09-13 DIAGNOSIS — Z3009 Encounter for other general counseling and advice on contraception: Secondary | ICD-10-CM

## 2023-09-13 MED ORDER — NORETHIN ACE-ETH ESTRAD-FE 1-20 MG-MCG(24) PO TABS: 1.0000 | ORAL_TABLET | Freq: Every day | ORAL | 12 refills | Status: AC

## 2023-09-13 NOTE — Progress Notes (Signed)
 OCP's reordered for 1 year. Last annual exam May 2025.

## 2023-10-02 ENCOUNTER — Encounter: Payer: Self-pay | Admitting: Nurse Practitioner

## 2023-10-02 ENCOUNTER — Ambulatory Visit (INDEPENDENT_AMBULATORY_CARE_PROVIDER_SITE_OTHER): Admitting: Nurse Practitioner

## 2023-10-02 VITALS — BP 100/60 | HR 103 | Temp 97.9°F | Ht 60.0 in | Wt 168.4 lb

## 2023-10-02 DIAGNOSIS — Z6832 Body mass index (BMI) 32.0-32.9, adult: Secondary | ICD-10-CM

## 2023-10-02 DIAGNOSIS — E66811 Obesity, class 1: Secondary | ICD-10-CM

## 2023-10-02 DIAGNOSIS — F988 Other specified behavioral and emotional disorders with onset usually occurring in childhood and adolescence: Secondary | ICD-10-CM | POA: Diagnosis not present

## 2023-10-02 DIAGNOSIS — E6609 Other obesity due to excess calories: Secondary | ICD-10-CM | POA: Diagnosis not present

## 2023-10-02 MED ORDER — AMPHETAMINE-DEXTROAMPHETAMINE 15 MG PO TABS: 15.0000 mg | ORAL_TABLET | Freq: Every morning | ORAL | 0 refills | Status: DC

## 2023-10-02 MED ORDER — AMPHETAMINE-DEXTROAMPHETAMINE 15 MG PO TABS
15.0000 mg | ORAL_TABLET | Freq: Every morning | ORAL | 0 refills | Status: DC
Start: 2023-10-02 — End: 2023-10-02

## 2023-10-02 MED ORDER — AMPHETAMINE-DEXTROAMPHETAMINE 10 MG PO TABS: 10.0000 mg | ORAL_TABLET | Freq: Every evening | ORAL | 0 refills | Status: DC

## 2023-10-02 NOTE — Progress Notes (Signed)
 [name], CMA,acting as a Neurosurgeon for Gaines Ada, FNP.,have documented all relevant documentation on the behalf of Gaines Ada, FNP,as directed by  Gaines Ada, FNP while in the presence of Gaines Ada, FNP.  Subjective:  Patient ID: Eileen Abbott , female    DOB: 1990/06/21 , 33 y.o.   MRN: [id number]  Chief Complaint  Patient presents with   ADD    Patient presents today for a ADD follow up, Patient reports compliance with medication. Patient denies any chest pain, SOB, or headaches. Patient has no concerns today.     HPI  Here for f/u with ADD medications.      Past Medical History:  Diagnosis Date   Acute perforated appendicitis 02/20/2021   Asthma    Attention deficit disorder    Fibromyalgia    Gestational hypertension 07/15/2020   Vaginal delivery 07/15/2020     Family History  Problem Relation Age of Onset   Diabetes Mother    Arthritis Mother    Fibromyalgia Mother    Irritable bowel syndrome Father    Vitiligo Father    Prostate cancer Father    Asthma Sister    Hypertension Maternal Grandmother    Arthritis Maternal Grandmother    Fibromyalgia Maternal Grandmother    Irritable bowel syndrome Maternal Grandfather    Cancer Paternal Grandmother    Cancer Paternal Grandfather      Current Outpatient Medications:    albuterol  (VENTOLIN  HFA) 108 (90 Base) MCG/ACT inhaler, TAKE 2 PUFFS BY MOUTH EVERY 6 HOURS AS NEEDED FOR WHEEZE OR SHORTNESS OF BREATH, Disp: 8.5 each, Rfl: 2   DULoxetine  (CYMBALTA ) 30 MG capsule, TAKE 1 CAPSULE BY MOUTH EVERY DAY, Disp: 90 capsule, Rfl: 1   hydrocortisone  cream 0.5 %, Apply to area twice a day as needed, Disp: 30 g, Rfl: 0   Norethindrone Acetate-Ethinyl Estrad-FE (LOESTRIN 24 FE) 1-20 MG-MCG(24) tablet, Take 1 tablet by mouth daily., Disp: 28 tablet, Rfl: 12   amphetamine -dextroamphetamine  (ADDERALL) 10 MG tablet, Take 1 tablet (10 mg total) by mouth every evening., Disp: 30 tablet, Rfl: 0   [START ON 11/01/2023]  amphetamine -dextroamphetamine  (ADDERALL) 10 MG tablet, Take 1 tablet (10 mg total) by mouth every evening., Disp: 30 tablet, Rfl: 0   [START ON 12/01/2023] amphetamine -dextroamphetamine  (ADDERALL) 10 MG tablet, Take 1 tablet (10 mg total) by mouth every evening., Disp: 30 tablet, Rfl: 0   [START ON 11/01/2023] amphetamine -dextroamphetamine  (ADDERALL) 15 MG tablet, Take 1 tablet by mouth every morning, Disp: 30 tablet, Rfl: 0   [START ON 12/01/2023] amphetamine -dextroamphetamine  (ADDERALL) 15 MG tablet, Take 1 tablet by mouth every morning, Disp: 30 tablet, Rfl: 0   amphetamine -dextroamphetamine  (ADDERALL) 15 MG tablet, Take 1 tablet by mouth every morning, Disp: 30 tablet, Rfl: 0   Allergies  Allergen Reactions   Zofran  [Ondansetron ] Shortness Of Breath   Banana Nausea And Vomiting   Egg-Derived Products Nausea And Vomiting     Review of Systems  Constitutional: Negative.   HENT: Negative.    Eyes: Negative.   Respiratory: Negative.    Cardiovascular: Negative.   Gastrointestinal: Negative.   Neurological: Negative.   Psychiatric/Behavioral: Negative.       Today's Vitals   10/02/23 1147  BP: 100/60  Pulse: (!) 103  Temp: 97.9 F (36.6 C)  TempSrc: Oral  Weight: 168 lb 6.4 oz (76.4 kg)  Height: 5' (1.524 m)  PainSc: 0-No pain   Body mass index is 32.89 kg/m.  Wt Readings from Last 3 Encounters:  10/02/23 168 lb 6.4 oz (76.4 kg)  07/24/23 165 lb (74.8 kg)  07/03/23 164 lb (74.4 kg)      Objective:  Physical Exam Vitals and nursing note reviewed.  Constitutional:      General: She is not in acute distress.    Appearance: Normal appearance.  Cardiovascular:     Rate and Rhythm: Normal rate and regular rhythm.     Pulses: Normal pulses.     Heart sounds: Normal heart sounds. No murmur heard. Pulmonary:     Effort: Pulmonary effort is normal. No respiratory distress.     Breath sounds: Normal breath sounds. No wheezing.  Skin:    General: Skin is warm and dry.      Capillary Refill: Capillary refill takes less than 2 seconds.  Neurological:     General: No focal deficit present.     Mental Status: She is alert and oriented to person, place, and time.     Cranial Nerves: No cranial nerve deficit.     Motor: No weakness.         Assessment And Plan:  Class 1 obesity due to excess calories with body mass index (BMI) of 32.0 to 32.9 in adult, unspecified whether serious comorbidity present Assessment & Plan: She is encouraged to strive for BMI less than 30 to decrease cardiac risk. Advised to aim for at least 150 minutes of exercise per week.    ADD (attention deficit disorder) without hyperactivity Assessment & Plan: She is doing well with her medications. Will send 3 month refill with do not refill dates.   Orders: -     Amphetamine -Dextroamphetamine ; Take 1 tablet (10 mg total) by mouth every evening.  Dispense: 30 tablet; Refill: 0 -     Amphetamine -Dextroamphetamine ; Take 1 tablet (10 mg total) by mouth every evening.  Dispense: 30 tablet; Refill: 0 -     Amphetamine -Dextroamphetamine ; Take 1 tablet (10 mg total) by mouth every evening.  Dispense: 30 tablet; Refill: 0 -     Amphetamine -Dextroamphetamine ; Take 1 tablet by mouth every morning  Dispense: 30 tablet; Refill: 0 -     Amphetamine -Dextroamphetamine ; Take 1 tablet by mouth every morning  Dispense: 30 tablet; Refill: 0 -     Amphetamine -Dextroamphetamine ; Take 1 tablet by mouth every morning  Dispense: 30 tablet; Refill: 0    Return in about 3 months (around 01/02/2024).  Patient was given opportunity to ask questions. Patient verbalized understanding of the plan and was able to repeat key elements of the plan. All questions were answered to their satisfaction.    LILLETTE Gaines Ada, FNP, have reviewed all documentation for this visit. The documentation on 10/02/23 for the exam, diagnosis, procedures, and orders are all accurate and complete.   IF YOU HAVE BEEN REFERRED TO A  SPECIALIST, IT MAY TAKE 1-2 WEEKS TO SCHEDULE/PROCESS THE REFERRAL. IF YOU HAVE NOT HEARD FROM US /SPECIALIST IN TWO WEEKS, PLEASE GIVE US  A CALL AT [phone].

## 2023-10-09 ENCOUNTER — Encounter: Payer: Self-pay | Admitting: Nurse Practitioner

## 2023-10-09 DIAGNOSIS — E66811 Other obesity due to excess calories: Secondary | ICD-10-CM | POA: Insufficient documentation

## 2023-10-09 NOTE — Assessment & Plan Note (Signed)
 She is doing well with her medications. Will send 3 month refill with "do not refill dates".

## 2023-10-09 NOTE — Assessment & Plan Note (Signed)
 She is encouraged to strive for BMI less than 30 to decrease cardiac risk. Advised to aim for at least 150 minutes of exercise per week.

## 2024-01-06 ENCOUNTER — Encounter: Payer: Self-pay | Admitting: Nurse Practitioner

## 2024-01-08 ENCOUNTER — Telehealth: Admitting: Nurse Practitioner

## 2024-01-08 VITALS — Ht 60.0 in | Wt 169.0 lb

## 2024-01-08 DIAGNOSIS — Z6833 Body mass index (BMI) 33.0-33.9, adult: Secondary | ICD-10-CM | POA: Diagnosis not present

## 2024-01-08 DIAGNOSIS — E66811 Obesity, class 1: Secondary | ICD-10-CM | POA: Diagnosis not present

## 2024-01-08 DIAGNOSIS — F988 Other specified behavioral and emotional disorders with onset usually occurring in childhood and adolescence: Secondary | ICD-10-CM

## 2024-01-08 MED ORDER — AMPHETAMINE-DEXTROAMPHETAMINE 15 MG PO TABS: 15.0000 mg | ORAL_TABLET | Freq: Every morning | ORAL | 0 refills | Status: AC

## 2024-01-08 MED ORDER — AMPHETAMINE-DEXTROAMPHETAMINE 10 MG PO TABS: 10.0000 mg | ORAL_TABLET | Freq: Every evening | ORAL | 0 refills | Status: AC

## 2024-01-08 NOTE — Assessment & Plan Note (Signed)
 Continues to do well with current regimen. I have reviewed the PDMP during this encounter. 3 month supply sent to pharmacy with do not fill before instructions

## 2024-01-08 NOTE — Progress Notes (Deleted)
 [name], CMA,acting as a neurosurgeon for Gaines Ada, FNP.,have documented all relevant documentation on the behalf of Gaines Ada, FNP,as directed by  Gaines Ada, FNP while in the presence of Gaines Ada, FNP.  Subjective:  Patient ID: Eileen Abbott , female    DOB: [birth year] , 33 y.o.   MRN: [id number]  No chief complaint on file.   HPI  HPI   Past Medical History:  Diagnosis Date   Acute perforated appendicitis 02/20/2021   Asthma    Attention deficit disorder    Fibromyalgia    Gestational hypertension 07/15/2020   Vaginal delivery 07/15/2020     Family History  Problem Relation Age of Onset   Diabetes Mother    Arthritis Mother    Fibromyalgia Mother    Irritable bowel syndrome Father    Vitiligo Father    Prostate cancer Father    Asthma Sister    Hypertension Maternal Grandmother    Arthritis Maternal Grandmother    Fibromyalgia Maternal Grandmother    Irritable bowel syndrome Maternal Grandfather    Cancer Paternal Grandmother    Cancer Paternal Grandfather      Current Outpatient Medications:    albuterol  (VENTOLIN  HFA) 108 (90 Base) MCG/ACT inhaler, TAKE 2 PUFFS BY MOUTH EVERY 6 HOURS AS NEEDED FOR WHEEZE OR SHORTNESS OF BREATH, Disp: 8.5 each, Rfl: 2   amphetamine -dextroamphetamine  (ADDERALL) 10 MG tablet, Take 1 tablet (10 mg total) by mouth every evening., Disp: 30 tablet, Rfl: 0   amphetamine -dextroamphetamine  (ADDERALL) 10 MG tablet, Take 1 tablet (10 mg total) by mouth every evening., Disp: 30 tablet, Rfl: 0   amphetamine -dextroamphetamine  (ADDERALL) 10 MG tablet, Take 1 tablet (10 mg total) by mouth every evening., Disp: 30 tablet, Rfl: 0   amphetamine -dextroamphetamine  (ADDERALL) 15 MG tablet, Take 1 tablet by mouth every morning, Disp: 30 tablet, Rfl: 0   amphetamine -dextroamphetamine  (ADDERALL) 15 MG tablet, Take 1 tablet by mouth every morning, Disp: 30 tablet, Rfl: 0   amphetamine -dextroamphetamine  (ADDERALL) 15 MG tablet, Take 1 tablet  by mouth every morning, Disp: 30 tablet, Rfl: 0   DULoxetine  (CYMBALTA ) 30 MG capsule, TAKE 1 CAPSULE BY MOUTH EVERY DAY, Disp: 90 capsule, Rfl: 1   hydrocortisone  cream 0.5 %, Apply to area twice a day as needed, Disp: 30 g, Rfl: 0   Norethindrone Acetate-Ethinyl Estrad-FE (LOESTRIN 24 FE) 1-20 MG-MCG(24) tablet, Take 1 tablet by mouth daily., Disp: 28 tablet, Rfl: 12   Allergies  Allergen Reactions   Zofran  [Ondansetron ] Shortness Of Breath   Banana Nausea And Vomiting   Egg Protein-Containing Drug Products Nausea And Vomiting     Review of Systems   There were no vitals filed for this visit. There is no height or weight on file to calculate BMI.  Wt Readings from Last 3 Encounters:  10/02/23 168 lb 6.4 oz (76.4 kg)  07/24/23 165 lb (74.8 kg)  07/03/23 164 lb (74.4 kg)    The ASCVD Risk score (Arnett DK, et al., 2019) failed to calculate for the following reasons:   The 2019 ASCVD risk score is only valid for ages 62 to 2  Objective:  Physical Exam      Assessment And Plan:  ADD (attention deficit disorder) without hyperactivity    No follow-ups on file.  Patient was given opportunity to ask questions. Patient verbalized understanding of the plan and was able to repeat key elements of the plan. All questions were answered to their satisfaction.    I, Gaines Ada, FNP,  have reviewed all documentation for this visit. The documentation on 01/08/24 for the exam, diagnosis, procedures, and orders are all accurate and complete.   IF YOU HAVE BEEN REFERRED TO A SPECIALIST, IT MAY TAKE 1-2 WEEKS TO SCHEDULE/PROCESS THE REFERRAL. IF YOU HAVE NOT HEARD FROM US /SPECIALIST IN TWO WEEKS, PLEASE GIVE US  A CALL AT [phone].

## 2024-01-08 NOTE — Progress Notes (Signed)
 Virtual Visit via Video Note  Eileen Abbott, CMA,acting as a scribe for Gaines Ada, FNP.,have documented all relevant documentation on the behalf of Gaines Ada, FNP,as directed by  Gaines Ada, FNP while in the presence of Gaines Ada, FNP.  I connected with Eileen Abbott on 01/08/24 at 11:00 AM EDT by a video enabled telemedicine application and verified that I am speaking with the correct person using two identifiers.  Patient Location: Other:  work Dispensing Optician: Office/Clinic  I discussed the limitations, risks, security, and privacy concerns of performing an evaluation and management service by video and the availability of in person appointments. I also discussed with the patient that there may be a patient responsible charge related to this service. The patient expressed understanding and agreed to proceed.  Subjective: PCP: Ada Gaines, FNP  Chief Complaint  Patient presents with   ADHD    Patient presents today for a ADHD follow up, Patient reports compliance with medication. Patient denies any chest pain, SOB, or headaches. Patient has no concerns today.    She is doing well with her current dose. No concerns with any side effects. Denies palpitations or sleeping problems.     ROS: Per HPI  Current Outpatient Medications:    albuterol  (VENTOLIN  HFA) 108 (90 Base) MCG/ACT inhaler, TAKE 2 PUFFS BY MOUTH EVERY 6 HOURS AS NEEDED FOR WHEEZE OR SHORTNESS OF BREATH, Disp: 8.5 each, Rfl: 2   DULoxetine  (CYMBALTA ) 30 MG capsule, TAKE 1 CAPSULE BY MOUTH EVERY DAY, Disp: 90 capsule, Rfl: 1   hydrocortisone  cream 0.5 %, Apply to area twice a day as needed, Disp: 30 g, Rfl: 0   Norethindrone Acetate-Ethinyl Estrad-FE (LOESTRIN 24 FE) 1-20 MG-MCG(24) tablet, Take 1 tablet by mouth daily., Disp: 28 tablet, Rfl: 12   amphetamine -dextroamphetamine  (ADDERALL) 10 MG tablet, Take 1 tablet (10 mg total) by mouth every evening., Disp: 30 tablet, Rfl: 0   [START ON  02/07/2024] amphetamine -dextroamphetamine  (ADDERALL) 10 MG tablet, Take 1 tablet (10 mg total) by mouth every evening., Disp: 30 tablet, Rfl: 0   [START ON 03/08/2024] amphetamine -dextroamphetamine  (ADDERALL) 10 MG tablet, Take 1 tablet (10 mg total) by mouth every evening., Disp: 30 tablet, Rfl: 0   amphetamine -dextroamphetamine  (ADDERALL) 15 MG tablet, Take 1 tablet by mouth every morning, Disp: 30 tablet, Rfl: 0   [START ON 02/07/2024] amphetamine -dextroamphetamine  (ADDERALL) 15 MG tablet, Take 1 tablet by mouth every morning, Disp: 30 tablet, Rfl: 0   [START ON 03/08/2024] amphetamine -dextroamphetamine  (ADDERALL) 15 MG tablet, Take 1 tablet by mouth every morning, Disp: 30 tablet, Rfl: 0  Observations/Objective: Today's Vitals   01/08/24 1047  Weight: 169 lb (76.7 kg)  Height: 5' (1.524 m)   Physical Exam Vitals and nursing note reviewed.  Constitutional:      General: She is not in acute distress.    Appearance: Normal appearance. She is obese.  Pulmonary:     Effort: Pulmonary effort is normal. No respiratory distress.  Skin:    Capillary Refill: Capillary refill takes less than 2 seconds.  Neurological:     General: No focal deficit present.     Mental Status: She is alert and oriented to person, place, and time.     Cranial Nerves: No cranial nerve deficit.  Psychiatric:        Mood and Affect: Mood normal.        Behavior: Behavior normal.        Thought Content: Thought content normal.  Judgment: Judgment normal.     Assessment and Plan: Class 1 obesity due to excess calories with body mass index (BMI) of 33.0 to 33.9 in adult, unspecified whether serious comorbidity present Assessment & Plan: She is encouraged to strive for BMI less than 30 to decrease cardiac risk. Advised to aim for at least 150 minutes of exercise per week.    ADD (attention deficit disorder) without hyperactivity Assessment & Plan: Continues to do well with current regimen. I have  reviewed the PDMP during this encounter. 3 month supply sent to pharmacy with do not fill before instructions   Orders: -     Amphetamine -Dextroamphetamine ; Take 1 tablet (10 mg total) by mouth every evening.  Dispense: 30 tablet; Refill: 0 -     Amphetamine -Dextroamphetamine ; Take 1 tablet by mouth every morning  Dispense: 30 tablet; Refill: 0 -     Amphetamine -Dextroamphetamine ; Take 1 tablet (10 mg total) by mouth every evening.  Dispense: 30 tablet; Refill: 0 -     Amphetamine -Dextroamphetamine ; Take 1 tablet by mouth every morning  Dispense: 30 tablet; Refill: 0 -     Amphetamine -Dextroamphetamine ; Take 1 tablet (10 mg total) by mouth every evening.  Dispense: 30 tablet; Refill: 0 -     Amphetamine -Dextroamphetamine ; Take 1 tablet by mouth every morning  Dispense: 30 tablet; Refill: 0    Follow Up Instructions: Return in about 3 months (around 04/09/2024).   I discussed the assessment and treatment plan with the patient. The patient was provided an opportunity to ask questions, and all were answered. The patient agreed with the plan and demonstrated an understanding of the instructions.   The patient was advised to call back or seek an in-person evaluation if the symptoms worsen or if the condition fails to improve as anticipated.  The above assessment and management plan was discussed with the patient. The patient verbalized understanding of and has agreed to the management plan.   Eileen Gaines Ada, FNP, have reviewed all documentation for this visit. The documentation on 01/08/24 for the exam, diagnosis, procedures, and orders are all accurate and complete.

## 2024-01-08 NOTE — Assessment & Plan Note (Signed)
 She is encouraged to strive for BMI less than 30 to decrease cardiac risk. Advised to aim for at least 150 minutes of exercise per week.

## 2024-01-08 NOTE — Patient Instructions (Signed)
 I encourage you to increase your physical activity to at least 150 minutes per week. Also focus on a healthy diet.

## 2024-02-11 ENCOUNTER — Other Ambulatory Visit: Payer: Self-pay | Admitting: Nurse Practitioner

## 2024-02-11 DIAGNOSIS — M797 Fibromyalgia: Secondary | ICD-10-CM

## 2024-02-14 ENCOUNTER — Other Ambulatory Visit: Payer: Self-pay

## 2024-02-14 ENCOUNTER — Encounter: Payer: Self-pay | Admitting: Nurse Practitioner

## 2024-02-14 DIAGNOSIS — M797 Fibromyalgia: Secondary | ICD-10-CM

## 2024-02-14 MED ORDER — DULOXETINE HCL 30 MG PO CPEP: 30.0000 mg | ORAL_CAPSULE | Freq: Every day | ORAL | 1 refills | Status: AC

## 2024-04-11 ENCOUNTER — Other Ambulatory Visit: Payer: Self-pay | Admitting: Nurse Practitioner

## 2024-04-11 DIAGNOSIS — Z8709 Personal history of other diseases of the respiratory system: Secondary | ICD-10-CM
